# Patient Record
Sex: Female | Born: 1995 | Race: Black or African American | Hispanic: No | Marital: Married | Smoking: Never smoker
Health system: Southern US, Community
[De-identification: ages and names within clinical notes are randomized; demographics above are authoritative.]

## PROBLEM LIST (undated history)

## (undated) ENCOUNTER — Inpatient Hospital Stay (HOSPITAL_COMMUNITY): Payer: Self-pay

## (undated) DIAGNOSIS — D649 Anemia, unspecified: Secondary | ICD-10-CM

## (undated) DIAGNOSIS — O139 Gestational [pregnancy-induced] hypertension without significant proteinuria, unspecified trimester: Secondary | ICD-10-CM

## (undated) DIAGNOSIS — D219 Benign neoplasm of connective and other soft tissue, unspecified: Secondary | ICD-10-CM

## (undated) DIAGNOSIS — O2610 Low weight gain in pregnancy, unspecified trimester: Secondary | ICD-10-CM

## (undated) HISTORY — DX: Gestational (pregnancy-induced) hypertension without significant proteinuria, unspecified trimester: O13.9

## (undated) HISTORY — DX: Low weight gain in pregnancy, unspecified trimester: O26.10

---

## 2015-12-01 ENCOUNTER — Ambulatory Visit (HOSPITAL_COMMUNITY)
Admission: EM | Admit: 2015-12-01 | Discharge: 2015-12-01 | Disposition: A | Discharge status: Home / Self Care | Payer: Self-pay | Attending: Family Medicine | Admitting: Family Medicine

## 2015-12-01 ENCOUNTER — Encounter (HOSPITAL_COMMUNITY): Payer: Self-pay | Admitting: Emergency Medicine

## 2015-12-01 DIAGNOSIS — N739 Female pelvic inflammatory disease, unspecified: Secondary | ICD-10-CM | POA: Insufficient documentation

## 2015-12-01 DIAGNOSIS — N898 Other specified noninflammatory disorders of vagina: Secondary | ICD-10-CM | POA: Insufficient documentation

## 2015-12-01 DIAGNOSIS — N390 Urinary tract infection, site not specified: Secondary | ICD-10-CM | POA: Insufficient documentation

## 2015-12-01 LAB — POCT URINALYSIS DIP (DEVICE)
Bilirubin Urine: NEGATIVE
GLUCOSE, UA: NEGATIVE mg/dL
HGB URINE DIPSTICK: NEGATIVE
Ketones, ur: NEGATIVE mg/dL
NITRITE: NEGATIVE
Protein, ur: NEGATIVE mg/dL
Specific Gravity, Urine: 1.02 (ref 1.005–1.030)
UROBILINOGEN UA: 2 mg/dL — AB (ref 0.0–1.0)
pH: 7.5 (ref 5.0–8.0)

## 2015-12-01 LAB — POCT PREGNANCY, URINE: PREG TEST UR: NEGATIVE

## 2015-12-01 MED ORDER — CIPROFLOXACIN HCL 500 MG PO TABS: 500.0000 mg | ORAL_TABLET | Freq: Two times a day (BID) | ORAL | Status: DC

## 2015-12-01 MED ORDER — AZITHROMYCIN 250 MG PO TABS
1000.0000 mg | ORAL_TABLET | Freq: Once | ORAL | Status: AC
  Administered 2015-12-01: 1000 mg via ORAL

## 2015-12-01 MED ORDER — LIDOCAINE HCL (PF) 1 % IJ SOLN
INTRAMUSCULAR | Status: AC
  Filled 2015-12-01: qty 5

## 2015-12-01 MED ORDER — AZITHROMYCIN 250 MG PO TABS
ORAL_TABLET | ORAL | Status: AC
  Filled 2015-12-01: qty 4

## 2015-12-01 MED ORDER — CEFTRIAXONE SODIUM 250 MG IJ SOLR
INTRAMUSCULAR | Status: AC
  Filled 2015-12-01: qty 250

## 2015-12-01 MED ORDER — CEFTRIAXONE SODIUM 250 MG IJ SOLR
250.0000 mg | Freq: Once | INTRAMUSCULAR | Status: AC
  Administered 2015-12-01: 250 mg via INTRAMUSCULAR

## 2015-12-01 MED ORDER — PHENAZOPYRIDINE HCL 200 MG PO TABS: 200.0000 mg | ORAL_TABLET | Freq: Three times a day (TID) | ORAL | Status: DC

## 2015-12-01 NOTE — ED Provider Notes (Signed)
CSN: [id number]     Arrival date & time 12/01/15  1459 History   None    Chief Complaint  Patient presents with  . Vaginal Discharge   (Consider location/radiation/quality/duration/timing/severity/associated sxs/prior Treatment) HPI Comments: Patient states she has been having pain and tingling in her private area and discharge.  She states she had unprotected sex a week ago.  She states she noticed the discomfort after using bathroom 3 days ago.   Patient is a 20 y.o. female presenting with vaginal discharge. The history is provided by the patient.  Vaginal Discharge Quality:  Bloody and mucopurulent Severity:  Moderate Onset quality:  Sudden Duration:  3 days Timing:  Constant Progression:  Worsening Chronicity:  New Context: after intercourse and during urination   Relieved by:  Nothing Worsened by:  Nothing tried Ineffective treatments:  None tried   History reviewed. No pertinent past medical history. History reviewed. No pertinent past surgical history. No family history on file. Social History  Substance Use Topics  . Smoking status: Never Smoker   . Smokeless tobacco: None  . Alcohol Use: No   OB History    No data available     Review of Systems  Constitutional: Negative.   HENT: Negative.   Eyes: Negative.   Respiratory: Negative.   Cardiovascular: Negative.   Endocrine: Negative.   Genitourinary: Positive for vaginal discharge.  Musculoskeletal: Negative.   Skin: Negative.   Allergic/Immunologic: Negative.   Neurological: Negative.   Hematological: Negative.   Psychiatric/Behavioral: Negative.     Allergies  Review of patient's allergies indicates not on file.  Home Medications   Prior to Admission medications   Not on File   Meds Ordered and Administered this Visit  Medications - No data to display  BP 115/75 mmHg  Pulse 95  Temp(Src) 97.8 F (36.6 C)  Resp 14  SpO2 97%  LMP 11/10/2015 No data found.   Physical Exam   Constitutional: She is oriented to person, place, and time. She appears well-developed and well-nourished.  HENT:  Head: Normocephalic and atraumatic.  Right Ear: External ear normal.  Left Ear: External ear normal.  Mouth/Throat: Oropharynx is clear and moist.  Eyes: Conjunctivae are normal. Pupils are equal, round, and reactive to light.  Neck: Normal range of motion.  Cardiovascular: Normal rate, regular rhythm and normal heart sounds.   Pulmonary/Chest: Effort normal and breath sounds normal.  Abdominal: Soft. Bowel sounds are normal.  Genitourinary: Vaginal discharge found.  BUS normal.  Vaginal vault with brown foul smelling discharge.  Exam was difficult due to patient anxiety  And no chandallier sign but c/o discomfort with bimanual and speculum exam.  Musculoskeletal: Normal range of motion.  Neurological: She is alert and oriented to person, place, and time.    ED Course  Procedures (including critical care time)  Labs Review Labs Reviewed - No data to display  Imaging Review No results found.   Visual Acuity Review  Right Eye Distance:   Left Eye Distance:   Bilateral Distance:    Right Eye Near:   Left Eye Near:    Bilateral Near:         MDM  Vaginal Discharge PID UTI  Rocephin 250mg  IM Zithromax 1000mg  po now.  Tx UTI with cipro 500mg  po bid x 7 days GC/Chlamydia Wet Prep pending UA and UA preg    Lysbeth Penner, FNP 12/01/15 [phone]

## 2015-12-01 NOTE — Discharge Instructions (Signed)
Antibiotic Medicine °Antibiotic medicines are used to treat infections caused by bacteria. They work by hurting or killing the germs that are making you sick. °HOW WILL MY MEDICINE BE PICKED? °There are many kinds of antibiotic medicines. To help your doctor pick one, tell your doctor if: °· You have any allergies. °· You are pregnant or plan to get pregnant. °· You are breastfeeding. °· You are taking any medicines. These include over-the-counter medicines, prescription medicines, and herbal remedies. °· You have a medical condition or problem. °If you have questions about why your medicine was picked, ask. °FOR HOW LONG SHOULD I TAKE MY MEDICINE? °Take your medicine for as long as your doctor tells you to. Do not stop taking it when you feel better. If you stop taking it too soon: °· You may start to feel sick again. °· Your infection may get harder to treat. °· New problems may develop. °WHAT IF I MISS A DOSE? °Try not to miss any doses of antibiotic medicine. If you miss a dose: °· Take the dose as soon as you can. °· If you are taking 2 doses a day, take the next dose in 5 to 6 hours. °· If you are taking 3 or more doses a day, take the next dose in 2 to 4 hours. Then go back to the normal schedule. °If you cannot take a missed dose, take the next dose on time. Then take the missed dose after you have taken all the doses as told by your doctor, as if you had one more dose left. °DOES THIS MEDICINE AFFECT BIRTH CONTROL? °Birth control pills may not work while you are on antibiotic medicines. If you are taking birth control pills, keep taking them as usual. Use a second form of birth control, such as a condom. Keep using the second form of birth control until you are finished with your current 1 month cycle of birth control pills. °GET HELP IF: °· You get worse. °· You do not feel better a few days after starting the medicine. °· You throw up (vomit). °· There are white patches in your mouth. °· You have new  joint pain after starting the medicine. °· You have new muscle aches after starting the medicine. °· You had a fever before starting the medicine, and it comes back. °· You have any symptoms of an allergic reaction, such as an itchy rash. If this happens, stop taking the medicine. °GET HELP RIGHT AWAY IF: °· Your pee (urine) turns dark or becomes blood-colored. °· Your skin turns yellow. °· You bruise or bleed easily. °· You have very bad watery poop (diarrhea) and cramps in your belly (abdomen). °· You have a very bad headache. °· You have signs of a very bad allergic reaction, such as: °¨ Trouble breathing. °¨ Wheezing. °¨ Swelling of the lips, tongue, or face. °¨ Fainting. °¨ Blisters on the skin or in the mouth. °If you have signs of a very bad allergic reaction, stop taking the antibiotic medicine right away. °  °This information is not intended to replace advice given to you by your health care provider. Make sure you discuss any questions you have with your health care provider. °  °Document Released: 04/29/2008 Document Revised: 04/11/2015 Document Reviewed: 12/06/2014 °Elsevier Interactive Patient Education ©2016 Elsevier Inc. ° °

## 2015-12-01 NOTE — ED Notes (Signed)
Waiting 15 min. Due to antibiotic injection

## 2015-12-01 NOTE — ED Notes (Signed)
Pt. Stated, I have vaginal something going on.

## 2015-12-10 LAB — CERVICOVAGINAL ANCILLARY ONLY
Chlamydia: NEGATIVE
Neisseria Gonorrhea: NEGATIVE

## 2015-12-11 LAB — CERVICOVAGINAL ANCILLARY ONLY: Wet Prep (BD Affirm): POSITIVE — AB

## 2015-12-13 ENCOUNTER — Telehealth (HOSPITAL_COMMUNITY): Payer: Self-pay | Admitting: Emergency Medicine

## 2015-12-13 ENCOUNTER — Telehealth: Payer: Self-pay | Admitting: Internal Medicine

## 2015-12-13 NOTE — ED Notes (Signed)
LM on pt's VM [phone] Need to give lab results from recent visit on 4/29 Also let pt know labs can be obtained from [facility]  Per Dr. Valere Dross,  Clinical staff, please let patient know that test for candida (yeast) was positive.  Can send rx for fluconazole 150 mg po qday x 1 day, repeat dose in 3 days, #2 tabs no refill if having symptoms of vaginal irritation/discharge. Recheck for further evaluation if symptoms persist. LM

## 2015-12-13 NOTE — ED Notes (Signed)
Please let patient know that test for candida (yeast) was positive.  Can send rx for fluconazole 150 mg po qday x 1 day, repeat dose in 3 days, #2 tabs no refill if having symptoms of vaginal irritation/discharge. Recheck for further evaluation if symptoms persist.  LM  Sherlene Shams, MD 12/13/15 [phone]

## 2015-12-13 NOTE — ED Notes (Signed)
Pt called back Notified of recent lab results from visit 4/29 Pt ID'd properly... Reports feeling better and sx have subsided and no longer has vag itching  Per Dr. Valere Dross,  Clinical staff, please let patient know that test for candida (yeast) was positive.  Can send rx for fluconazole 150 mg po qday x 1 day, repeat dose in 3 days, #2 tabs no refill if having symptoms of vaginal irritation/discharge. Recheck for further evaluation if symptoms persist. LM  Adv pt if sx are not getting better to return  Pt verb understanding

## 2015-12-14 ENCOUNTER — Telehealth (HOSPITAL_COMMUNITY): Payer: Self-pay | Admitting: Emergency Medicine

## 2015-12-14 MED ORDER — FLUCONAZOLE 150 MG PO TABS: 150.0000 mg | ORAL_TABLET | Freq: Every day | ORAL | Status: DC

## 2015-12-14 NOTE — ED Notes (Signed)
Pt called today stating her sx came back today for vag itching after speaking w/her yest and she reported feeling better than.  Per her request, E-Rx medication to [facility])  Per Dr. Valere Dross,  Clinical staff, please let patient know that test for candida (yeast) was positive.  Can send rx for fluconazole 150 mg po qday x 1 day, repeat dose in 3 days, #2 tabs no refill if having symptoms of vaginal irritation/discharge. Recheck for further evaluation if symptoms persist. LM

## 2016-02-10 ENCOUNTER — Emergency Department (HOSPITAL_COMMUNITY)
Admission: EM | Admit: 2016-02-10 | Discharge: 2016-02-11 | Disposition: A | Discharge status: Home / Self Care | Payer: Self-pay | Attending: Physician Assistant | Admitting: Physician Assistant

## 2016-02-10 ENCOUNTER — Encounter (HOSPITAL_COMMUNITY): Payer: Self-pay | Admitting: Emergency Medicine

## 2016-02-10 ENCOUNTER — Emergency Department (HOSPITAL_COMMUNITY): Payer: Self-pay

## 2016-02-10 DIAGNOSIS — N83519 Torsion of ovary and ovarian pedicle, unspecified side: Secondary | ICD-10-CM

## 2016-02-10 DIAGNOSIS — D259 Leiomyoma of uterus, unspecified: Secondary | ICD-10-CM | POA: Insufficient documentation

## 2016-02-10 HISTORY — DX: Benign neoplasm of connective and other soft tissue, unspecified: D21.9

## 2016-02-10 LAB — URINALYSIS, ROUTINE W REFLEX MICROSCOPIC
BILIRUBIN URINE: NEGATIVE
Glucose, UA: NEGATIVE mg/dL
Hgb urine dipstick: NEGATIVE
KETONES UR: NEGATIVE mg/dL
Leukocytes, UA: NEGATIVE
NITRITE: NEGATIVE
PROTEIN: NEGATIVE mg/dL
Specific Gravity, Urine: 1.013 (ref 1.005–1.030)
pH: 7.5 (ref 5.0–8.0)

## 2016-02-10 LAB — COMPREHENSIVE METABOLIC PANEL
ALBUMIN: 3.8 g/dL (ref 3.5–5.0)
ALK PHOS: 33 U/L — AB (ref 38–126)
ALT: 16 U/L (ref 14–54)
ANION GAP: 7 (ref 5–15)
AST: 24 U/L (ref 15–41)
BUN: 10 mg/dL (ref 6–20)
CALCIUM: 9.5 mg/dL (ref 8.9–10.3)
CO2: 25 mmol/L (ref 22–32)
Chloride: 105 mmol/L (ref 101–111)
Creatinine, Ser: 0.77 mg/dL (ref 0.44–1.00)
GFR calc non Af Amer: >60 mL/min (ref >60)
GLUCOSE: 94 mg/dL (ref 65–99)
POTASSIUM: 3.4 mmol/L — AB (ref 3.5–5.1)
SODIUM: 137 mmol/L (ref 135–145)
TOTAL PROTEIN: 7.3 g/dL (ref 6.5–8.1)
Total Bilirubin: 0.7 mg/dL (ref 0.3–1.2)

## 2016-02-10 LAB — CBC
HCT: 40.7 % (ref 36.0–46.0)
HEMOGLOBIN: 13.6 g/dL (ref 12.0–15.0)
MCH: 29.8 pg (ref 26.0–34.0)
MCHC: 33.4 g/dL (ref 30.0–36.0)
MCV: 89.1 fL (ref 78.0–100.0)
PLATELETS: 247 10*3/uL (ref 150–400)
RBC: 4.57 MIL/uL (ref 3.87–5.11)
RDW: 14.2 % (ref 11.5–15.5)
WBC: 5.6 10*3/uL (ref 4.0–10.5)

## 2016-02-10 LAB — WET PREP, GENITAL
Clue Cells Wet Prep HPF POC: NONE SEEN
Trich, Wet Prep: NONE SEEN
Yeast Wet Prep HPF POC: NONE SEEN

## 2016-02-10 LAB — I-STAT BETA HCG BLOOD, ED (MC, WL, AP ONLY)

## 2016-02-10 LAB — LIPASE, BLOOD: LIPASE: 29 U/L (ref 11–51)

## 2016-02-10 MED ORDER — IOPAMIDOL (ISOVUE-300) INJECTION 61%
INTRAVENOUS | Status: AC
  Administered 2016-02-10: 100 mL
  Filled 2016-02-10: qty 100

## 2016-02-10 MED ORDER — KETOROLAC TROMETHAMINE 30 MG/ML IJ SOLN
30.0000 mg | Freq: Once | INTRAMUSCULAR | Status: AC
  Administered 2016-02-10: 30 mg via INTRAVENOUS
  Filled 2016-02-10: qty 1

## 2016-02-10 NOTE — ED Provider Notes (Signed)
CSN: [id number]     Arrival date & time 02/10/16  1826 History   First MD Initiated Contact with Patient 02/10/16 2030     Chief Complaint  Patient presents with  . Abdominal Pain  . Nausea     (Consider location/radiation/quality/duration/timing/severity/associated sxs/prior Treatment) HPI  Patient has a history of uterine fibroids but otherwise healthy presents to the ER for evaluation of RLQ abdominal pain. She also has not had he menstrual cycle for the past two months and she typically is very regular. She has had normal amount of white vaginal discharge. She denies having back pain, pelvic pain, chest pain, SOB, vomiting or diarrhea. She has been experiencing nausea. Her symptoms all started today. She has never had an [place] to evaluate fibroids, per pt. He has not had fevers, dysuria, sore throat, joint pains. No hx of abdominal surgery.  Past Medical History  Diagnosis Date  . Fibroids    History reviewed. No pertinent past surgical history. No family history on file. Social History  Substance Use Topics  . Smoking status: Never Smoker   . Smokeless tobacco: None  . Alcohol Use: No   OB History    No data available     Review of Systems  Review of Systems All other systems negative except as documented in the HPI. All pertinent positives and negatives as reviewed in the HPI.   Allergies  Review of patient's allergies indicates no known allergies.  Home Medications   Prior to Admission medications   Not on File   BP 110/55 mmHg  Pulse 70  Temp(Src) 98.6 F (37 C)  Resp 18  SpO2 99%  LMP 12/06/2015 Physical Exam  Constitutional: She appears well-developed and well-nourished. No distress.  HENT:  Head: Normocephalic and atraumatic.  Right Ear: Tympanic membrane and ear canal normal.  Left Ear: Tympanic membrane and ear canal normal.  Nose: Nose normal.  Mouth/Throat: Uvula is midline, oropharynx is clear and moist and mucous membranes are normal.  Eyes:  Pupils are equal, round, and reactive to light.  Neck: Normal range of motion. Neck supple.  Cardiovascular: Normal rate and regular rhythm.   Pulmonary/Chest: Effort normal.  Abdominal: Soft. Bowel sounds are normal. She exhibits no distension. There is tenderness in the right lower quadrant. There is no rebound, no guarding, no CVA tenderness and negative Murphy's sign.    No signs of abdominal distention  Genitourinary: Uterus normal. There is no rash or tenderness on the right labia. There is no rash or tenderness on the left labia. Cervix exhibits no motion tenderness. Right adnexum displays no mass and no tenderness. Left adnexum displays no mass and no tenderness. No tenderness or bleeding in the vagina. No foreign body around the vagina. Vaginal discharge (white) found.  Musculoskeletal:  No LE swelling  Neurological: She is alert.  Acting at baseline  Skin: Skin is warm and dry. No rash noted.  Nursing note and vitals reviewed.   ED Course  Procedures (including critical care time) Labs Review Labs Reviewed  WET PREP, GENITAL - Abnormal; Notable for the following:    WBC, Wet Prep HPF POC MANY (*)    All other components within normal limits  COMPREHENSIVE METABOLIC PANEL - Abnormal; Notable for the following:    Potassium 3.4 (*)    Alkaline Phosphatase 33 (*)    All other components within normal limits  LIPASE, BLOOD  CBC  URINALYSIS, ROUTINE W REFLEX MICROSCOPIC (NOT AT [facility])  I-STAT BETA HCG BLOOD, ED (  [facility], [facility], [facility] ONLY)  GC/CHLAMYDIA PROBE AMP ([facility]) NOT AT [facility]    Imaging Review US Transvaginal Non-ob  02/11/2016  CLINICAL DATA:  20 year old female with left lower quadrant abdominal pain EXAM: TRANSABDOMINAL AND TRANSVAGINAL ULTRASOUND OF PELVIS DOPPLER ULTRASOUND OF OVARIES TECHNIQUE: Both transabdominal and transvaginal ultrasound examinations of the pelvis were performed. Transabdominal technique was performed for global imaging of the pelvis including  uterus, ovaries, adnexal regions, and pelvic cul-de-sac. It was necessary to proceed with endovaginal exam following the transabdominal exam to visualize the endometrium and the ovaries. Color and duplex Doppler ultrasound was utilized to evaluate blood flow to the ovaries. COMPARISON:  CT dated 02/10/2016 FINDINGS: Uterus Measurements: 13.8 x 7.3 x 7.4 cm. There are multiple uterine fibroids some of which are partially calcified. The largest fibroid measures 9.6 x 6.9 x 10.0 cm. This fibroid is exophytic arising from the uterine fundus. There is a 2.8 x 2.7 x 2.7 cm rim calcified mural fibroid in the anterior uterine body. A 4.1 x 4.0 x 4.2 cm posterior uterine body fibroid noted. It is difficult to determine whether these fibroids have submucosal components. Endometrium Thickness: 12 mm.  No focal abnormality visualized. Right ovary Measurements: 5.6 x 2.5 x 3.9 cm. Normal appearance/no adnexal mass. Left ovary Measurements: 3.4 x 1.6 x 3.5 cm. Normal appearance/no adnexal mass. Pulsed Doppler evaluation of both ovaries demonstrates normal low-resistance arterial and venous waveforms. Other findings Small free fluid within the pelvis. IMPRESSION: Heterogeneous fibroid uterus. Unremarkable ovaries. Electronically Signed   By: Anner Crete M.D.   On: 02/11/2016 00:32   US Pelvis Complete  02/11/2016  CLINICAL DATA:  19 year old female with left lower quadrant abdominal pain EXAM: TRANSABDOMINAL AND TRANSVAGINAL ULTRASOUND OF PELVIS DOPPLER ULTRASOUND OF OVARIES TECHNIQUE: Both transabdominal and transvaginal ultrasound examinations of the pelvis were performed. Transabdominal technique was performed for global imaging of the pelvis including uterus, ovaries, adnexal regions, and pelvic cul-de-sac. It was necessary to proceed with endovaginal exam following the transabdominal exam to visualize the endometrium and the ovaries. Color and duplex Doppler ultrasound was utilized to evaluate blood flow to the  ovaries. COMPARISON:  CT dated 02/10/2016 FINDINGS: Uterus Measurements: 13.8 x 7.3 x 7.4 cm. There are multiple uterine fibroids some of which are partially calcified. The largest fibroid measures 9.6 x 6.9 x 10.0 cm. This fibroid is exophytic arising from the uterine fundus. There is a 2.8 x 2.7 x 2.7 cm rim calcified mural fibroid in the anterior uterine body. A 4.1 x 4.0 x 4.2 cm posterior uterine body fibroid noted. It is difficult to determine whether these fibroids have submucosal components. Endometrium Thickness: 12 mm.  No focal abnormality visualized. Right ovary Measurements: 5.6 x 2.5 x 3.9 cm. Normal appearance/no adnexal mass. Left ovary Measurements: 3.4 x 1.6 x 3.5 cm. Normal appearance/no adnexal mass. Pulsed Doppler evaluation of both ovaries demonstrates normal low-resistance arterial and venous waveforms. Other findings Small free fluid within the pelvis. IMPRESSION: Heterogeneous fibroid uterus. Unremarkable ovaries. Electronically Signed   By: Anner Crete M.D.   On: 02/11/2016 00:32   Ct Abdomen Pelvis W Contrast  02/10/2016  CLINICAL DATA:  Right lower quadrant and suprapubic abdominal pain. EXAM: CT ABDOMEN AND PELVIS WITH CONTRAST TECHNIQUE: Multidetector CT imaging of the abdomen and pelvis was performed using the standard protocol following bolus administration of intravenous contrast. CONTRAST:  165mL ISOVUE-300 IOPAMIDOL (ISOVUE-300) INJECTION 61% COMPARISON:  None. FINDINGS: The lung bases are clear. The liver, spleen, gallbladder, pancreas, adrenal glands, kidneys, abdominal  aorta, inferior vena cava, and retroperitoneal lymph nodes are unremarkable. Stomach, small bowel, and colon are not abnormally distended. No free air or free fluid in the abdomen. Small umbilical hernia containing fat. Pelvis: The appendix is normal. Small amount of free fluid in the pelvis is likely physiologic. Diffuse uterine enlargement with multiple uterine nodules, many with calcification. Appearance  is consistent with multiple uterine fibroids. Low-attenuation within some of the fibroids may indicate necrosis. Endometrium is somewhat prominent. Consider ultrasound for further evaluation. Bladder is decompressed. No pelvic lymphadenopathy. IMPRESSION: Multiple uterine fibroids, some with calcification. Prominence of the endometrium. Suggest ultrasound of the pelvis for further evaluation. Probable physiologic free fluid in the pelvis. Appendix is normal. Electronically Signed   By: Lucienne Capers M.D.   On: 02/10/2016 22:54   [place] Art/ven Flow Abd Pelv Doppler  02/11/2016  CLINICAL DATA:  20 year old female with left lower quadrant abdominal pain EXAM: TRANSABDOMINAL AND TRANSVAGINAL ULTRASOUND OF PELVIS DOPPLER ULTRASOUND OF OVARIES TECHNIQUE: Both transabdominal and transvaginal ultrasound examinations of the pelvis were performed. Transabdominal technique was performed for global imaging of the pelvis including uterus, ovaries, adnexal regions, and pelvic cul-de-sac. It was necessary to proceed with endovaginal exam following the transabdominal exam to visualize the endometrium and the ovaries. Color and duplex Doppler ultrasound was utilized to evaluate blood flow to the ovaries. COMPARISON:  CT dated 02/10/2016 FINDINGS: Uterus Measurements: 13.8 x 7.3 x 7.4 cm. There are multiple uterine fibroids some of which are partially calcified. The largest fibroid measures 9.6 x 6.9 x 10.0 cm. This fibroid is exophytic arising from the uterine fundus. There is a 2.8 x 2.7 x 2.7 cm rim calcified mural fibroid in the anterior uterine body. A 4.1 x 4.0 x 4.2 cm posterior uterine body fibroid noted. It is difficult to determine whether these fibroids have submucosal components. Endometrium Thickness: 12 mm.  No focal abnormality visualized. Right ovary Measurements: 5.6 x 2.5 x 3.9 cm. Normal appearance/no adnexal mass. Left ovary Measurements: 3.4 x 1.6 x 3.5 cm. Normal appearance/no adnexal mass. Pulsed Doppler  evaluation of both ovaries demonstrates normal low-resistance arterial and venous waveforms. Other findings Small free fluid within the pelvis. IMPRESSION: Heterogeneous fibroid uterus. Unremarkable ovaries. Electronically Signed   By: Anner Crete M.D.   On: 02/11/2016 00:32   I have personally reviewed and evaluated these images and lab results as part of my medical decision-making.   EKG Interpretation None      MDM   Final diagnoses:  Uterine leiomyoma, unspecified location    Patient has uncomplicated uterine fibroids, [facility], this is likely the source of her pain, she does not have torsion, PID, and her CT abd/pelv shows no acute findings.. She has many white blood cells on wet prep, given IM Rocephin and azithromycin to treat in case GC comes back positive.  Otherwise blood work is unremarkable, neg urine preg. Normal UA.  Patients pain controlled with IV Toradol.   Medications  cefTRIAXone (ROCEPHIN) injection 250 mg (not administered)  azithromycin (ZITHROMAX) tablet 1,000 mg (not administered)  ondansetron (ZOFRAN-ODT) disintegrating tablet 4 mg (not administered)  iopamidol (ISOVUE-300) 61 % injection (100 mLs  Contrast Given 02/10/16 2223)  ketorolac (TORADOL) 30 MG/ML injection 30 mg (30 mg Intravenous Given 02/10/16 2304)    I discussed results, diagnoses and plan with Ornela Mousseau. They voice there understanding and questions were answered. We discussed follow-up recommendations and return precautions.     Delos Haring, PA-C 02/11/16 [phone]  Courteney Julio Alm, MD 02/11/16 2310

## 2016-02-10 NOTE — ED Notes (Signed)
Pt c/o abdominal pain and nausea onset today. Pt reports that she has not had her period in two months.

## 2016-02-11 LAB — GC/CHLAMYDIA PROBE AMP (~~LOC~~) NOT AT ARMC
Chlamydia: NEGATIVE
NEISSERIA GONORRHEA: NEGATIVE

## 2016-02-11 MED ORDER — ONDANSETRON 4 MG PO TBDP
4.0000 mg | ORAL_TABLET | Freq: Once | ORAL | Status: AC
  Administered 2016-02-11: 4 mg via ORAL
  Filled 2016-02-11: qty 1

## 2016-02-11 MED ORDER — CEFTRIAXONE SODIUM 250 MG IJ SOLR
250.0000 mg | Freq: Once | INTRAMUSCULAR | Status: AC
  Administered 2016-02-11: 250 mg via INTRAMUSCULAR
  Filled 2016-02-11: qty 250

## 2016-02-11 MED ORDER — AZITHROMYCIN 250 MG PO TABS
1000.0000 mg | ORAL_TABLET | Freq: Once | ORAL | Status: AC
  Administered 2016-02-11: 1000 mg via ORAL
  Filled 2016-02-11: qty 4

## 2016-02-11 NOTE — Discharge Instructions (Signed)
Uterine Fibroids Uterine fibroids are tissue masses (tumors) that can develop in the womb (uterus). They are also called leiomyomas. This type of tumor is not cancerous (benign) and does not spread to other parts of the body outside of the pelvic area, which is between the hip bones. Occasionally, fibroids may develop in the fallopian tubes, in the cervix, or on the support structures (ligaments) that surround the uterus. You can have one or many fibroids. Fibroids can vary in size, weight, and where they grow in the uterus. Some can become quite large. Most fibroids do not require medical treatment. CAUSES A fibroid can develop when a single uterine cell keeps growing (replicating). Most cells in the human body have a control mechanism that keeps them from replicating without control. SIGNS AND SYMPTOMS Symptoms may include:   Heavy bleeding during your period.  Bleeding or spotting between periods.  Pelvic pain and pressure.  Bladder problems, such as needing to urinate more often (urinary frequency) or urgently.  Inability to reproduce offspring (infertility).  Miscarriages. DIAGNOSIS Uterine fibroids are diagnosed through a physical exam. Your health care provider may feel the lumpy tumors during a pelvic exam. Ultrasonography and an MRI may be done to determine the size, location, and number of fibroids. TREATMENT Treatment may include:  Watchful waiting. This involves getting the fibroid checked by your health care provider to see if it grows or shrinks. Follow your health care provider's recommendations for how often to have this checked.  Hormone medicines. These can be taken by mouth or given through an intrauterine device (IUD).  Surgery.  Removing the fibroids (myomectomy) or the uterus (hysterectomy).  Removing blood supply to the fibroids (uterine artery embolization). If fibroids interfere with your fertility and you want to become pregnant, your health care provider  may recommend having the fibroids removed.  HOME CARE INSTRUCTIONS  Keep all follow-up visits as directed by your health care provider. This is important.  Take medicines only as directed by your health care provider.  If you were prescribed a hormone treatment, take the hormone medicines exactly as directed.  Do not take aspirin, because it can cause bleeding.  Ask your health care provider about taking iron pills and increasing the amount of dark green, leafy vegetables in your diet. These actions can help to boost your blood iron levels, which may be affected by heavy menstrual bleeding.  Pay close attention to your period and tell your health care provider about any changes, such as:  Increased blood flow that requires you to use more pads or tampons than usual per month.  A change in the number of days that your period lasts per month.  A change in symptoms that are associated with your period, such as abdominal cramping or back pain. SEEK MEDICAL CARE IF:  You have pelvic pain, back pain, or abdominal cramps that cannot be controlled with medicines.  You have an increase in bleeding between and during periods.  You soak tampons or pads in a half hour or less.  You feel lightheaded, extra tired, or weak. SEEK IMMEDIATE MEDICAL CARE IF:  You faint.  You have a sudden increase in pelvic pain.   This information is not intended to replace advice given to you by your health care provider. Make sure you discuss any questions you have with your health care provider.   Document Released: 07/18/2000 Document Revised: 08/11/2014 Document Reviewed: 01/17/2014 Elsevier Interactive Patient Education 2016 [employer] Inc.  

## 2016-02-11 NOTE — ED Notes (Signed)
Pt stable, ambulatory, states understanding of discharge instructions 

## 2016-02-11 NOTE — ED Notes (Signed)
Pt provided with a meal, okay'd by PA

## 2016-05-19 ENCOUNTER — Encounter (HOSPITAL_COMMUNITY): Payer: Self-pay | Admitting: Emergency Medicine

## 2016-05-19 ENCOUNTER — Ambulatory Visit (HOSPITAL_COMMUNITY)
Admission: EM | Admit: 2016-05-19 | Discharge: 2016-05-19 | Disposition: A | Discharge status: Home / Self Care | Payer: Self-pay | Attending: Family Medicine | Admitting: Family Medicine

## 2016-05-19 DIAGNOSIS — K5900 Constipation, unspecified: Secondary | ICD-10-CM

## 2016-05-19 DIAGNOSIS — R110 Nausea: Secondary | ICD-10-CM

## 2016-05-19 DIAGNOSIS — Z3490 Encounter for supervision of normal pregnancy, unspecified, unspecified trimester: Secondary | ICD-10-CM

## 2016-05-19 DIAGNOSIS — Z3201 Encounter for pregnancy test, result positive: Secondary | ICD-10-CM

## 2016-05-19 LAB — POCT PREGNANCY, URINE: PREG TEST UR: POSITIVE — AB

## 2016-05-19 MED ORDER — PRENATAL VITAMIN 27-0.8 MG PO TABS: 1.0000 | ORAL_TABLET | Freq: Every day | ORAL | 12 refills | Status: DC

## 2016-05-19 NOTE — ED Triage Notes (Signed)
Patient presents for Abdominal pain for the last five days. She states no vomiting or diarrhrea. She states she has some constipation.

## 2016-05-19 NOTE — ED Provider Notes (Signed)
[place]    CSN: [id number] Arrival date & time: 05/19/16  [id number]     History   Chief Complaint Chief Complaint  Patient presents with  . Abdominal Pain    HPI Lisa Liu is a 20 y.o. female.   This is a 20 year old woman who comes from the [place]. She presents with abdominal fullness 5 days, missing period For over a month, and breast tenderness.  She has a boyfriend.      Past Medical History:  Diagnosis Date  . Fibroids     There are no active problems to display for this patient.   History reviewed. No pertinent surgical history.  OB History    No data available       Home Medications    Prior to Admission medications   Medication Sig Start Date End Date Taking? Authorizing Provider  Prenatal Vit-Fe Fumarate-FA (PRENATAL VITAMIN) 27-0.8 MG TABS Take 1 tablet by mouth daily. 05/19/16   Robyn Haber, MD    Family History History reviewed. No pertinent family history.  Social History Social History  Substance Use Topics  . Smoking status: Never Smoker  . Smokeless tobacco: Current User  . Alcohol use No     Allergies   Review of patient's allergies indicates no known allergies.   Review of Systems Review of Systems  Constitutional: Negative.   HENT: Negative.   Eyes: Negative.   Respiratory: Negative.   Cardiovascular: Negative.   Gastrointestinal: Positive for abdominal distention, constipation and nausea. Negative for vomiting.  Genitourinary: Negative.   Musculoskeletal: Negative.      Physical Exam Triage Vital Signs ED Triage Vitals  Enc Vitals Group     BP 05/19/16 2046 111/63     Pulse Rate 05/19/16 2046 100     Resp 05/19/16 2046 16     Temp 05/19/16 2046 99 F (37.2 C)     Temp Source 05/19/16 2046 Oral     SpO2 05/19/16 2046 100 %     Weight --      Height --      Head Circumference --      Peak Flow --      Pain Score 05/19/16 2107 7     Pain Loc --      Pain Edu? --      Excl. in  [place]? --    No data found.   Updated Vital Signs BP 111/63 (BP Location: Left Arm)   Pulse 100   Temp 99 F (37.2 C) (Oral)   Resp 16   SpO2 100%   Visual Acuity Right Eye Distance:   Left Eye Distance:   Bilateral Distance:    Right Eye Near:   Left Eye Near:    Bilateral Near:     Physical Exam  Constitutional: She is oriented to person, place, and time. She appears well-developed and well-nourished.  HENT:  Head: Normocephalic and atraumatic.  Right Ear: External ear normal.  Left Ear: External ear normal.  Mouth/Throat: Oropharynx is clear and moist.  Eyes: Conjunctivae are normal. Pupils are equal, round, and reactive to light.  Neck: Normal range of motion. Neck supple.  Cardiovascular: Normal rate, regular rhythm, normal heart sounds and intact distal pulses.   Pulmonary/Chest: Effort normal and breath sounds normal.  Abdominal: Soft. Bowel sounds are normal. There is tenderness.  Patient's fundus measures 18 weeks.  Neurological: She is alert and oriented to person, place, and time.  Skin: Skin is warm and dry.  Nursing note and vitals reviewed.    UC Treatments / Results  Labs (all labs ordered are listed, but only abnormal results are displayed) Labs Reviewed  POCT PREGNANCY, URINE - Abnormal; Notable for the following:       Result Value   Preg Test, Ur POSITIVE (*)    All other components within normal limits    EKG  EKG Interpretation None       Radiology No results found.  Procedures Procedures (including critical care time)  Medications Ordered in UC Medications - No data to display   Initial Impression / Assessment and Plan / UC Course  I have reviewed the triage vital signs and the nursing notes.  Pertinent labs & imaging results that were available during my care of the patient were reviewed by me and considered in my medical decision making (see chart for details).  Clinical Course      Final Clinical Impressions(s) / UC  Diagnoses   Final diagnoses:  Pregnancy, unspecified gestational age    [place] Prescriptions New Prescriptions   PRENATAL VIT-FE FUMARATE-FA (PRENATAL VITAMIN) 27-0.8 MG TABS    Take 1 tablet by mouth daily.     Robyn Haber, MD 05/19/16 2144

## 2016-05-24 ENCOUNTER — Encounter (HOSPITAL_COMMUNITY): Payer: Self-pay | Admitting: Emergency Medicine

## 2016-05-24 ENCOUNTER — Emergency Department (HOSPITAL_COMMUNITY): Payer: Self-pay

## 2016-05-24 ENCOUNTER — Emergency Department (HOSPITAL_COMMUNITY)
Admission: EM | Admit: 2016-05-24 | Discharge: 2016-05-25 | Disposition: A | Discharge status: Home / Self Care | Payer: Self-pay | Attending: Emergency Medicine | Admitting: Emergency Medicine

## 2016-05-24 DIAGNOSIS — R1032 Left lower quadrant pain: Secondary | ICD-10-CM | POA: Insufficient documentation

## 2016-05-24 DIAGNOSIS — O3680X0 Pregnancy with inconclusive fetal viability, not applicable or unspecified: Secondary | ICD-10-CM

## 2016-05-24 DIAGNOSIS — O26891 Other specified pregnancy related conditions, first trimester: Secondary | ICD-10-CM | POA: Insufficient documentation

## 2016-05-24 DIAGNOSIS — Z3A01 Less than 8 weeks gestation of pregnancy: Secondary | ICD-10-CM | POA: Insufficient documentation

## 2016-05-24 DIAGNOSIS — O26899 Other specified pregnancy related conditions, unspecified trimester: Secondary | ICD-10-CM

## 2016-05-24 DIAGNOSIS — R1031 Right lower quadrant pain: Secondary | ICD-10-CM | POA: Insufficient documentation

## 2016-05-24 DIAGNOSIS — N858 Other specified noninflammatory disorders of uterus: Secondary | ICD-10-CM | POA: Insufficient documentation

## 2016-05-24 DIAGNOSIS — O0281 Inappropriate change in quantitative human chorionic gonadotropin (hCG) in early pregnancy: Secondary | ICD-10-CM | POA: Insufficient documentation

## 2016-05-24 DIAGNOSIS — R109 Unspecified abdominal pain: Secondary | ICD-10-CM

## 2016-05-24 LAB — URINALYSIS, ROUTINE W REFLEX MICROSCOPIC
Bilirubin Urine: NEGATIVE
GLUCOSE, UA: NEGATIVE mg/dL
Hgb urine dipstick: NEGATIVE
KETONES UR: NEGATIVE mg/dL
LEUKOCYTES UA: NEGATIVE
Nitrite: NEGATIVE
PROTEIN: NEGATIVE mg/dL
Specific Gravity, Urine: 1.017 (ref 1.005–1.030)
pH: 6.5 (ref 5.0–8.0)

## 2016-05-24 LAB — CBC
HCT: 38.1 % (ref 36.0–46.0)
Hemoglobin: 13.3 g/dL (ref 12.0–15.0)
MCH: 30 pg (ref 26.0–34.0)
MCHC: 34.9 g/dL (ref 30.0–36.0)
MCV: 85.8 fL (ref 78.0–100.0)
PLATELETS: 298 10*3/uL (ref 150–400)
RBC: 4.44 MIL/uL (ref 3.87–5.11)
RDW: 14 % (ref 11.5–15.5)
WBC: 5.3 10*3/uL (ref 4.0–10.5)

## 2016-05-24 LAB — COMPREHENSIVE METABOLIC PANEL
ALK PHOS: 36 U/L — AB (ref 38–126)
ALT: 14 U/L (ref 14–54)
AST: 19 U/L (ref 15–41)
Albumin: 3.8 g/dL (ref 3.5–5.0)
Anion gap: 7 (ref 5–15)
BILIRUBIN TOTAL: 0.6 mg/dL (ref 0.3–1.2)
BUN: 7 mg/dL (ref 6–20)
CALCIUM: 9.6 mg/dL (ref 8.9–10.3)
CO2: 24 mmol/L (ref 22–32)
CREATININE: 0.72 mg/dL (ref 0.44–1.00)
Chloride: 105 mmol/L (ref 101–111)
Glucose, Bld: 96 mg/dL (ref 65–99)
Potassium: 3.6 mmol/L (ref 3.5–5.1)
Sodium: 136 mmol/L (ref 135–145)
TOTAL PROTEIN: 7.4 g/dL (ref 6.5–8.1)

## 2016-05-24 LAB — I-STAT BETA HCG BLOOD, ED (MC, WL, AP ONLY): I-stat hCG, quantitative: >2000 m[IU]/mL — ABNORMAL HIGH (ref <5)

## 2016-05-24 LAB — POC URINE PREG, ED: PREG TEST UR: POSITIVE — AB

## 2016-05-24 LAB — LIPASE, BLOOD: LIPASE: 31 U/L (ref 11–51)

## 2016-05-24 LAB — HCG, QUANTITATIVE, PREGNANCY: HCG, BETA CHAIN, QUANT, S: 7887 m[IU]/mL — AB (ref <5)

## 2016-05-24 NOTE — ED Triage Notes (Signed)
Pt complains of lower abd pain bilaterally for 5 days. Pain is the same that she was seen for at Urgent care but has not gotten any better.

## 2016-05-24 NOTE — ED Notes (Signed)
Unable to doppler HR

## 2016-05-24 NOTE — ED Provider Notes (Signed)
[place] DEPT Provider Note   CSN: [id number] Arrival date & time: 05/24/16  1810     History   Chief Complaint Chief Complaint  Patient presents with  . Abdominal Pain    HPI Lisa Liu is a 20 y.o. female.  HPI Reports 5 days of intermittent cramping abdominal pain. Cramping is mild to moderate in nature. No vaginal bleeding or vaginal discharge. No urinary symptoms, fevers. Does have some nausea and occasional vomiting. Appetite is normal. Went to urgent care 5 days ago and was diagnosed with pregnancy. Patient did not know she was pregnant prior to this. Reports her last menstrual period was September 10.  Past Medical History:  Diagnosis Date  . Fibroids     There are no active problems to display for this patient.   History reviewed. No pertinent surgical history.  OB History    No data available       Home Medications    Prior to Admission medications   Medication Sig Start Date End Date Taking? Authorizing Provider  Prenatal Vit-Fe Fumarate-FA (PRENATAL VITAMIN) 27-0.8 MG TABS Take 1 tablet by mouth daily. 05/19/16   Robyn Haber, MD    Family History No family history on file.  Social History Social History  Substance Use Topics  . Smoking status: Never Smoker  . Smokeless tobacco: Current User  . Alcohol use No     Allergies   Review of patient's allergies indicates no known allergies.   Review of Systems Review of Systems  Constitutional: Negative for fever.  HENT: Negative for sore throat.   Eyes: Negative for visual disturbance.  Respiratory: Negative for cough and shortness of breath.   Cardiovascular: Negative for chest pain.  Gastrointestinal: Positive for abdominal pain, constipation, nausea and vomiting. Negative for diarrhea.  Genitourinary: Negative for difficulty urinating, dysuria, vaginal bleeding and vaginal discharge.  Musculoskeletal: Negative for back pain and neck pain.  Skin: Negative for rash.    Neurological: Negative for syncope and headaches.     Physical Exam Updated Vital Signs BP 112/71   Pulse 88   Temp 98.5 F (36.9 C) (Oral)   Resp 18   Ht 5\' 5"  (1.651 m)   Wt 160 lb (72.6 kg)   LMP 04/13/2016 (Exact Date)   SpO2 100%   BMI 26.63 kg/m   Physical Exam  Constitutional: She is oriented to person, place, and time. She appears well-developed and well-nourished. No distress.  HENT:  Head: Normocephalic and atraumatic.  Eyes: Conjunctivae and EOM are normal.  Neck: Normal range of motion.  Cardiovascular: Normal rate, regular rhythm, normal heart sounds and intact distal pulses.  Exam reveals no gallop and no friction rub.   No murmur heard. Pulmonary/Chest: Effort normal and breath sounds normal. No respiratory distress. She has no wheezes. She has no rales.  Abdominal: Soft. She exhibits mass (suprapubic/uterine). She exhibits no distension. There is tenderness (mild bilateral lower). There is no CVA tenderness, no tenderness at McBurney's point and negative Murphy's sign.  Musculoskeletal: She exhibits no edema or tenderness.  Neurological: She is alert and oriented to person, place, and time.  Skin: Skin is warm and dry. No rash noted. She is not diaphoretic. No erythema.  Nursing note and vitals reviewed.    ED Treatments / Results  Labs (all labs ordered are listed, but only abnormal results are displayed) Labs Reviewed  COMPREHENSIVE METABOLIC PANEL - Abnormal; Notable for the following:       Result Value   Alkaline Phosphatase  36 (*)    All other components within normal limits  URINALYSIS, ROUTINE W REFLEX MICROSCOPIC (NOT AT [facility]) - Abnormal; Notable for the following:    APPearance CLOUDY (*)    All other components within normal limits  HCG, QUANTITATIVE, PREGNANCY - Abnormal; Notable for the following:    hCG, Beta Chain, Quant, S 7,887 (*)    All other components within normal limits  I-STAT BETA HCG BLOOD, ED ([facility], [facility], [facility] ONLY) - Abnormal;  Notable for the following:    I-stat hCG, quantitative >2,000.0 (*)    All other components within normal limits  POC URINE PREG, ED - Abnormal; Notable for the following:    Preg Test, Ur POSITIVE (*)    All other components within normal limits  LIPASE, BLOOD  CBC    EKG  EKG Interpretation None       Radiology US Ob Comp Less 14 Wks  Result Date: 05/24/2016 CLINICAL DATA:  Lower abdominal pain for week. Gestational age by last menstrual period 5 weeks and 6 days. Beta HCG 7,887. History of fibroids. EXAM: OBSTETRIC <14 WK [place] AND TRANSVAGINAL OB US TECHNIQUE: Both transabdominal and transvaginal ultrasound examinations were performed for complete evaluation of the gestation as well as the maternal uterus, adnexal regions, and pelvic cul-de-sac. Transvaginal technique was performed to assess early pregnancy. COMPARISON:  Pelvic ultrasound February 10, 2016 FINDINGS: Intrauterine gestational sac: Present. Yolk sac:  Not present Embryo:  Not present Cardiac Activity: Not present MSD: 8  mm   5 w   4 Subchorionic hemorrhage:  None visualized. Maternal uterus/adnexae: Multiple hypoechoic shadowing uterine masses measure 4.5 cm and were better characterized on prior pelvic ultrasound. 2 x 1.9 x 1.8 cm LEFT corpus luteal cyst. IMPRESSION: Probable early intrauterine gestational sac, but no yolk sac, fetal pole, or cardiac activity yet visualized. Recommend follow-up quantitative B-HCG levels and follow-up US in 14 days to confirm and assess viability. This recommendation follows SRU consensus guidelines: Diagnostic Criteria for Nonviable Pregnancy Early in the First Trimester. [employer] Med [id number]. Leiomyomatosis uterus. Electronically Signed   By: Elon Alas M.D.   On: 05/24/2016 22:45   US Ob Transvaginal  Result Date: 05/24/2016 CLINICAL DATA:  Lower abdominal pain for week. Gestational age by last menstrual period 5 weeks and 6 days. Beta HCG 7,887. History of fibroids. EXAM:  OBSTETRIC <14 WK [place] AND TRANSVAGINAL OB US TECHNIQUE: Both transabdominal and transvaginal ultrasound examinations were performed for complete evaluation of the gestation as well as the maternal uterus, adnexal regions, and pelvic cul-de-sac. Transvaginal technique was performed to assess early pregnancy. COMPARISON:  Pelvic ultrasound February 10, 2016 FINDINGS: Intrauterine gestational sac: Present. Yolk sac:  Not present Embryo:  Not present Cardiac Activity: Not present MSD: 8  mm   5 w   4 Subchorionic hemorrhage:  None visualized. Maternal uterus/adnexae: Multiple hypoechoic shadowing uterine masses measure 4.5 cm and were better characterized on prior pelvic ultrasound. 2 x 1.9 x 1.8 cm LEFT corpus luteal cyst. IMPRESSION: Probable early intrauterine gestational sac, but no yolk sac, fetal pole, or cardiac activity yet visualized. Recommend follow-up quantitative B-HCG levels and follow-up US in 14 days to confirm and assess viability. This recommendation follows SRU consensus guidelines: Diagnostic Criteria for Nonviable Pregnancy Early in the First Trimester. [employer] Med [id number]. Leiomyomatosis uterus. Electronically Signed   By: Elon Alas M.D.   On: 05/24/2016 22:45    Procedures Procedures (including critical care time)  Medications Ordered  in ED Medications - No data to display   Initial Impression / Assessment and Plan / ED Course  I have reviewed the triage vital signs and the nursing notes.  Pertinent labs & imaging results that were available during my care of the patient were reviewed by me and considered in my medical decision making (see chart for details).  Clinical Course   20 year old female presents with 5 days of cramping abdominal pain. Pregnancy test positive. Transvaginal ultrasound shows likely gestational sac, however no yolk sac or fetal pole. She has fibroids present and palpable on abdominal exam, as well as seen on ultrasound. Denies any vaginal  bleeding, vaginal discharge. We'll defer full pelvic exam in this setting to [facility] provider. Urinalysis shows no sign of urinary tract infection. History and exam is not consistent with cholecystitis, appendicitis, diverticulitis. Doubt ovarian torsion. Overall, patient's abdominal exam is benign, low suspicion for ruptured ectopic pregnancy at this time. Discussed with patient that this remains a pregnancy of unknown location, given absence of yolk sac or fetal pole, however it may be early intrauterine pregnancy. Recommended 48-hour follow-up hCG, 2 week [place].  Patient discharged in stable condition with understanding of reasons to return.   Final Clinical Impressions(s) / ED Diagnoses   Final diagnoses:  Pregnancy of unknown anatomic location, intrauterine gestation sac, suspect intrauterine pregnancy    New Prescriptions Discharge Medication List as of 05/24/2016 11:58 PM       Gareth Morgan, MD 05/25/16 [id number]

## 2016-05-24 NOTE — ED Notes (Signed)
Pt stable, ambulatory, states understanding of discharge instructions 

## 2016-05-24 NOTE — ED Notes (Signed)
Pt ambulated to bathroom without difficulty.

## 2016-06-30 ENCOUNTER — Other Ambulatory Visit (INDEPENDENT_AMBULATORY_CARE_PROVIDER_SITE_OTHER): Payer: Self-pay

## 2016-06-30 DIAGNOSIS — Z3481 Encounter for supervision of other normal pregnancy, first trimester: Secondary | ICD-10-CM

## 2016-06-30 LAB — POCT URINALYSIS DIPSTICK
Bilirubin, UA: NEGATIVE
Blood, UA: NEGATIVE
Glucose, UA: NEGATIVE
LEUKOCYTES UA: NEGATIVE
NITRITE UA: NEGATIVE
PH UA: 6
Spec Grav, UA: 1.025
Urobilinogen, UA: 0.2

## 2016-07-01 LAB — OBSTETRIC PANEL
ANTIBODY SCREEN: NEGATIVE
BASOS ABS: 0 {cells}/uL (ref 0–200)
Basophils Relative: 0 %
EOS PCT: 1 %
Eosinophils Absolute: 37 cells/uL (ref 15–500)
HEMATOCRIT: 41.6 % (ref 35.0–45.0)
HEMOGLOBIN: 13.9 g/dL (ref 11.7–15.5)
Hepatitis B Surface Ag: NEGATIVE
Lymphocytes Relative: 41 %
Lymphs Abs: 1517 cells/uL (ref 850–3900)
MCH: 29.7 pg (ref 27.0–33.0)
MCHC: 33.4 g/dL (ref 32.0–36.0)
MCV: 88.9 fL (ref 80.0–100.0)
MONOS PCT: 5 %
MPV: 9.1 fL (ref 7.5–12.5)
Monocytes Absolute: 185 cells/uL — ABNORMAL LOW (ref 200–950)
NEUTROS PCT: 53 %
Neutro Abs: 1961 cells/uL (ref 1500–7800)
Platelets: 251 10*3/uL (ref 140–400)
RBC: 4.68 MIL/uL (ref 3.80–5.10)
RDW: 14.7 % (ref 11.0–15.0)
RH TYPE: POSITIVE
Rubella: 31.9 Index — ABNORMAL HIGH (ref <0.90)
WBC: 3.7 10*3/uL — ABNORMAL LOW (ref 3.8–10.8)

## 2016-07-01 LAB — SICKLE CELL SCREEN: SICKLE CELL SCREEN: NEGATIVE

## 2016-07-01 LAB — HIV ANTIBODY (ROUTINE TESTING W REFLEX): HIV 1&2 Ab, 4th Generation: NONREACTIVE

## 2016-07-02 LAB — CULTURE, OB URINE

## 2016-07-07 ENCOUNTER — Ambulatory Visit (INDEPENDENT_AMBULATORY_CARE_PROVIDER_SITE_OTHER): Payer: Self-pay | Admitting: Internal Medicine

## 2016-07-07 ENCOUNTER — Encounter: Payer: Self-pay | Admitting: Internal Medicine

## 2016-07-07 DIAGNOSIS — Z3400 Encounter for supervision of normal first pregnancy, unspecified trimester: Secondary | ICD-10-CM | POA: Insufficient documentation

## 2016-07-07 DIAGNOSIS — Z3401 Encounter for supervision of normal first pregnancy, first trimester: Secondary | ICD-10-CM

## 2016-07-07 NOTE — Patient Instructions (Addendum)
It was so nice to meet you!  [facility], you should also get your flu shot there.  We will see you back in 4 weeks.  -Dr. Brett Albino

## 2016-07-07 NOTE — Progress Notes (Signed)
Lisa Liu is a 20 y.o. yo G1P0 at [redacted]w[redacted]d who presents for her initial prenatal visit. Pregnancy is not planned, but she is happy about it. She reports feeling well overall. She  is taking PNV. See flow sheet for details.  PMH, POBH, FH, meds, allergies and Social Hx reviewed.  Prenatal Exam: Gen: Well nourished, well developed.  No distress.  Vitals noted. HEENT: Normocephalic, atraumatic.  Neck supple without cervical lymphadenopathy, thyromegaly or thyroid nodules.  Fair dentition. CV: RRR no murmur, gallops or rubs Lungs: CTAB.  Normal respiratory effort without wheezes or rales. Abd: soft, NTND. +BS.  Firm mass palpated in the upper part of the uterus, uterus measuring 18cm because of the mass. GU: Normal external female genitalia without lesions.  Normal vaginal, well rugated without lesions. No vaginal discharge.  Bimanual exam: No adnexal mass or TTP. No CMT.  Uterus size 18cm, but Pt has multiple fibroids on [place]. Ext: No clubbing, cyanosis or edema. Psych: Normal grooming and dress.  Not depressed or anxious appearing.  Normal thought content and process without flight of ideas or looseness of associations.  Assessment & Plan: 1) 20 y.o. yo G1P0 at [redacted]w[redacted]d via LMP doing well.  -Current pregnancy issues include large fibroids present. Discussed with OB fellow and OB attending, who do not recommend any additional measures be taken. Pt is at slightly increased risk of miscarriage. She should get an MFM Anatomy US at 18 weeks instead of the [place] [facility] in case they need to do dopplers to make sure the placenta is not competing with the fibroid for blood flow. Baby is at increased risk of IUGR. -[place] from 10/21 showed probable early intrauterine gestational sac, but no yolk sac, fetal pole, or cardiac activity. Pt also had an US performed at the [facility], but Pt did not have that [place] report. She did have a paper stating that she should receive  routine prenatal care, which would imply that they were able to identify an IUP. I called over to their office but was unable to obtain records. No FHTs were able to be heard today, which may just be secondary to her fibroids. However, will send her for repeat US per the radiologist's recommendations and because we were unable to hear FHTs. Repeat US scheduled for 12/7. -Dating is reliable. -Prenatal labs reviewed and are unremarkable. -Genetic screening offered: declined. -Early glucola is not indicated.  -PHQ-9 and Pregnancy Medical Home forms completed and reviewed.  Bleeding and pain precautions reviewed. Importance of prenatal vitamins reviewed. Follow up in 4 weeks.

## 2016-07-08 ENCOUNTER — Other Ambulatory Visit (HOSPITAL_COMMUNITY): Admission: RE | Admit: 2016-07-08 | Payer: Self-pay | Source: Ambulatory Visit | Admitting: Family Medicine

## 2016-07-08 NOTE — Addendum Note (Signed)
Addended by: Dorna Bloom on: 07/08/2016 12:17 PM   Modules accepted: Orders

## 2016-07-09 ENCOUNTER — Ambulatory Visit: Payer: Self-pay

## 2016-07-09 LAB — CERVICOVAGINAL ANCILLARY ONLY
CHLAMYDIA, DNA PROBE: NEGATIVE
NEISSERIA GONORRHEA: NEGATIVE

## 2016-07-10 ENCOUNTER — Encounter (HOSPITAL_COMMUNITY): Payer: Self-pay | Admitting: Emergency Medicine

## 2016-07-10 ENCOUNTER — Ambulatory Visit (INDEPENDENT_AMBULATORY_CARE_PROVIDER_SITE_OTHER): Payer: Self-pay | Admitting: Student

## 2016-07-10 ENCOUNTER — Encounter: Payer: Self-pay | Admitting: Student

## 2016-07-10 ENCOUNTER — Emergency Department (HOSPITAL_COMMUNITY)
Admission: EM | Admit: 2016-07-10 | Discharge: 2016-07-10 | Disposition: A | Discharge status: Home / Self Care | Payer: Self-pay | Attending: Emergency Medicine | Admitting: Emergency Medicine

## 2016-07-10 ENCOUNTER — Other Ambulatory Visit: Payer: Self-pay | Admitting: Student

## 2016-07-10 VITALS — BP 102/66 | HR 103 | Temp 98.1°F | Wt 168.0 lb

## 2016-07-10 DIAGNOSIS — O021 Missed abortion: Secondary | ICD-10-CM

## 2016-07-10 DIAGNOSIS — R1030 Lower abdominal pain, unspecified: Secondary | ICD-10-CM | POA: Insufficient documentation

## 2016-07-10 DIAGNOSIS — Z3A12 12 weeks gestation of pregnancy: Secondary | ICD-10-CM | POA: Insufficient documentation

## 2016-07-10 DIAGNOSIS — R519 Headache, unspecified: Secondary | ICD-10-CM

## 2016-07-10 DIAGNOSIS — O26891 Other specified pregnancy related conditions, first trimester: Secondary | ICD-10-CM | POA: Insufficient documentation

## 2016-07-10 DIAGNOSIS — R51 Headache: Secondary | ICD-10-CM | POA: Insufficient documentation

## 2016-07-10 LAB — URINALYSIS, ROUTINE W REFLEX MICROSCOPIC
BILIRUBIN URINE: NEGATIVE
Glucose, UA: NEGATIVE mg/dL
Hgb urine dipstick: NEGATIVE
Ketones, ur: NEGATIVE mg/dL
Leukocytes, UA: NEGATIVE
NITRITE: NEGATIVE
Protein, ur: NEGATIVE mg/dL
SPECIFIC GRAVITY, URINE: 1.009 (ref 1.005–1.030)
pH: 6 (ref 5.0–8.0)

## 2016-07-10 LAB — COMPREHENSIVE METABOLIC PANEL
ALK PHOS: 40 U/L (ref 38–126)
ALT: 28 U/L (ref 14–54)
ANION GAP: 9 (ref 5–15)
AST: 25 U/L (ref 15–41)
Albumin: 3.8 g/dL (ref 3.5–5.0)
BILIRUBIN TOTAL: 0.3 mg/dL (ref 0.3–1.2)
BUN: 5 mg/dL — ABNORMAL LOW (ref 6–20)
CALCIUM: 9.8 mg/dL (ref 8.9–10.3)
CO2: 22 mmol/L (ref 22–32)
Chloride: 101 mmol/L (ref 101–111)
Creatinine, Ser: 0.7 mg/dL (ref 0.44–1.00)
Glucose, Bld: 94 mg/dL (ref 65–99)
Potassium: 3.4 mmol/L — ABNORMAL LOW (ref 3.5–5.1)
Sodium: 132 mmol/L — ABNORMAL LOW (ref 135–145)
TOTAL PROTEIN: 7.4 g/dL (ref 6.5–8.1)

## 2016-07-10 LAB — CBC
HCT: 40.7 % (ref 36.0–46.0)
HEMOGLOBIN: 14.2 g/dL (ref 12.0–15.0)
MCH: 30 pg (ref 26.0–34.0)
MCHC: 34.9 g/dL (ref 30.0–36.0)
MCV: 86 fL (ref 78.0–100.0)
Platelets: 278 10*3/uL (ref 150–400)
RBC: 4.73 MIL/uL (ref 3.87–5.11)
RDW: 14.3 % (ref 11.5–15.5)
WBC: 5.5 10*3/uL (ref 4.0–10.5)

## 2016-07-10 LAB — LIPASE, BLOOD: Lipase: 16 U/L (ref 11–51)

## 2016-07-10 LAB — I-STAT BETA HCG BLOOD, ED (MC, WL, AP ONLY)

## 2016-07-10 MED ORDER — SODIUM CHLORIDE 0.9 % IV BOLUS (SEPSIS)
1000.0000 mL | Freq: Once | INTRAVENOUS | Status: AC
  Administered 2016-07-10: 1000 mL via INTRAVENOUS

## 2016-07-10 MED ORDER — ACETAMINOPHEN 325 MG PO TABS
650.0000 mg | ORAL_TABLET | Freq: Once | ORAL | Status: AC
  Administered 2016-07-10: 650 mg via ORAL
  Filled 2016-07-10: qty 2

## 2016-07-10 NOTE — ED Triage Notes (Signed)
Pt states she is [redacted] week pregnant and has had a headache for a week. Pt having abd pain as well. Denies vaginal bleeding. No vomiting.

## 2016-07-10 NOTE — ED Provider Notes (Signed)
[place] DEPT Provider Note   CSN: [id number] Arrival date & time: 07/10/16  1619     History   Chief Complaint Chief Complaint  Patient presents with  . Abdominal Pain  . Headache    HPI Lisa Liu is a 20 y.o. female.  HPI Patient is [id number] [redacted]w[redacted]d female who presents with 1 day of mild abdominal cramping.  No vaginal bleeding or vaginal discharge.  Secondarily, she complains of frontal and bilateral mild headache x 1 week.  Gradual onset.  Similar to previous headaches.  No visual changes, slurred speech, numbness, weakness, gait abnormalities, neck stiffness, CP, or SOB.  She has not taken any medications PTA.  She states she [facility], but is unsure of the results.  Past Medical History:  Diagnosis Date  . Fibroids     Patient Active Problem List   Diagnosis Date Noted  . Supervision of normal first pregnancy 07/07/2016    History reviewed. No pertinent surgical history.  OB History    Gravida Para Term Preterm AB Living   1             SAB TAB Ectopic Multiple Live Births                   Home Medications    Prior to Admission medications   Medication Sig Start Date End Date Taking? Authorizing Provider  Prenatal Vit-Fe Fumarate-FA (PRENATAL VITAMIN) 27-0.8 MG TABS Take 1 tablet by mouth daily. 05/19/16  Yes Robyn Haber, MD    Family History History reviewed. No pertinent family history.  Social History Social History  Substance Use Topics  . Smoking status: Never Smoker  . Smokeless tobacco: Current User  . Alcohol use No     Allergies   Patient has no known allergies.   Review of Systems Review of Systems All other systems negative unless otherwise stated in HPI   Physical Exam Updated Vital Signs BP 124/92   Pulse 104   Temp 98 F (36.7 C) (Oral)   Resp 16   Wt 76.2 kg   LMP 04/13/2016 (Exact Date)   SpO2 100%   BMI 27.96 kg/m   Physical Exam  Constitutional:  She is oriented to person, place, and time. She appears well-developed and well-nourished.  Non-toxic appearance. She does not have a sickly appearance. She does not appear ill.  HENT:  Head: Normocephalic and atraumatic.  Mouth/Throat: Oropharynx is clear and moist.  Eyes: Conjunctivae are normal. Pupils are equal, round, and reactive to light.  Neck: Normal range of motion. Neck supple.  Cardiovascular: Normal rate and regular rhythm.   Pulmonary/Chest: Effort normal and breath sounds normal. No accessory muscle usage or stridor. No respiratory distress. She has no wheezes. She has no rhonchi. She has no rales.  Abdominal: Soft. Bowel sounds are normal. She exhibits no distension. There is no tenderness.  Musculoskeletal: Normal range of motion.  Lymphadenopathy:    She has no cervical adenopathy.  Neurological: She is alert and oriented to person, place, and time.  Cranial nerves grossly intact.  Normal finger to nose.  No pronator drift.  Equal strength and sensation throughout upper and lower extremities. Normal gait.   Skin: Skin is warm and dry.  Psychiatric: She has a normal mood and affect. Her behavior is normal.     ED Treatments / Results  Labs (all labs ordered are listed, but only abnormal results are displayed) Labs Reviewed  COMPREHENSIVE METABOLIC  PANEL - Abnormal; Notable for the following:       Result Value   Sodium 132 (*)    Potassium 3.4 (*)    BUN 5 (*)    All other components within normal limits  URINALYSIS, ROUTINE W REFLEX MICROSCOPIC - Abnormal; Notable for the following:    APPearance HAZY (*)    All other components within normal limits  I-STAT BETA HCG BLOOD, ED ([facility], [facility], [facility] ONLY) - Abnormal; Notable for the following:    I-stat hCG, quantitative >2,000.0 (*)    All other components within normal limits  LIPASE, BLOOD  CBC    EKG  EKG Interpretation None       Radiology No results found.  Procedures Procedures (including critical care  time)  Medications Ordered in ED Medications  sodium chloride 0.9 % bolus 1,000 mL (1,000 mLs Intravenous New Bag/Given 07/10/16 1751)  acetaminophen (TYLENOL) tablet 650 mg (650 mg Oral Given 07/10/16 1748)     Initial Impression / Assessment and Plan / ED Course  I have reviewed the triage vital signs and the nursing notes.  Pertinent labs & imaging results that were available during my care of the patient were reviewed by me and considered in my medical decision making (see chart for details).  Clinical Course    Patient seen by family medicine 12/4 for prenatal care.  [facility] showed likely missed abortion and she is scheduled for D&C.  Spoke with Family Medicine MD, Dr. Cyndia Skeeters, who had at length discussion with her regarding these results and will call her tomorrow for scheduling D&C.  In ED, abdomen is soft and benign.  No vaginal bleeding.  BP normotensive, no focal neurological deficits, low suspicion for eeclampsia, SAH, NPH, or other acute intracranial etiology.  Patient given Tylenol and IV fluids in ED. Evaluation does not show pathology requiring ongoing emergent intervention or admission. Pt is hemodynamically stable and mentating appropriately. Discussed findings/results and plan with patient/guardian, who agrees with plan. All questions answered. Return precautions discussed and outpatient follow up given.   Case has been discussed with Dr. Oleta Mouse who agrees with the above plan for discharge.   Final Clinical Impressions(s) / ED Diagnoses   Final diagnoses:  Lower abdominal pain  Nonintractable headache, unspecified chronicity pattern, unspecified headache type    New Prescriptions New Prescriptions   No medications on file     Gloriann Loan, PA-C 07/10/16 2001    Forde Dandy, MD 07/10/16 [phone]

## 2016-07-10 NOTE — Progress Notes (Signed)
Lisa Liu is a 20 y.o. G1P0 at [redacted]w[redacted]d by 5w [place] who presented to clinic as the same day appointment to discuss about her pregnancy. Patient was accompanied by her aunt.  Patient was seen in clinic for her initial OB visit three days ago. At that time, the FHT was not heard and a repeat ultrasound was ordered per radiologist's recommendation.  Patient had repeat ultrasound at [place] this morning. Per patient, she was directly sent to our clinic but was not told why. She says she was not told about the finding of the ultrasound. She denies vaginal bleeding, abdominal pain or abnormal vaginal discharge.  I called [facility] health Department to follow up on the ultrasound. I talked to Ms. Brookes (RN), who stated that the preliminary on the ultrasound shows pregnancy at [redacted]w[redacted]d without fetal heart activity. Ms. Lanice Schwab said she will have the ultrasound read stat by radiologist so that the results would be faxed to [place] as soon as possible.   I called OB attending at [facility], Dr. Ilda Basset and discussed about this finding. Dr. Ilda Basset suggested scheduling patient for [facility] next week. Per Dr. Ilda Basset, there is no indication for urgent intervention.   After discussing the above with Dr. Andria Frames, who was the preceptor for the day, we have decided to break the inevitable bad news to the patient. With the news, patient "fell" of her chair forward and started crying. There was no head injury or loss of consciousness. She was initially hard to console. Dr. Andria Frames also came in and talked to the patient. Asked if she likes to talk to someone. Patient declined stayed on the floor crying.   Finally, patient got up and sat on the floor with eyes red from crying. With patient's permission, I discussed the plan going forward: -We will schedule an appointment at [facility] hospital for next week for D&C. Once the schedule is made, I will personally call you to let you know about the  time.  -If you have severe abdominal pain like cramping, vaginal bleeding, fever or other symptoms concerning to you, please go to [facility] immediately.  Patient voiced understanding.   Patient's aunt asked if she can be referred tomorrow. I told her, we will try to make it fast but I can't promise. Unfortunately, our referral coordinator is not in the office to help [place] today but we will be able to know about the date and time by tomorrow.   I placed urgent referral to OB/GYN. Patient blood type A positive.   Later in the evening, It came to my attention that patient is in ED. I called ED and talked to [facility], PA who is taking care of the patient and gave the above information over the phone.

## 2016-07-10 NOTE — Progress Notes (Signed)
Progress Notes Date of Service: 07/10/2016 5:36 PM Mercy Riding, MD  Family Medicine    [] Hide copied text [] Hover for attribution information Lisa Liu is a 20 y.o. G1P0 at [redacted]w[redacted]d by 5w [place] who presented to clinic as the same day appointment to discuss about her pregnancy. Patient was accompanied by her aunt.  Patient was seen in clinic for her initial OB visit three days ago. At that time, the FHT was not heard and a repeat ultrasound was ordered per radiologist's recommendation.  Patient had repeat ultrasound at [place] this morning. Per patient, she was directly sent to our clinic but was not told why. She says she was not told about the finding of the ultrasound. She denies vaginal bleeding, abdominal pain or abnormal vaginal discharge.  I called [facility] health Department to follow up on the ultrasound. I talked to Ms. Brookes (RN), who stated that the preliminary on the ultrasound shows pregnancy at [redacted]w[redacted]d without fetal heart activity. Ms. Lanice Schwab said she will have the ultrasound read stat by radiologist so that the results would be faxed to [place] as soon as possible.   I called OB attending at [facility], Dr. Ilda Basset and discussed about this finding. Dr. Ilda Basset suggested scheduling patient for [facility] next week. Per Dr. Ilda Basset, there is no indication for urgent intervention.   After discussing the above with Dr. Andria Frames, who was the preceptor for the day, we have decided to break the inevitable bad news to the patient. With the news, patient "fell" of her chair forward and started crying. There was no head injury or loss of consciousness. She was initially hard to console. Dr. Andria Frames also came in and talked to the patient. Asked if she likes to talk to someone. Patient declined stayed on the floor crying.   Finally, patient got up and sat on the floor with eyes red from crying. With patient's permission, I discussed the plan going  forward: -We will schedule an appointment at [facility] hospital for next week for D&C. Once the schedule is made, I will personally call you to let you know about the time.  -If you have severe abdominal pain like cramping, vaginal bleeding, fever or other symptoms concerning to you, please go to [facility] immediately.  Patient voiced understanding.   Patient's aunt asked if she can be referred tomorrow. I told her, we will try to make it fast but I can't promise. Unfortunately, our referral coordinator is not in the office to help [place] today but we will be able to know about the date and time by tomorrow.   I placed urgent referral to OB/GYN. Patient blood type A positive.   Later in the evening, It came to my attention that patient is in ED. I called ED and talked to [facility], PA who is taking care of the patient and gave the above information over the phone.

## 2016-07-10 NOTE — Patient Instructions (Signed)
I am really sorry about the bad news!  We will call and schedule the appointment at [facility] to have this taken care. I will call you when the schedule is made. However, if you have vaginal bleeding, abdominal pain, fever or other symptoms concerning to you, please go to [facility] hospital immediately.     If we did any lab work today, and the results require attention, either me or my nurse will get in touch with you. If everything is normal, you will get a letter in mail. If you don't hear from [place] in two weeks, please give [place] a call. Otherwise, we look forward to seeing you again at your next visit. If you have any questions or concerns before then, please call the clinic at [phone].   Please bring all your medications to every doctors visit   Sign up for My Chart to have easy access to your labs results, and communication with your Primary care physician.     Please check-out at the front desk before leaving the clinic.

## 2016-07-10 NOTE — Discharge Instructions (Addendum)
Please follow up with your primary care physician.  They will call you tomorrow regarding your D&C.   If you develop severe abdominal pain, fever, or vaginal bleeding please go to the [facility].

## 2016-07-11 ENCOUNTER — Telehealth: Payer: Self-pay | Admitting: Student

## 2016-07-11 NOTE — Telephone Encounter (Signed)
Called to follow up on patient. Patient states "I feel better". She denies new symptoms such as vaginal bleeding, abdominal pain, fever or abnormal looking vaginal discharge. I told patient that the referral to OB/Gyn for D&C has already been ordered. I advised patient that someone will call to notify her about the date and time as soon as we here back from [facility] hospital which may be this afternoon or next week. Meanwhile, I advised patient to call to MAU if she has abdominal pain like cramping, vaginal bleeding, abnormal discharge, fever or other symptoms concerning to her. She voiced understanding and agreed to do as advised. She is appreciative about the call.

## 2016-07-14 ENCOUNTER — Telehealth: Payer: Self-pay

## 2016-07-14 ENCOUNTER — Inpatient Hospital Stay (HOSPITAL_COMMUNITY)
Admission: AD | Admit: 2016-07-14 | Discharge: 2016-07-15 | Disposition: A | Discharge status: Home / Self Care | Payer: Self-pay | Source: Ambulatory Visit | Attending: Family Medicine | Admitting: Family Medicine

## 2016-07-14 DIAGNOSIS — O034 Incomplete spontaneous abortion without complication: Secondary | ICD-10-CM

## 2016-07-14 DIAGNOSIS — O039 Complete or unspecified spontaneous abortion without complication: Secondary | ICD-10-CM | POA: Insufficient documentation

## 2016-07-14 MED ORDER — MISOPROSTOL 200 MCG PO TABS
800.0000 ug | ORAL_TABLET | Freq: Once | ORAL | Status: AC
  Administered 2016-07-14: 800 ug via BUCCAL
  Filled 2016-07-14: qty 4

## 2016-07-14 MED ORDER — TRAMADOL HCL 50 MG PO TABS: 50.0000 mg | ORAL_TABLET | Freq: Four times a day (QID) | ORAL | 0 refills | Status: DC | PRN

## 2016-07-14 MED ORDER — PROMETHAZINE HCL 25 MG PO TABS: 25.0000 mg | ORAL_TABLET | Freq: Four times a day (QID) | ORAL | 1 refills | Status: DC | PRN

## 2016-07-14 MED ORDER — TRAMADOL HCL 50 MG PO TABS
100.0000 mg | ORAL_TABLET | Freq: Once | ORAL | Status: AC
  Administered 2016-07-14: 100 mg via ORAL
  Filled 2016-07-14: qty 2

## 2016-07-14 NOTE — MAU Provider Note (Signed)
History    First Provider Initiated Contact with Patient 07/14/16 2233      Chief Complaint:  Miscarriage   Lisa Liu is  20 y.o. G1P0 Patient's last menstrual period was 04/13/2016 (exact date).. Patient is here for follow up of Diagnosis of missed AB diagnosed 07/10/2016 at [facility] and verified at [facility] 07/13/2016.  She measured [redacted] weeks gestation on those ultrasounds. Plan was made to schedule D&C at [facility] hospital, but was never scheduled. Patient went to [facility] 07/13/2016 for bleeding. Ultrasound confirmed fetal demise patient was told to follow-up at [facility] hospital. Patient return to [place] later during that same night (early on 07/14/2016) after passing tissue. Ultrasound was repeated showing no IUP, 3.1 cm endometrium with no definite products of conception. Patient states bleeding has decreased since then, is now moderate, but she is still having significant cramping. [place] for [facility] this afternoon to discuss scheduling D&C. Was referred to maternity admissions.  ROS Abdomin Pain: Moderate cramping, 8/10 on pain scale Vaginal bleeding: lighter than period.   Passage of clots or tissue: Yes Dizziness: None   Physical Exam   BP 124/68   Pulse 88   Temp 98.5 F (36.9 C) (Oral)   Resp 18   Ht 5' 4.5" (1.638 m)   Wt 166 lb 9.6 oz (75.6 kg)   LMP 04/13/2016 (Exact Date)   BMI 28.16 kg/m  Constitutional: Well-nourished female in no apparent distress. No pallor Neuro: Alert and oriented 4 Cardiovascular: Normal rate Respiratory: Normal effort and rate Abdomen: Soft, nontender Gynecological Exam: exam declined by the patient  Labs: A/POS/-- (11/27 0951) CBC this morning:  WBC 7.1  RBC 4.41  HGB 13.4  HCT 38.6  MCV 88  MCH 30.4  MCHC 34.7  Plt Ct 240  RDW SD 46.3  MPV 9.7     Ultrasound Studies:   No results found.  MAU course/MDM: Ultram  given.  Pain decreased to 5/10 on pain scale. No heavy bleeding in MAU.  - SAB with possible small amount of clots or tissue, but no indication for emergent D&C. Discussed history, exam, labs, ultrasound with Dr. Nehemiah Settle. Patient is a candidate for Cytotec. Discussed management options. Patient strongly prefers Cytotec--Given buccally in MAU. Patient is very concerned about completion of SAB and whether she is at risk for infection. Explained that she has no evidence of infection now, has likely passed most if not all of the tissue and Cytotec is very good at completing the process. Will schedule follow-up appointment 07/18/2016  Early Intrauterine Pregnancy Failure  _x_  Documented intrauterine pregnancy failure less than or equal to [redacted] weeks gestation  _x_  No serious current illness  _x_  Baseline Hgb greater than or equal to 10g/dl  _x_  Patient has easily accessible transportation to the hospital  _x_  Clear preference  _x_  Practitioner/physician deems patient reliable  _x_  Counseling by practitioner or physician  _x_  Patient education by RN  _x_  Consent form signed/Verbal consent given  _NA_  Rho-Gam given by RN if indicated  _x_ Medication dispensed   _x_   Cytotec 800 mcg     __   Intravaginally/Rectally/Bucally by patient at home     _x_  Bucally by RN in MAU  ___  Ibuprofen 600 mg 1 tablet by mouth every 6 hours as needed #30  ___  Oxycodone/acetaminophen 5/325 mg by mouth every 4 to 6 hours as needed  _x__  Phenergan 25 mg by mouth every 4 hours as needed for nausea  ___  Zofran 4 mg PO Q4 PRN  _x__ Ultram  Assessment: SAB, possibly incomplete, hemodynamically stable.  Plan: Discharge home in stable condition. Bleeding and infection precautions Support given.  Follow-up [place] for [place] Follow up on 07/18/2016.   Specialty:  Obstetrics and Gynecology Why:  at 8:40 am for follow-up appointment Contact  information: [place] Follow up.   Why:  as needed in pregnancy emergencies Contact information: [address] [id number] [place] [phone]           Medication List    TAKE these medications   Prenatal Vitamin 27-0.8 MG Tabs Take 1 tablet by mouth daily.   promethazine 25 MG tablet Commonly known as:  PHENERGAN Take 1 tablet (25 mg total) by mouth every 6 (six) hours as needed for nausea or vomiting.   traMADol 50 MG tablet Commonly known as:  ULTRAM Take 1-2 tablets (50-100 mg total) by mouth every 6 (six) hours as needed for severe pain.       Manya Silvas, [place] 07/15/2016, 3:33 AM  2/3

## 2016-07-14 NOTE — Discharge Instructions (Signed)

## 2016-07-14 NOTE — MAU Note (Signed)
Having some pain and some bleeding.  Was at [facility] yesterday, was given medicine for the pain. They confirmed it was miscarriage. Bleeding and pain has increased.

## 2016-07-14 NOTE — Telephone Encounter (Signed)
Received note from the front office in regards to pt calling and stated that Dr. Ilda Basset was suppose to schedule her a D&C and have not heard anything back.  Contacted pt and pt informed me that she went to [facility] ER because she started bleeding.  Pt stated that they did an [place] and gave her pain medication.  I asked pt if she was having any sx today.  Pt stated that she is still having some bleeding and she is having pain but not as bad as before.  Looking in pt's chart pt is not our patient but a patient of Family Medicine.  Pt stated that she told by the provider at [facility] that she would receive an appt with [facility].  Consulted with [employer], and advisement is to have pt go to MAU for evaluation and if a D&C is needed they would be able to provide.  I informed pt of advisement and to be NPO from the start of this conversation.  Pt stated understanding with no further questions.

## 2016-07-18 ENCOUNTER — Ambulatory Visit: Payer: Self-pay | Admitting: Clinical

## 2016-07-18 ENCOUNTER — Ambulatory Visit (INDEPENDENT_AMBULATORY_CARE_PROVIDER_SITE_OTHER): Payer: Self-pay | Admitting: Family Medicine

## 2016-07-18 ENCOUNTER — Encounter: Payer: Self-pay | Admitting: Family Medicine

## 2016-07-18 VITALS — BP 112/82 | HR 91 | Ht 63.0 in | Wt 165.5 lb

## 2016-07-18 DIAGNOSIS — O039 Complete or unspecified spontaneous abortion without complication: Secondary | ICD-10-CM

## 2016-07-18 DIAGNOSIS — D252 Subserosal leiomyoma of uterus: Secondary | ICD-10-CM

## 2016-07-18 DIAGNOSIS — D251 Intramural leiomyoma of uterus: Secondary | ICD-10-CM

## 2016-07-18 DIAGNOSIS — F419 Anxiety disorder, unspecified: Secondary | ICD-10-CM

## 2016-07-18 DIAGNOSIS — D259 Leiomyoma of uterus, unspecified: Secondary | ICD-10-CM | POA: Insufficient documentation

## 2016-07-18 NOTE — Progress Notes (Signed)
   Subjective:    Patient ID: Lisa Liu is a 20 y.o. female presenting with Miscarriage  on 07/18/2016  HPI: Patient is here for follow up of Diagnosis of missed AB diagnosed at [redacted] weeks gestation on ultrasound followed by spontaneous loss. Ultrasound was repeated showing no IUP, 3.1 cm endometrium with no definite products of conception. Given Cytotec in MAU. Still with minor cramping and bleeding.  Also, concerned about fibroids, as reason for miscarriage. Previously with fibroid as big as 10 cm, but looked smaller on more recent u/s. Reports heavy cycles.  Review of Systems  Constitutional: Negative for chills and fever.  Respiratory: Negative for shortness of breath.   Cardiovascular: Negative for chest pain.  Gastrointestinal: Negative for abdominal pain, nausea and vomiting.  Genitourinary: Positive for pelvic pain (cramping) and vaginal bleeding. Negative for dysuria.  Skin: Negative for rash.      Objective:    BP 112/82   Pulse 91   Ht 5\' 3"  (1.6 m)   Wt 165 lb 8 oz (75.1 kg)   LMP 04/13/2016 (Exact Date)   Breastfeeding? Unknown   BMI 29.32 kg/m  Physical Exam  Constitutional: She is oriented to person, place, and time. She appears well-developed and well-nourished. No distress.  HENT:  Head: Normocephalic and atraumatic.  Eyes: No scleral icterus.  Neck: Neck supple.  Cardiovascular: Normal rate.   Pulmonary/Chest: Effort normal.  Abdominal: Soft. She exhibits mass.  Genitourinary:  Genitourinary Comments: Large fibroids, 16 wk size and bulky  Neurological: She is alert and oriented to person, place, and time.  Skin: Skin is warm and dry.  Psychiatric: She has a normal mood and affect.        Assessment & Plan:   Problem List Items Addressed This Visit      Unprioritized   Fibroid uterus    Has heavy cycles--trying to get insurance--consideration of treatment given size and symptoms       Other Visit Diagnoses    Miscarriage    -   Primary   Likely complete-may attempt pregnancy as soon as desires.      Total face-to-face time with patient: 15 minutes. Over 50% of encounter was spent on counseling and coordination of care. Return in about 4 weeks (around 08/15/2016), or if symptoms worsen or fail to improve.  Donnamae Jude 07/18/2016 8:55 AM

## 2016-07-18 NOTE — Patient Instructions (Addendum)
Miscarriage A miscarriage is the loss of an unborn baby (fetus) before the 20th week of pregnancy. The cause is often unknown. Follow these instructions at home:  You may need to stay in bed (bed rest), or you may be able to do light activity. Go about activity as told by your doctor.  Have help at home.  Write down how many pads you use each day. Write down how soaked they are.  Do not use tampons. Do not wash out your vagina (douche) or have sex (intercourse) until your doctor approves.  Only take medicine as told by your doctor.  Do not take aspirin.  Keep all doctor visits as told.  If you or your partner have problems with grieving, talk to your doctor. You can also try counseling. Give yourself time to grieve before trying to get pregnant again. Get help right away if:  You have bad cramps or pain in your back or belly (abdomen).  You have a fever.  You pass large clumps of blood (clots) from your vagina that are walnut-sized or larger. Save the clumps for your doctor to see.  You pass large amounts of tissue from your vagina. Save the tissue for your doctor to see.  You have more bleeding.  You have thick, bad-smelling fluid (discharge) coming from the vagina.  You get lightheaded, weak, or you pass out (faint).  You have chills. This information is not intended to replace advice given to you by your health care provider. Make sure you discuss any questions you have with your health care provider. Document Released: 10/13/2011 Document Revised: 12/27/2015 Document Reviewed: 08/21/2011 Elsevier Interactive Patient Education  2017 Elsevier Inc.  Uterine Fibroids Uterine fibroids are tissue masses (tumors). They are also called leiomyomas. They can develop inside of a woman's womb (uterus). They can grow very large. Fibroids are not cancerous (benign). Most fibroids do not require medical treatment. Follow these instructions at home:  Keep all follow-up visits as told  by your doctor. This is important.  Take medicines only as told by your doctor.  If you were prescribed a hormone treatment, take the hormone medicines exactly as told.  Do not take aspirin. It can cause bleeding.  Ask your doctor about taking iron pills and increasing the amount of dark green, leafy vegetables in your diet. These actions can help to boost your blood iron levels.  Pay close attention to your period. Tell your doctor about any changes, such as:  Increased blood flow. This may require you to use more pads or tampons than usual per month.  A change in the number of days that your period lasts per month.  A change in symptoms that come with your period, such as back pain or cramping in your belly area (abdomen). Contact a doctor if:  You have pain in your back or the area between your hip bones (pelvic area) that is not controlled by medicines.  You have pain in your abdomen that is not controlled with medicines.  You have an increase in bleeding between and during periods.  You soak tampons or pads in a half hour or less.  You feel lightheaded.  You feel extra tired.  You feel weak. Get help right away if:  You pass out (faint).  You have a sudden increase in pelvic pain. This information is not intended to replace advice given to you by your health care provider. Make sure you discuss any questions you have with your health care provider.  Document Released: 08/23/2010 Document Revised: 03/21/2016 Document Reviewed: 01/17/2014 Elsevier Interactive Patient Education  2017 [employer].

## 2016-07-18 NOTE — BH Specialist Note (Signed)
Session Start time: 9:23   End Time: 9:33 Total Time:  10 minutes Type of Service: [place] Interpreter: No.   Interpreter Name & Language: n/a # [facility] Visits July 2017-June 2018: 1st  SUBJECTIVE: Lisa Liu is a 20 y.o. female  Pt. was referred by Dr. Kennon Rounds for:  anxiety. Pt. reports the following symptoms/concerns: Pt states her symptoms of anxiety in past week are attributed to recent miscarriage, and that symptoms have resolved; she copes with life stressors by talking to aunts.  Duration of problem:  2 days, resolved Severity: n/a Previous treatment: none  OBJECTIVE: Mood: Appropriate & Affect: Appropriate Risk of harm to self or others: No known risk of harm to self or others Assessments administered: PHQ9: 6/ GAD7: 10  LIFE CONTEXT:  Life changes: miscarriage 4 days prior  GOALS ADDRESSED:  -Maintain reduction of symptoms of anxiety  INTERVENTIONS: Strength-based and Supportive   ASSESSMENT:  Pt currently experiencing Anxious mood.  Pt may benefit from psychoeducation and brief therapeutic intervention regarding coping with symptoms of anxiety  PLAN: 1. F/U with behavioral health clinician: As needed 2. Behavioral Health meds: none 3. Behavioral recommendation: -Continue to call aunts if symptoms of anxiety increase -Consider reading educational material regarding coping with symptoms of anxiety 4. Referral: Brief Counseling/Psychotherapy and Psychoeducation  [place] Clinician  Geraldine Contras:   Warm Hand Off Completed.        Depression screen [facility] 2/9 07/18/2016 07/10/2016  Decreased Interest - 0  Down, Depressed, Hopeless 1 0  PHQ - 2 Score 1 0  Altered sleeping 1 -  Tired, decreased energy 2 -  Change in appetite 2 -  Feeling bad or failure about yourself  0 -  Trouble concentrating 0 -  Moving slowly or fidgety/restless 0 -  Suicidal thoughts 0 -  PHQ-9 Score 6 -   GAD 7 :  Generalized Anxiety Score 07/18/2016  Nervous, Anxious, on Edge 1  Control/stop worrying 2  Worry too much - different things 2  Trouble relaxing 1  Restless 1  Easily annoyed or irritable 1  Afraid - awful might happen 2  Total GAD 7 Score 10

## 2016-07-18 NOTE — Assessment & Plan Note (Addendum)
Has heavy cycles--trying to get insurance--consideration of treatment given size and symptoms

## 2016-07-24 ENCOUNTER — Encounter: Payer: Self-pay | Admitting: Obstetrics and Gynecology

## 2016-08-04 NOTE — L&D Delivery Note (Signed)
Delivery Note At 1:03 PM a viable female was delivered via Vaginal, Spontaneous Delivery (Presentation: ROA).  Over a 2nd degree perineal laceration.  APGAR: 9, 9; Delivered with no complications. Placenta status: intact, 3VC, delayed cord clamping performed.  Anesthesia:  Epidural, local Episiotomy: None Lacerations:  2nd degree perineal Suture Repair: 3.0 monocryl Est. Blood Loss (mL):  250  Fundus firm.  Mom to postpartum.  Baby to Couplet care / Skin to Skin.  Lisa Liu, SNM 05/12/2017, 1:35 PM  Patient is a G1 at [redacted]w[redacted]d who was admitted in latent labor/for IOL due to gHTN noted on admission, significant hx of vomiting consistently during preg with elevated LFTs one month ago.  She progressed with augmentation via Pitocin.  I was gloved and present for delivery in its entirety.  Second stage of labor progressed, baby delivered after 2 contractions.  No decels during second stage noted.  Complications: none  Lacerations: 2nd degree perineal  EBL: 250cc  Lisa Liu, CNM 1:59 PM 05/12/2017

## 2016-08-18 ENCOUNTER — Encounter: Payer: Self-pay | Admitting: Family Medicine

## 2016-08-18 ENCOUNTER — Ambulatory Visit (INDEPENDENT_AMBULATORY_CARE_PROVIDER_SITE_OTHER): Payer: Self-pay | Admitting: Family Medicine

## 2016-08-18 VITALS — BP 115/80 | HR 85 | Ht 65.0 in | Wt 164.0 lb

## 2016-08-18 DIAGNOSIS — D251 Intramural leiomyoma of uterus: Secondary | ICD-10-CM

## 2016-08-18 DIAGNOSIS — Z3169 Encounter for other general counseling and advice on procreation: Secondary | ICD-10-CM

## 2016-08-18 DIAGNOSIS — D252 Subserosal leiomyoma of uterus: Secondary | ICD-10-CM

## 2016-08-18 MED ORDER — PRENATAL VITAMIN 27-0.8 MG PO TABS: 1.0000 | ORAL_TABLET | Freq: Every day | ORAL | 12 refills | Status: DC

## 2016-08-18 NOTE — Addendum Note (Signed)
Addended by: Shelly Coss on: 08/18/2016 03:20 PM   Modules accepted: Orders

## 2016-08-18 NOTE — Assessment & Plan Note (Signed)
Re-eval in 4-5 months by u/s if not pregnant by then.

## 2016-08-18 NOTE — Progress Notes (Addendum)
   Subjective:    Patient ID: Lisa Liu is a 21 y.o. female presenting with Follow-up  on 08/18/2016  HPI: Here for f/u of SAB and fibroid.  Thinks she is starting her period. Denies heavy cycles. Pain is resolved. Has some dymenorrhea which is relieved with tylenol.  Review of Systems  Constitutional: Negative for chills and fever.  Respiratory: Negative for shortness of breath.   Cardiovascular: Negative for chest pain.  Gastrointestinal: Negative for abdominal pain, nausea and vomiting.  Genitourinary: Negative for dysuria.  Skin: Negative for rash.      Objective:    BP 115/80   Pulse 85   Ht 5\' 5"  (1.651 m)   Wt 164 lb (74.4 kg)   LMP 08/17/2016   BMI 27.29 kg/m  Physical Exam  Constitutional: She is oriented to person, place, and time. She appears well-developed and well-nourished. No distress.  HENT:  Head: Normocephalic and atraumatic.  Eyes: No scleral icterus.  Neck: Neck supple.  Cardiovascular: Normal rate.   Pulmonary/Chest: Effort normal.  Abdominal: Soft.  Neurological: She is alert and oriented to person, place, and time.  Skin: Skin is warm and dry.  Psychiatric: She has a normal mood and affect.        Assessment & Plan:   Problem List Items Addressed This Visit      Unprioritized   Fibroid uterus    Re-eval in 4-5 months by u/s if not pregnant by then.       Other Visit Diagnoses    Encounter for preconception consultation    -  Primary   advised to stay on PNV's, no tob, Etoh or drug while trying to conceive   Relevant Medications   Prenatal Vit-Fe Fumarate-FA (PRENATAL VITAMIN) 27-0.8 MG TABS      Total face-to-face time with patient: 10 minutes. Over 50% of encounter was spent on counseling and coordination of care. Return in about 3 months (around 11/16/2016).  Donnamae Jude 08/18/2016 1:42 PM

## 2016-08-18 NOTE — Patient Instructions (Addendum)
Uterine Fibroids Uterine fibroids are tissue masses (tumors). They are also called leiomyomas. They can develop inside of a woman's womb (uterus). They can grow very large. Fibroids are not cancerous (benign). Most fibroids do not require medical treatment. Follow these instructions at home:  Keep all follow-up visits as told by your doctor. This is important.  Take medicines only as told by your doctor.  If you were prescribed a hormone treatment, take the hormone medicines exactly as told.  Do not take aspirin. It can cause bleeding.  Ask your doctor about taking iron pills and increasing the amount of dark green, leafy vegetables in your diet. These actions can help to boost your blood iron levels.  Pay close attention to your period. Tell your doctor about any changes, such as:  Increased blood flow. This may require you to use more pads or tampons than usual per month.  A change in the number of days that your period lasts per month.  A change in symptoms that come with your period, such as back pain or cramping in your belly area (abdomen). Contact a doctor if:  You have pain in your back or the area between your hip bones (pelvic area) that is not controlled by medicines.  You have pain in your abdomen that is not controlled with medicines.  You have an increase in bleeding between and during periods.  You soak tampons or pads in a half hour or less.  You feel lightheaded.  You feel extra tired.  You feel weak. Get help right away if:  You pass out (faint).  You have a sudden increase in pelvic pain. This information is not intended to replace advice given to you by your health care provider. Make sure you discuss any questions you have with your health care provider. Document Released: 08/23/2010 Document Revised: 03/21/2016 Document Reviewed: 01/17/2014 Elsevier Interactive Patient Education  2017 [employer].  Preparing for Pregnancy If you are considering  becoming pregnant, make an appointment to see your regular health care provider to learn how to prepare for a safe and healthy pregnancy (preconception care). During a preconception care visit, your health care provider will:  Do a complete physical exam, including a Pap test.  Take a complete medical history.  Give you information, answer your questions, and help you resolve problems. Preconception checklist Medical history  Tell your health care provider about any current or past medical conditions. Your pregnancy or your ability to become pregnant may be affected by chronic conditions, such as diabetes, chronic hypertension, and thyroid problems.  Include your family's medical history as well as your partner's medical history.  Tell your health care provider about any history of STIs (sexually transmitted infections).These can affect your pregnancy. In some cases, they can be passed to your baby. Discuss any concerns that you have about STIs.  If indicated, discuss the benefits of genetic testing. This testing will show whether there are any genetic conditions that may be passed from you or your partner to your baby.  Tell your health care provider about:  Any problems you have had with conception or pregnancy.  Any medicines you take. These include vitamins, herbal supplements, and over-the-counter medicines.  Your history of immunizations. Discuss any vaccinations that you may need. Diet  Ask your health care provider what to include in a healthy diet that has a balance of nutrients. This is especially important when you are pregnant or preparing to become pregnant.  Ask your health care provider  to help you reach a healthy weight before pregnancy.  If you are overweight, you may be at higher risk for certain complications, such as high blood pressure, diabetes, and preterm birth.  If you are underweight, you are more likely to have a baby who has a low birth  weight. Lifestyle, work, and home  Let your health care provider know:  About any lifestyle habits that you have, such as alcohol use, drug use, or smoking.  About recreational activities that may put you at risk during pregnancy, such as downhill skiing and certain exercise programs.  Tell your health care provider about any international travel, especially any travel to places with an active [place] virus outbreak.  About harmful substances that you may be exposed to at work or at home. These include chemicals, pesticides, radiation, or even litter boxes.  If you do not feel safe at home. Mental health  Tell your health care provider about:  Any history of mental health conditions, including feelings of depression, sadness, or anxiety.  Any medicines that you take for a mental health condition. These include herbs and supplements. Home instructions to prepare for pregnancy  Lifestyle  Eat a balanced diet. This includes fresh fruits and vegetables, whole grains, lean meats, low-fat dairy products, healthy fats, and foods that are high in fiber. Ask to meet with a nutritionist or registered dietitian for assistance with meal planning and goals.  Get regular exercise. Try to be active for at least 30 minutes a day on most days of the week. Ask your health care provider which activities are safe during pregnancy.  Do not use any products that contain nicotine or tobacco, such as cigarettes and e-cigarettes. If you need help quitting, ask your health care provider.  Do not drink alcohol.  Do not take illegal drugs.  Maintain a healthy weight. Ask your health care provider what weight range is right for you. General instructions  Keep an accurate record of your menstrual periods. This makes it easier for your health care provider to determine your baby's due date.  Begin taking prenatal vitamins and folic acid supplements daily as directed by your health care provider.  Manage any  chronic conditions, such as high blood pressure and diabetes, as told by your health care provider. This is important. How do I know that I am pregnant? You may be pregnant if you have been sexually active and you miss your period. Symptoms of early pregnancy include:  Mild cramping.  Very light vaginal bleeding (spotting).  Feeling unusually tired.  Nausea and vomiting (morning sickness). If you have any of these symptoms and you suspect that you might be pregnant, you can take a home pregnancy test. These tests check for a hormone in your urine (human chorionic gonadotropin, or hCG). A woman's body begins to make this hormone during early pregnancy. These tests are very accurate. Wait until at least the first day after you miss your period to take one. If the test shows that you are pregnant (you get a positive result), call your health care provider to make an appointment for prenatal care. What should I do if I become pregnant?  Make an appointment with your health care provider as soon as you suspect you are pregnant.  Do not use any products that contain nicotine, such as cigarettes, chewing tobacco, and e-cigarettes. If you need help quitting, ask your health care provider.  Do not drink alcoholic beverages. Alcohol is related to a number of birth defects.  Avoid toxic odors and chemicals.  You may continue to have sexual intercourse if it does not cause pain or other problems, such as vaginal bleeding. This information is not intended to replace advice given to you by your health care provider. Make sure you discuss any questions you have with your health care provider. Document Released: 07/03/2008 Document Revised: 03/18/2016 Document Reviewed: 02/10/2016 Elsevier Interactive Patient Education  2017 [employer].

## 2016-09-09 ENCOUNTER — Ambulatory Visit (HOSPITAL_COMMUNITY)
Admission: EM | Admit: 2016-09-09 | Discharge: 2016-09-09 | Disposition: A | Discharge status: Home / Self Care | Payer: Self-pay | Attending: Family Medicine | Admitting: Family Medicine

## 2016-09-09 DIAGNOSIS — R112 Nausea with vomiting, unspecified: Secondary | ICD-10-CM

## 2016-09-09 LAB — POCT URINALYSIS DIP (DEVICE)
Bilirubin Urine: NEGATIVE
GLUCOSE, UA: NEGATIVE mg/dL
Hgb urine dipstick: NEGATIVE
Ketones, ur: NEGATIVE mg/dL
LEUKOCYTES UA: NEGATIVE
Nitrite: NEGATIVE
PROTEIN: NEGATIVE mg/dL
Specific Gravity, Urine: 1.02 (ref 1.005–1.030)
UROBILINOGEN UA: 0.2 mg/dL (ref 0.0–1.0)
pH: 7 (ref 5.0–8.0)

## 2016-09-09 LAB — POCT PREGNANCY, URINE: PREG TEST UR: NEGATIVE

## 2016-09-09 MED ORDER — ONDANSETRON 4 MG PO TBDP: 4.0000 mg | ORAL_TABLET | Freq: Three times a day (TID) | ORAL | 0 refills | Status: DC | PRN

## 2016-09-09 NOTE — Discharge Instructions (Signed)
Your pregnancy test was negative and your urine test does not show any signs or symptoms of infection. I have prescribed a medicine for nausea and vomiting called Zofran, take one tablet under the tongue every 8 hours as needed. Should your symptoms fail to resolve follow up with your primary care provider or return to clinic as needed.

## 2016-09-09 NOTE — ED Provider Notes (Signed)
CSN: [id number]     Arrival date & time 09/09/16  1546 History   None    Chief Complaint  Patient presents with  . Constipation   (Consider location/radiation/quality/duration/timing/severity/associated sxs/prior Treatment) 21 year old female presents to clinic with chief complaint of nausea and vomiting for last 4 days. She reports vomiting 4-5 times a day. She has had fluids but not eaten much over previous several days. Also states she has had some diarrhea and constipation however she had a normal bowel movement yesterday. She denies any abdominal pain, flank pain, or fever. She reports having had a miscarrage in December, she is under the care of an OB/GYN and has had a visit since her miscarriage. LMP 08/20/16. She reports some spotting today and requests pregnancy test.    The history is provided by the patient.  Constipation    Past Medical History:  Diagnosis Date  . Fibroids    No past surgical history on file. No family history on file. Social History  Substance Use Topics  . Smoking status: Never Smoker  . Smokeless tobacco: Current User  . Alcohol use No   OB History    Gravida Para Term Preterm AB Living   1             SAB TAB Ectopic Multiple Live Births                 Review of Systems  Reason unable to perform ROS: as covered in HPI.  Gastrointestinal: Positive for constipation.  All other systems reviewed and are negative.   Allergies  Patient has no known allergies.  Home Medications   Prior to Admission medications   Medication Sig Start Date End Date Taking? Authorizing Provider  ondansetron (ZOFRAN ODT) 4 MG disintegrating tablet Take 1 tablet (4 mg total) by mouth every 8 (eight) hours as needed for nausea or vomiting. 09/09/16   Barnet Glasgow, NP  Prenatal Vit-Fe Fumarate-FA (PRENATAL VITAMIN) 27-0.8 MG TABS Take 1 tablet by mouth daily. 08/18/16   Donnamae Jude, MD   Meds Ordered and Administered this Visit  Medications - No data to  display  BP 118/70 (BP Location: Right Arm)   Pulse 90   Temp 98.4 F (36.9 C) (Oral)   Resp 16   LMP 08/17/2016   SpO2 100%  No data found.   Physical Exam  Constitutional: She is oriented to person, place, and time. She appears well-developed and well-nourished. No distress.  HENT:  Head: Normocephalic.  Right Ear: External ear normal.  Left Ear: External ear normal.  Cardiovascular: Normal rate and regular rhythm.   Pulmonary/Chest: Effort normal and breath sounds normal.  Abdominal: Soft. Bowel sounds are normal. There is no hepatosplenomegaly. There is no tenderness. There is no rigidity, no rebound, no guarding, no CVA tenderness, no tenderness at McBurney's point and negative Murphy's sign.  Neurological: She is alert and oriented to person, place, and time.  Skin: Skin is warm and dry. Capillary refill takes less than 2 seconds. She is not diaphoretic.  Psychiatric: She has a normal mood and affect.  Nursing note and vitals reviewed.   Urgent Care Course     Procedures (including critical care time)  Labs Review Labs Reviewed  POCT URINALYSIS DIP (DEVICE)  POCT PREGNANCY, URINE    Imaging Review No results found.   Visual Acuity Review  Right Eye Distance:   Left Eye Distance:   Bilateral Distance:    Right Eye Near:  Left Eye Near:    Bilateral Near:         MDM   1. Non-intractable vomiting with nausea, unspecified vomiting type   Your pregnancy test was negative and your urine test does not show any signs or symptoms of infection. I have prescribed a medicine for nausea and vomiting called Zofran, take one tablet under the tongue every 8 hours as needed. Should your symptoms fail to resolve follow up with your primary care provider or return to clinic as needed.    Barnet Glasgow, NP 09/09/16 [phone]

## 2016-09-09 NOTE — ED Triage Notes (Signed)
C/o vomiting and constipation for four days No meds taking as tx

## 2016-09-23 ENCOUNTER — Ambulatory Visit (HOSPITAL_COMMUNITY)
Admission: EM | Admit: 2016-09-23 | Discharge: 2016-09-23 | Disposition: A | Discharge status: Home / Self Care | Payer: Self-pay | Attending: Family Medicine | Admitting: Family Medicine

## 2016-09-23 ENCOUNTER — Encounter (HOSPITAL_COMMUNITY): Payer: Self-pay | Admitting: Family Medicine

## 2016-09-23 DIAGNOSIS — R531 Weakness: Secondary | ICD-10-CM

## 2016-09-23 DIAGNOSIS — Z3201 Encounter for pregnancy test, result positive: Secondary | ICD-10-CM

## 2016-09-23 DIAGNOSIS — R112 Nausea with vomiting, unspecified: Secondary | ICD-10-CM

## 2016-09-23 DIAGNOSIS — O26811 Pregnancy related exhaustion and fatigue, first trimester: Secondary | ICD-10-CM

## 2016-09-23 LAB — POCT PREGNANCY, URINE: Preg Test, Ur: POSITIVE — AB

## 2016-09-23 MED ORDER — DOXYLAMINE-PYRIDOXINE 10-10 MG PO TBEC: DELAYED_RELEASE_TABLET | ORAL | 0 refills | Status: DC

## 2016-09-23 NOTE — Discharge Instructions (Signed)
Your pregnancy test is positive. His likely that this is causing most of your symptoms. Be sure to drink clear fluids and stay well-hydrated. There is a prescription written for you to help with nausea and vomiting. Call your OB/GYN doctor for an appointment. For any problems or worsening such is vaginal bleeding or pain see her doctor sooner. You have a palpable mass in your lower abdomen. I do not know if this is your fibroid or something else. You need to let your doctor know about this if he does not already.

## 2016-09-23 NOTE — ED Provider Notes (Signed)
CSN: [id number]     Arrival date & time 09/23/16  1454 History   None    Chief Complaint  Patient presents with  . Nausea  . Fatigue   (Consider location/radiation/quality/duration/timing/severity/associated sxs/prior Treatment) 21 year old female presents to the urgent care with complaints including feeling tired, vomiting intermittently for 2 weeks on a near daily basis. She has it with eating. At times when she is not vomiting she has some level of nausea. She also states her LMP was 08/18/2016 and states she is about 5 days late.      Past Medical History:  Diagnosis Date  . Fibroids    History reviewed. No pertinent surgical history. History reviewed. No pertinent family history. Social History  Substance Use Topics  . Smoking status: Never Smoker  . Smokeless tobacco: Current User  . Alcohol use No   OB History    Gravida Para Term Preterm AB Living   1             SAB TAB Ectopic Multiple Live Births                 Review of Systems  Constitutional: Positive for activity change and fatigue. Negative for fever.  HENT: Negative.   Respiratory: Negative.   Cardiovascular: Negative.   Gastrointestinal: Positive for nausea and vomiting.  Genitourinary: Negative.   Neurological: Negative.     Allergies  Patient has no known allergies.  Home Medications   Prior to Admission medications   Medication Sig Start Date End Date Taking? Authorizing Provider  Doxylamine-Pyridoxine 10-10 MG TBEC Take 2 first dose then 1 bid prn N or V. 09/23/16   Janne Napoleon, NP  Prenatal Vit-Fe Fumarate-FA (PRENATAL VITAMIN) 27-0.8 MG TABS Take 1 tablet by mouth daily. 08/18/16   Donnamae Jude, MD   Meds Ordered and Administered this Visit  Medications - No data to display  BP 107/72   Pulse 91   Temp 98.6 F (37 C)   Resp 18   LMP 08/18/2016   SpO2 100%  No data found.   Physical Exam  Constitutional: She is oriented to person, place, and time. She appears well-developed and  well-nourished. No distress.  HENT:  Head: Normocephalic and atraumatic.  Eyes: EOM are normal. No scleral icterus.  Neck: Normal range of motion.  Cardiovascular: Normal rate, regular rhythm, normal heart sounds and intact distal pulses.   Pulmonary/Chest: Effort normal and breath sounds normal. No respiratory distress. She has no wheezes. She has no rales.  Abdominal: Soft. Bowel sounds are normal. She exhibits mass. She exhibits no distension. There is no tenderness. There is no rebound and no guarding.  There is a 6 cm by approximately 7-8 cm firm mass in the abdomen just inferior and to the right of the umbilicus. It is slightly mobile. Nontender.  Musculoskeletal: She exhibits no edema.  Neurological: She is alert and oriented to person, place, and time. No cranial nerve deficit.  Skin: Skin is warm and dry.  Psychiatric: She has a normal mood and affect.  Nursing note and vitals reviewed.   Urgent Care Course     Procedures (including critical care time)  Labs Review Labs Reviewed  POCT PREGNANCY, URINE - Abnormal; Notable for the following:       Result Value   Preg Test, Ur POSITIVE (*)    All other components within normal limits    Imaging Review No results found.   Visual Acuity Review  Right Eye Distance:  Left Eye Distance:   Bilateral Distance:    Right Eye Near:   Left Eye Near:    Bilateral Near:         MDM   1. Weakness   2. Pregnancy related fatigue in first trimester   3. Positive urine pregnancy test   4. Non-intractable vomiting with nausea, unspecified vomiting type    Your pregnancy test is positive. His likely that this is causing most of your symptoms. Be sure to drink clear fluids and stay well-hydrated. There is a prescription written for you to help with nausea and vomiting. Call your OB/GYN doctor for an appointment. For any problems or worsening such is vaginal bleeding or pain see her doctor sooner. You have a palpable mass in  your lower abdomen. I do not know if this is your fibroid or something else. You need to let your doctor know about this if he does not already. Meds ordered this encounter  Medications  . Doxylamine-Pyridoxine 10-10 MG TBEC    Sig: Take 2 first dose then 1 bid prn N or V.    Dispense:  60 tablet    Refill:  0    Order Specific Question:   Supervising Provider    Answer:   Billy Fischer [[id number]]      Janne Napoleon, NP 09/23/16 1725

## 2016-09-23 NOTE — ED Triage Notes (Signed)
Pt here for Nausea,fatigue and headaches x 2 weeks.

## 2016-10-15 ENCOUNTER — Inpatient Hospital Stay (HOSPITAL_COMMUNITY)
Admission: AD | Admit: 2016-10-15 | Discharge: 2016-10-15 | Disposition: A | Discharge status: Home / Self Care | Payer: Self-pay | Source: Ambulatory Visit | Attending: Obstetrics & Gynecology | Admitting: Obstetrics & Gynecology

## 2016-10-15 ENCOUNTER — Inpatient Hospital Stay (HOSPITAL_COMMUNITY): Payer: Self-pay

## 2016-10-15 ENCOUNTER — Encounter (HOSPITAL_COMMUNITY): Payer: Self-pay | Admitting: *Deleted

## 2016-10-15 DIAGNOSIS — Z3169 Encounter for other general counseling and advice on procreation: Secondary | ICD-10-CM

## 2016-10-15 DIAGNOSIS — D259 Leiomyoma of uterus, unspecified: Secondary | ICD-10-CM | POA: Insufficient documentation

## 2016-10-15 DIAGNOSIS — O219 Vomiting of pregnancy, unspecified: Secondary | ICD-10-CM | POA: Diagnosis present

## 2016-10-15 DIAGNOSIS — Z3A01 Less than 8 weeks gestation of pregnancy: Secondary | ICD-10-CM | POA: Insufficient documentation

## 2016-10-15 DIAGNOSIS — O3411 Maternal care for benign tumor of corpus uteri, first trimester: Secondary | ICD-10-CM | POA: Insufficient documentation

## 2016-10-15 DIAGNOSIS — O3410 Maternal care for benign tumor of corpus uteri, unspecified trimester: Secondary | ICD-10-CM

## 2016-10-15 DIAGNOSIS — O210 Mild hyperemesis gravidarum: Secondary | ICD-10-CM | POA: Insufficient documentation

## 2016-10-15 LAB — URINALYSIS, ROUTINE W REFLEX MICROSCOPIC
Bilirubin Urine: NEGATIVE
GLUCOSE, UA: NEGATIVE mg/dL
Hgb urine dipstick: NEGATIVE
Ketones, ur: NEGATIVE mg/dL
LEUKOCYTES UA: NEGATIVE
Nitrite: NEGATIVE
PH: 6 (ref 5.0–8.0)
PROTEIN: NEGATIVE mg/dL
Specific Gravity, Urine: <1.005 — ABNORMAL LOW (ref 1.005–1.030)

## 2016-10-15 MED ORDER — PROMETHAZINE HCL 25 MG PO TABS: 25.0000 mg | ORAL_TABLET | Freq: Four times a day (QID) | ORAL | 2 refills | Status: DC | PRN

## 2016-10-15 MED ORDER — PRENATAL VITAMIN 27-0.8 MG PO TABS: 1.0000 | ORAL_TABLET | Freq: Every day | ORAL | 12 refills | Status: DC

## 2016-10-15 NOTE — Discharge Instructions (Signed)
Morning Sickness Morning sickness is when you feel sick to your stomach (nauseous) during pregnancy. This nauseous feeling may or may not come with vomiting. It often occurs in the morning but can be a problem any time of day. Morning sickness is most common during the first trimester, but it may continue throughout pregnancy. While morning sickness is unpleasant, it is usually harmless unless you develop severe and continual vomiting (hyperemesis gravidarum). This condition requires more intense treatment. What are the causes? The cause of morning sickness is not completely known but seems to be related to normal hormonal changes that occur in pregnancy. What increases the risk? You are at greater risk if you:  Experienced nausea or vomiting before your pregnancy.  Had morning sickness during a previous pregnancy.  Are pregnant with more than one baby, such as twins. How is this treated? Do not use any medicines (prescription, over-the-counter, or herbal) for morning sickness without first talking to your health care provider. Your health care provider may prescribe or recommend:  Vitamin B6 supplements.  Anti-nausea medicines.  The herbal medicine ginger. Follow these instructions at home:  Only take over-the-counter or prescription medicines as directed by your health care provider.  Taking multivitamins before getting pregnant can prevent or decrease the severity of morning sickness in most women.  Eat a piece of dry toast or unsalted crackers before getting out of bed in the morning.  Eat five or six small meals a day.  Eat dry and bland foods (rice, baked potato). Foods high in carbohydrates are often helpful.  Do not drink liquids with your meals. Drink liquids between meals.  Avoid greasy, fatty, and spicy foods.  Get someone to cook for you if the smell of any food causes nausea and vomiting.  If you feel nauseous after taking prenatal vitamins, take the vitamins at  night or with a snack.  Snack on protein foods (nuts, yogurt, cheese) between meals if you are hungry.  Eat unsweetened gelatins for desserts.  Wearing an acupressure wristband (worn for sea sickness) may be helpful.  Acupuncture may be helpful.  Do not smoke.  Get a humidifier to keep the air in your house free of odors.  Get plenty of fresh air. Contact a health care provider if:  Your home remedies are not working, and you need medicine.  You feel dizzy or lightheaded.  You are losing weight. Get help right away if:  You have persistent and uncontrolled nausea and vomiting.  You pass out (faint). This information is not intended to replace advice given to you by your health care provider. Make sure you discuss any questions you have with your health care provider. Document Released: 09/11/2006 Document Revised: 12/27/2015 Document Reviewed: 01/05/2013 Elsevier Interactive Patient Education  2017 Elsevier Inc.    Uterine Fibroids Uterine fibroids are tissue masses (tumors) that can develop in the womb (uterus). They are also called leiomyomas. This type of tumor is not cancerous (benign) and does not spread to other parts of the body outside of the pelvic area, which is between the hip bones. Occasionally, fibroids may develop in the fallopian tubes, in the cervix, or on the support structures (ligaments) that surround the uterus. You can have one or many fibroids. Fibroids can vary in size, weight, and where they grow in the uterus. Some can become quite large. Most fibroids do not require medical treatment. What are the causes? A fibroid can develop when a single uterine cell keeps growing (replicating). Most cells in  the human body have a control mechanism that keeps them from replicating without control. What are the signs or symptoms? Symptoms may include:  Heavy bleeding during your period.  Bleeding or spotting between periods.  Pelvic pain and  pressure.  Bladder problems, such as needing to urinate more often (urinary frequency) or urgently.  Inability to reproduce offspring (infertility).  Miscarriages. How is this diagnosed? Uterine fibroids are diagnosed through a physical exam. Your health care provider may feel the lumpy tumors during a pelvic exam. Ultrasonography and an MRI may be done to determine the size, location, and number of fibroids. How is this treated? Treatment may include:  Watchful waiting. This involves getting the fibroid checked by your health care provider to see if it grows or shrinks. Follow your health care provider's recommendations for how often to have this checked.  Hormone medicines. These can be taken by mouth or given through an intrauterine device (IUD).  Surgery.  Removing the fibroids (myomectomy) or the uterus (hysterectomy).  Removing blood supply to the fibroids (uterine artery embolization). If fibroids interfere with your fertility and you want to become pregnant, your health care provider may recommend having the fibroids removed. Follow these instructions at home:  Keep all follow-up visits as directed by your health care provider. This is important.  Take over-the-counter and prescription medicines only as told by your health care provider.  If you were prescribed a hormone treatment, take the hormone medicines exactly as directed.  Ask your health care provider about taking iron pills and increasing the amount of dark green, leafy vegetables in your diet. These actions can help to boost your blood iron levels, which may be affected by heavy menstrual bleeding.  Pay close attention to your period and tell your health care provider about any changes, such as:  Increased blood flow that requires you to use more pads or tampons than usual per month.  A change in the number of days that your period lasts per month.  A change in symptoms that are associated with your period,  such as abdominal cramping or back pain. Contact a health care provider if:  You have pelvic pain, back pain, or abdominal cramps that cannot be controlled with medicines.  You have an increase in bleeding between and during periods.  You soak tampons or pads in a half hour or less.  You feel lightheaded, extra tired, or weak. Get help right away if:  You faint.  You have a sudden increase in pelvic pain. This information is not intended to replace advice given to you by your health care provider. Make sure you discuss any questions you have with your health care provider. Document Released: 07/18/2000 Document Revised: 03/20/2016 Document Reviewed: 01/17/2014 Elsevier Interactive Patient Education  2017 [employer].

## 2016-10-15 NOTE — MAU Note (Addendum)
Pt C/O lower abd pain since yesterday, also pain with urination.  Denies bleeding.  Has had ongoing vomiting.  Hx of fibroids, also miscarriage in December.

## 2016-10-15 NOTE — MAU Provider Note (Signed)
[facility] Attending Note  History     CSN: [id number]  Arrival date & time 10/15/16  1644   First Provider Initiated Contact with Patient 10/15/16 1946      Chief Complaint  Patient presents with  . Abdominal Pain  . Dysuria    Lisa Liu is a 21 y.o. G2P0 at [redacted]w[redacted]d who presents to MAU today for evaluation of lower abdominal pain and pelvic pain and pressure with urination for the past 3-4 days.  Associated with vomiting. Was seen in ED on 09/23/16, prescribed Diclegis which she did not get due to cost. Was also noted to have fibroids during examination.  Of note, has SAB in 07/2016, she is nervous about this.  Denies any abnormal vaginal discharge, fevers, chills, sweats, other GI or GU symptoms or other general symptoms.   Obstetric History   G2   P0   T0   P0   A0   L0    SAB0   TAB0   Ectopic0   Multiple0   Live Births0     # Outcome Date GA Lbr Len/2nd Weight Sex Delivery Anes PTL Lv  2 Current           1 Gravida               Past Medical History:  Diagnosis Date  . Fibroids     No past surgical history on file.  No family history on file.  Social History  Substance Use Topics  . Smoking status: Never Smoker  . Smokeless tobacco: Current User  . Alcohol use No    No Known Allergies  Prescriptions Prior to Admission  Medication Sig Dispense Refill Last Dose  . [DISCONTINUED] Prenatal Vit-Fe Fumarate-FA (PRENATAL VITAMIN) 27-0.8 MG TABS Take 1 tablet by mouth daily. 30 tablet 12 10/14/2016 at Unknown time  . Doxylamine-Pyridoxine 10-10 MG TBEC Take 2 first dose then 1 bid prn N or V. (Patient not taking: Reported on 10/15/2016) 60 tablet 0 Not Taking at Unknown time     Physical Exam  BP 122/73 (BP Location: Right Arm)   Pulse 94   Temp 97.5 F (36.4 C) (Oral)   Resp 18   Ht 5\' 5"  (1.651 m)   Wt 172 lb (78 kg)   LMP 08/18/2016   BMI 28.62 kg/m  GENERAL: Well-developed, well-nourished female in no acute distress  SKIN:  Warm, dry and without erythema PSYCH: Normal mood and affect HEENT: Normocephalic, atraumatic.   LUNGS: Normal respiratory effort, normal breath sounds HEART: Regular rate noted ABDOMEN: Soft, nondistended, nontender,gravid fibroid uterus palpated. PELVIC: Deferred EXTREMITIES: No edema, no cyanosis, normal range of movement  MAU Course/MDM  UA done and ultrasound ordered  Labs and Imaging   Results for orders placed or performed during the hospital encounter of 10/15/16 (from the past 24 hour(s))  Urinalysis, Routine w reflex microscopic     Status: Abnormal   Collection Time: 10/15/16  5:30 PM  Result Value Ref Range   Color, Urine YELLOW YELLOW   APPearance CLEAR CLEAR   Specific Gravity, Urine <1.005 (L) 1.005 - 1.030   pH 6.0 5.0 - 8.0   Glucose, UA NEGATIVE NEGATIVE mg/dL   Hgb urine dipstick NEGATIVE NEGATIVE   Bilirubin Urine NEGATIVE NEGATIVE   Ketones, ur NEGATIVE NEGATIVE mg/dL   Protein, ur NEGATIVE NEGATIVE mg/dL   Nitrite NEGATIVE NEGATIVE   Leukocytes, UA NEGATIVE NEGATIVE   US Ob Comp Less 14 Wks  Result Date: 10/15/2016  CLINICAL DATA:  Lower abdominal pain for 1 day. EXAM: OBSTETRIC <14 WK ULTRASOUND TECHNIQUE: Transabdominal ultrasound was performed for evaluation of the gestation as well as the maternal uterus and adnexal regions. COMPARISON:  None. FINDINGS: Intrauterine gestational sac: Single Yolk sac:  Visualized Embryo:  Visualized Cardiac Activity: Visualized Heart Rate: 165 bpm MSD:   mm    w     d CRL:   15.5  mm   7 w 6 d                  [place] EDC: 05/28/2017 Subchorionic hemorrhage:  None visualized. Maternal uterus/adnexae: No adnexal mass or free fluid. Numerous fibroids throughout the uterus, the largest arising from the fundus measuring 10 cm. IMPRESSION: Seven week 6 day intrauterine pregnancy. Fetal heart rate 165 beats per minute. Numerous uterine fibroids, the largest in the fundus measuring 10 cm. Electronically Signed   By: Rolm Baptise M.D.   On:  10/15/2016 18:57    Assessment and Plan   1. Nausea and vomiting of pregnancy, antepartum   2. Uterine fibroids affecting pregnancy, antepartum     IUP at [redacted]w[redacted]d Phenergan prescribed for nausea; Tylenol recommended for pain. Pelvic pain likely 2/2 fibroids pressing on bladder, no evidence of UTI.  Patient told that fibroids can cause pain during pregnancy, may need further intervention if pain worsens.  Was told to return to MAU for any pain, bleeding or other concerns, or if her condition were to change or worsen. Discharged to home in stable condition, new OB visit scheduled at [facility] on 11/10/2016 at 0920.    Allergies as of 10/15/2016   No Known Allergies     Medication List    STOP taking these medications   Doxylamine-Pyridoxine 10-10 MG Tbec     TAKE these medications   Prenatal Vitamin 27-0.8 MG Tabs Take 1 tablet by mouth daily.   promethazine 25 MG tablet Commonly known as:  PHENERGAN Take 1 tablet (25 mg total) by mouth every 6 (six) hours as needed for nausea or vomiting.        Verita Schneiders, MD, [place] Attending [place], [facility] for [employer], [place]

## 2016-10-17 ENCOUNTER — Inpatient Hospital Stay (HOSPITAL_COMMUNITY)
Admission: AD | Admit: 2016-10-17 | Discharge: 2016-10-17 | Disposition: A | Discharge status: Home / Self Care | Payer: Self-pay | Source: Ambulatory Visit | Attending: Family Medicine | Admitting: Family Medicine

## 2016-10-17 DIAGNOSIS — O209 Hemorrhage in early pregnancy, unspecified: Secondary | ICD-10-CM | POA: Insufficient documentation

## 2016-10-17 DIAGNOSIS — O99331 Smoking (tobacco) complicating pregnancy, first trimester: Secondary | ICD-10-CM | POA: Insufficient documentation

## 2016-10-17 DIAGNOSIS — R1030 Lower abdominal pain, unspecified: Secondary | ICD-10-CM | POA: Insufficient documentation

## 2016-10-17 DIAGNOSIS — F17290 Nicotine dependence, other tobacco product, uncomplicated: Secondary | ICD-10-CM | POA: Insufficient documentation

## 2016-10-17 DIAGNOSIS — O3411 Maternal care for benign tumor of corpus uteri, first trimester: Secondary | ICD-10-CM | POA: Insufficient documentation

## 2016-10-17 DIAGNOSIS — O26891 Other specified pregnancy related conditions, first trimester: Secondary | ICD-10-CM | POA: Insufficient documentation

## 2016-10-17 DIAGNOSIS — Z3A08 8 weeks gestation of pregnancy: Secondary | ICD-10-CM | POA: Insufficient documentation

## 2016-10-17 LAB — CBC
HCT: 41.4 % (ref 36.0–46.0)
HEMOGLOBIN: 14.3 g/dL (ref 12.0–15.0)
MCH: 31 pg (ref 26.0–34.0)
MCHC: 34.5 g/dL (ref 30.0–36.0)
MCV: 89.6 fL (ref 78.0–100.0)
Platelets: 264 10*3/uL (ref 150–400)
RBC: 4.62 MIL/uL (ref 3.87–5.11)
RDW: 14.5 % (ref 11.5–15.5)
WBC: 5.3 10*3/uL (ref 4.0–10.5)

## 2016-10-17 LAB — HCG, QUANTITATIVE, PREGNANCY: hCG, Beta Chain, Quant, S: 153744 m[IU]/mL — ABNORMAL HIGH (ref <5)

## 2016-10-17 NOTE — MAU Note (Signed)
Kerry Hough PA in Triage to see pt. Bedside u/s done by PA and FHR noted and pt reassured. Pt then d/c home from Triage.

## 2016-10-17 NOTE — MAU Provider Note (Signed)
  History     CSN: [id number]  Arrival date and time: 10/17/16 9937   First Provider Initiated Contact with Patient 10/17/16 2023      Chief Complaint  Patient presents with  . Vaginal Bleeding   HPI [name] Coil is a 21 y.o. G2P0 at [redacted]w[redacted]d who presents to MAU today with complaint of vaginal bleeding. The patient was seen in MAU on 10/15/16 and had IUP on [place]. She states that tonight around 1730 she noted light brown spotting once with wiping. She denies bright red bleeding or clots. She has mild lower abdominal pain unchanged from her last visit. She was told she could take Tylenol, but has not felt she needed it.   OB History    Gravida Para Term Preterm AB Living   2             SAB TAB Ectopic Multiple Live Births                  Past Medical History:  Diagnosis Date  . Fibroids     No past surgical history on file.  No family history on file.  Social History  Substance Use Topics  . Smoking status: Never Smoker  . Smokeless tobacco: Current User  . Alcohol use No    Allergies: No Known Allergies  Prescriptions Prior to Admission  Medication Sig Dispense Refill Last Dose  . Prenatal Vit-Fe Fumarate-FA (PRENATAL VITAMIN) 27-0.8 MG TABS Take 1 tablet by mouth daily. 30 tablet 12   . promethazine (PHENERGAN) 25 MG tablet Take 1 tablet (25 mg total) by mouth every 6 (six) hours as needed for nausea or vomiting. 30 tablet 2     Review of Systems  Gastrointestinal: Positive for abdominal pain.  Genitourinary: Positive for vaginal bleeding. Negative for vaginal discharge.   Physical Exam   Blood pressure 127/73, pulse 91, temperature 98.3 F (36.8 C), resp. rate 18, height 5\' 5"  (1.651 m), weight 172 lb (78 kg), last menstrual period 08/18/2016, unknown if currently breastfeeding.  Physical Exam  Nursing note and vitals reviewed. Constitutional: She is oriented to person, place, and time. She appears well-developed and well-nourished. No distress.   HENT:  Head: Normocephalic and atraumatic.  Cardiovascular: Normal rate.   Respiratory: Effort normal.  GI: Soft. She exhibits no distension and no mass. There is no tenderness. There is no rebound and no guarding.  Neurological: She is alert and oriented to person, place, and time.  Skin: Skin is warm and dry. No erythema.  Psychiatric: She has a normal mood and affect.    MAU Course  Procedures  MDM [place] reviewed from last visit in MAU. SIUP with normal FHR noted on [place].  Bedside US performed today. Limitation of the exam explained to the patient. SIUP noted with normal appearing FHR.  Patient reassured Assessment and Plan  A: SIUP at [redacted]w[redacted]d Spotting in pregnancy, first trimester   P:  Discharge home Tylenol PRN for pain First trimester precautions discussed Patient advised to follow-up with [facility] as planned to start prenatal care Patient may return to MAU as needed or if her condition were to change or worsen  Luvenia Redden, PA-C  10/17/2016, 8:23 PM

## 2016-10-17 NOTE — MAU Note (Addendum)
Went to [place] about 1730 and saw brown on tissue. Mild amt cramping in lower abd but states she has had this off and on and is nothing new and no worse.

## 2016-10-17 NOTE — Discharge Instructions (Signed)
Pelvic Rest Pelvic rest may be recommended if:  Your placenta is partially or completely covering the opening of your cervix (placenta previa).  There is bleeding between the wall of the uterus and the amniotic sac in the first trimester of pregnancy (subchorionic hemorrhage).  You went into labor too early (preterm labor). Based on your overall health and the health of your baby, your health care provider will decide if pelvic rest is right for you. How do I rest my pelvis? For as long as told by your health care provider:  Do not have sex, sexual stimulation, or an orgasm.  Do not use tampons. Do not douche. Do not put anything in your vagina.  Do not lift anything that is heavier than 10 lb (4.5 kg).  Avoid activities that take a lot of effort (are strenuous).  Avoid any activity in which your pelvic muscles could become strained. When should I seek medical care? Seek medical care if you have:  Cramping pain in your lower abdomen.  Vaginal discharge.  A low, dull backache.  Regular contractions.  Uterine tightening. When should I seek immediate medical care? Seek immediate medical care if:  You have vaginal bleeding and you are pregnant. This information is not intended to replace advice given to you by your health care provider. Make sure you discuss any questions you have with your health care provider. Document Released: 11/15/2010 Document Revised: 12/27/2015 Document Reviewed: 01/22/2015 Elsevier Interactive Patient Education  2017 Elsevier Inc.   Vaginal Bleeding During Pregnancy, First Trimester A small amount of bleeding (spotting) from the vagina is common in early pregnancy. Sometimes the bleeding is normal and is not a problem, and sometimes it is a sign of something serious. Be sure to tell your doctor about any bleeding from your vagina right away. Follow these instructions at home:  Watch your condition for any changes.  Follow your doctor's  instructions about how active you can be.  If you are on bed rest:  You may need to stay in bed and only get up to use the bathroom.  You may be allowed to do some activities.  If you need help, make plans for someone to help you.  Write down:  The number of pads you use each day.  How often you change pads.  How soaked (saturated) your pads are.  Do not use tampons.  Do not douche.  Do not have sex or orgasms until your doctor says it is okay.  If you pass any tissue from your vagina, save the tissue so you can show it to your doctor.  Only take medicines as told by your doctor.  Do not take aspirin because it can make you bleed.  Keep all follow-up visits as told by your doctor. Contact a doctor if:  You bleed from your vagina.  You have cramps.  You have labor pains.  You have a fever that does not go away after you take medicine. Get help right away if:  You have very bad cramps in your back or belly (abdomen).  You pass large clots or tissue from your vagina.  You bleed more.  You feel light-headed or weak.  You pass out (faint).  You have chills.  You are leaking fluid or have a gush of fluid from your vagina.  You pass out while pooping (having a bowel movement). This information is not intended to replace advice given to you by your health care provider. Make sure you discuss any  questions you have with your health care provider. Document Released: 12/05/2013 Document Revised: 12/27/2015 Document Reviewed: 03/28/2013 Elsevier Interactive Patient Education  2017 [employer].

## 2016-11-07 ENCOUNTER — Ambulatory Visit (INDEPENDENT_AMBULATORY_CARE_PROVIDER_SITE_OTHER): Payer: Self-pay | Admitting: Obstetrics and Gynecology

## 2016-11-07 ENCOUNTER — Encounter: Payer: Self-pay | Admitting: Obstetrics and Gynecology

## 2016-11-07 VITALS — BP 121/75 | HR 96 | Wt 170.0 lb

## 2016-11-07 DIAGNOSIS — Z3493 Encounter for supervision of normal pregnancy, unspecified, third trimester: Secondary | ICD-10-CM | POA: Insufficient documentation

## 2016-11-07 DIAGNOSIS — Z3491 Encounter for supervision of normal pregnancy, unspecified, first trimester: Secondary | ICD-10-CM

## 2016-11-07 DIAGNOSIS — D259 Leiomyoma of uterus, unspecified: Secondary | ICD-10-CM

## 2016-11-07 DIAGNOSIS — Z113 Encounter for screening for infections with a predominantly sexual mode of transmission: Secondary | ICD-10-CM

## 2016-11-07 DIAGNOSIS — O219 Vomiting of pregnancy, unspecified: Secondary | ICD-10-CM

## 2016-11-07 DIAGNOSIS — Z124 Encounter for screening for malignant neoplasm of cervix: Secondary | ICD-10-CM

## 2016-11-07 DIAGNOSIS — O3410 Maternal care for benign tumor of corpus uteri, unspecified trimester: Secondary | ICD-10-CM

## 2016-11-07 NOTE — Progress Notes (Signed)
New OB Note  11/07/2016   Clinic: [facility]  Chief Complaint: NOB  Transfer of Care Patient: no  History of Present Illness: [name] is a 21 y.o. G2P0010 @ 11/4 weeks ([place] 10/22, based on Patient's last menstrual period was 08/18/2016.=7wk u/s.  Preg complicated by has Fibroid uterus; Uterine fibroids affecting pregnancy, antepartum; and Nausea and vomiting of pregnancy, antepartum on her problem list.   Any events prior to today's visit: went to MAU x 2, once for pain and once for VB. Pt is Rh pos and negative evaluation.  Her periods were: regular, monthly after last SAB She was using no method when she conceived.  She has Positive signs or symptoms of nausea/vomiting of pregnancy. She has Negative signs or symptoms of miscarriage or preterm labor On any different medications around the time she conceived/early pregnancy: No   ROS: A 12-point review of systems was performed and negative, except as stated in the above HPI.  OBGYN History: As per HPI. OB History  Gravida Para Term Preterm AB Living  2       1    SAB TAB Ectopic Multiple Live Births  1            # Outcome Date GA Lbr Len/2nd Weight Sex Delivery Anes PTL Lv  2 Current           1 SAB 07/2016 [redacted]w[redacted]d             Any issues with any prior pregnancies: not applicable Prior children are healthy, doing well, and without any problems or issues: not applicable History of pap smears: No. History of STIs: No   Past Medical History: Past Medical History:  Diagnosis Date  . Fibroids     Past Surgical History: No past surgical history on file.  Family History:  She denies any female cancers, bleeding or blood clotting disorders.  She denies any history of mental retardation, birth defects or genetic disorders in her or the FOB's history  Social History:  Social History   Social History  . Marital status: Single    Spouse name: N/A  . Number of children: N/A  . Years of education:  N/A   Occupational History  . Not on file.   Social History Main Topics  . Smoking status: Never Smoker  . Smokeless tobacco: Current User  . Alcohol use No  . Drug use: No  . Sexual activity: Yes    Birth control/ protection: None   Other Topics Concern  . Not on file   Social History Narrative  . No narrative on file     Allergy: No Known Allergies  Health Maintenance:  Mammogram Up to Date: not applicable  Current Outpatient Medications: PNV, phenergan PRN (pt not taking b/c it makes her feel dizzy)  Physical Exam:   BP 121/75   Pulse 96   Wt 170 lb (77.1 kg)   LMP 08/18/2016   BMI 28.29 kg/m  Body mass index is 28.29 kg/m. FHTs: normal by bedside u/s  General appearance: Well nourished, well developed female in no acute distress.  Neck:  Supple, normal appearance, and no thyromegaly  Cardiovascular: S1, S2 normal, no murmur, rub or gallop, regular rate and rhythm Respiratory:  Clear to auscultation bilateral. Normal respiratory effort Abdomen: positive bowel sounds and no masses, hernias; diffusely non tender to palpation, non distended Breasts: breasts appear normal, no suspicious masses, no skin or nipple changes or axillary nodes, and normal palpation.  Neuro/Psych:  Normal mood and affect.  Skin:  Warm and dry.  Lymphatic:  No inguinal lymphadenopathy.   Pelvic exam: is not limited by body habitus EGBUS: within normal limits, Vagina: within normal limits and with no blood in the vault, Cervix: normal appearing cervix without discharge or lesions, closed/long/high, Uterus:  enlarged, c/w 10-12 week size, and Adnexa:  normal adnexa and no mass, fullness, tenderness  Laboratory: No new labs. Prior [place] screen negative  Imaging:  As above  Assessment: pt doing well  Plan: 1. Encounter for supervision of normal pregnancy in first trimester, unspecified gravidity Routine care. Pt would like genetic screening. Will do quad screen nv. Set up anatomy u/s  then too. Pt w/o insurance. D/w her to try 12.5mg  of phenergan instead of 25 and see if less SE with that and also can do benadryl and vitamin B6 OTC - Cytology - PAP - SMN1 Copy Number Analysis - Cystic Fibrosis Mutation 97 - Obstetric Panel, Including HIV - Urine Culture  2. Fibroids Follow up at anatomy u/s and depending on size and/or location, consider serial growth u/s during pregnancy  Problem list reviewed and updated.  Follow up in 4 weeks.   >50% of 25 min visit spent on counseling and coordination of care.     Durene Romans MD Attending Center for [facility])

## 2016-11-08 LAB — URINE CULTURE

## 2016-11-10 ENCOUNTER — Encounter: Payer: Self-pay | Admitting: Family Medicine

## 2016-11-11 LAB — CYTOLOGY - PAP
Chlamydia: NEGATIVE
DIAGNOSIS: NEGATIVE
NEISSERIA GONORRHEA: NEGATIVE
TRICH (WINDOWPATH): NEGATIVE

## 2016-11-14 LAB — OBSTETRIC PANEL, INCLUDING HIV
Antibody Screen: NEGATIVE
BASOS ABS: 0 10*3/uL (ref 0.0–0.2)
Basos: 0 %
EOS (ABSOLUTE): 0 10*3/uL (ref 0.0–0.4)
EOS: 1 %
HEMOGLOBIN: 13.5 g/dL (ref 11.1–15.9)
HEP B S AG: NEGATIVE
HIV Screen 4th Generation wRfx: NONREACTIVE
Hematocrit: 40.7 % (ref 34.0–46.6)
IMMATURE GRANS (ABS): 0 10*3/uL (ref 0.0–0.1)
IMMATURE GRANULOCYTES: 0 %
LYMPHS: 25 %
Lymphocytes Absolute: 1.2 10*3/uL (ref 0.7–3.1)
MCH: 29.5 pg (ref 26.6–33.0)
MCHC: 33.2 g/dL (ref 31.5–35.7)
MCV: 89 fL (ref 79–97)
MONOCYTES: 9 %
Monocytes Absolute: 0.4 10*3/uL (ref 0.1–0.9)
NEUTROS PCT: 65 %
Neutrophils Absolute: 3 10*3/uL (ref 1.4–7.0)
PLATELETS: 237 10*3/uL (ref 150–379)
RBC: 4.57 x10E6/uL (ref 3.77–5.28)
RDW: 14.3 % (ref 12.3–15.4)
RPR: NONREACTIVE
Rh Factor: POSITIVE
WBC: 4.7 10*3/uL (ref 3.4–10.8)

## 2016-11-14 LAB — SMN1 COPY NUMBER ANALYSIS (SMA CARRIER SCREENING)

## 2016-11-14 LAB — CYSTIC FIBROSIS MUTATION 97: Interpretation: NOT DETECTED

## 2016-11-17 ENCOUNTER — Inpatient Hospital Stay (HOSPITAL_COMMUNITY)
Admission: AD | Admit: 2016-11-17 | Discharge: 2016-11-18 | Disposition: A | Discharge status: Home / Self Care | Payer: Self-pay | Source: Ambulatory Visit | Attending: Obstetrics and Gynecology | Admitting: Obstetrics and Gynecology

## 2016-11-17 ENCOUNTER — Encounter (HOSPITAL_COMMUNITY): Payer: Self-pay

## 2016-11-17 DIAGNOSIS — B9789 Other viral agents as the cause of diseases classified elsewhere: Secondary | ICD-10-CM

## 2016-11-17 DIAGNOSIS — Z3A13 13 weeks gestation of pregnancy: Secondary | ICD-10-CM | POA: Insufficient documentation

## 2016-11-17 DIAGNOSIS — O99511 Diseases of the respiratory system complicating pregnancy, first trimester: Secondary | ICD-10-CM | POA: Insufficient documentation

## 2016-11-17 DIAGNOSIS — J069 Acute upper respiratory infection, unspecified: Secondary | ICD-10-CM | POA: Insufficient documentation

## 2016-11-17 DIAGNOSIS — R05 Cough: Secondary | ICD-10-CM | POA: Insufficient documentation

## 2016-11-17 LAB — URINALYSIS, ROUTINE W REFLEX MICROSCOPIC
Bilirubin Urine: NEGATIVE
Glucose, UA: 50 mg/dL — AB
Hgb urine dipstick: NEGATIVE
KETONES UR: NEGATIVE mg/dL
LEUKOCYTES UA: NEGATIVE
NITRITE: NEGATIVE
PH: 8 (ref 5.0–8.0)
Protein, ur: NEGATIVE mg/dL
SPECIFIC GRAVITY, URINE: 1.004 — AB (ref 1.005–1.030)

## 2016-11-17 MED ORDER — DM-GUAIFENESIN ER 30-600 MG PO TB12
1.0000 | ORAL_TABLET | Freq: Two times a day (BID) | ORAL | Status: DC
  Administered 2016-11-17: 1 via ORAL
  Filled 2016-11-17 (×2): qty 1

## 2016-11-17 NOTE — Discharge Instructions (Signed)

## 2016-11-17 NOTE — MAU Provider Note (Signed)
  History     CSN: [id number]  Arrival date and time: 11/17/16 2225   First Provider Initiated Contact with Patient 11/17/16 2305      Chief Complaint  Patient presents with  . Cough   Lisa Liu is a 21 y.o. G2P0010 at [redacted]w[redacted]d who presents today with cough and sore throat x 2 days. She reports that she was in contact recently with "someone who had a cough". She denies any fever, abdominal pain, or vaginal bleeding.    Cough  This is a new problem. The current episode started yesterday. The problem has been unchanged. The problem occurs hourly. The cough is non-productive. Associated symptoms include a sore throat. Pertinent negatives include no chills or fever. Associated symptoms comments: Hoarseness . Nothing aggravates the symptoms. She has tried nothing for the symptoms.     Past Medical History:  Diagnosis Date  . Fibroids     History reviewed. No pertinent surgical history.  No family history on file.  Social History  Substance Use Topics  . Smoking status: Never Smoker  . Smokeless tobacco: Current User  . Alcohol use No    Allergies: No Known Allergies  Prescriptions Prior to Admission  Medication Sig Dispense Refill Last Dose  . Prenatal Vit-Fe Fumarate-FA (PRENATAL VITAMIN) 27-0.8 MG TABS Take 1 tablet by mouth daily. 30 tablet 12 Taking  . promethazine (PHENERGAN) 25 MG tablet Take 1 tablet (25 mg total) by mouth every 6 (six) hours as needed for nausea or vomiting. 30 tablet 2 Taking    Review of Systems  Constitutional: Negative for chills and fever.  HENT: Positive for sore throat and voice change.   Respiratory: Positive for cough.   Genitourinary: Negative for pelvic pain, vaginal bleeding and vaginal discharge.   Physical Exam   Blood pressure 130/72, pulse (!) 111, temperature 98.1 F (36.7 C), resp. rate 18, height 5\' 5"  (1.651 m), weight 172 lb (78 kg), last menstrual period 08/18/2016, SpO2 100 %, unknown if currently  breastfeeding.  Physical Exam  Nursing note and vitals reviewed. Constitutional: She is oriented to person, place, and time. She appears well-developed and well-nourished. No distress.  HENT:  Head: Normocephalic.  Mouth/Throat: No oropharyngeal exudate, posterior oropharyngeal edema, posterior oropharyngeal erythema or tonsillar abscesses.  Cardiovascular: Normal rate.   Respiratory: Effort normal and breath sounds normal. No respiratory distress. She has no wheezes.  GI: Soft. There is no tenderness. There is no rebound.  Neurological: She is alert and oriented to person, place, and time.  Skin: Skin is warm and dry.  Psychiatric: She has a normal mood and affect.    FHT: 165 with doppler  MAU Course  Procedures  MDM Patient has had mucinex while here. Will DC home with list of safe medications to take.   Assessment and Plan   1. Viral URI with cough    DC home Comfort measures reviewed  2nd Trimester precautions  RX: none, list of safe OTC medications given  Return to MAU as needed FU with OB as planned  [place] [facility] [place] [facility] Follow up.   Specialty:  Obstetrics and Gynecology Contact information: [address] [place] [phone]           Mathis Bud 11/17/2016, 11:07 PM

## 2016-11-17 NOTE — MAU Note (Signed)
Pt c/o sore throat that started yesterday. States she developed a cough today and having some discomfort in chest when she coughs. Denies being around anyone sick. Denies fever Took Tylenol around 3pm-did not help.

## 2016-11-21 ENCOUNTER — Encounter (HOSPITAL_COMMUNITY): Payer: Self-pay

## 2016-11-21 ENCOUNTER — Inpatient Hospital Stay (HOSPITAL_COMMUNITY)
Admission: AD | Admit: 2016-11-21 | Discharge: 2016-11-21 | Disposition: A | Discharge status: Home / Self Care | Payer: Self-pay | Source: Ambulatory Visit | Attending: Family Medicine | Admitting: Family Medicine

## 2016-11-21 DIAGNOSIS — Z3A13 13 weeks gestation of pregnancy: Secondary | ICD-10-CM | POA: Insufficient documentation

## 2016-11-21 DIAGNOSIS — O3411 Maternal care for benign tumor of corpus uteri, first trimester: Secondary | ICD-10-CM | POA: Insufficient documentation

## 2016-11-21 DIAGNOSIS — O209 Hemorrhage in early pregnancy, unspecified: Secondary | ICD-10-CM

## 2016-11-21 DIAGNOSIS — D259 Leiomyoma of uterus, unspecified: Secondary | ICD-10-CM

## 2016-11-21 DIAGNOSIS — Z3491 Encounter for supervision of normal pregnancy, unspecified, first trimester: Secondary | ICD-10-CM

## 2016-11-21 LAB — URINALYSIS, ROUTINE W REFLEX MICROSCOPIC
Bilirubin Urine: NEGATIVE
GLUCOSE, UA: 50 mg/dL — AB
KETONES UR: NEGATIVE mg/dL
Leukocytes, UA: NEGATIVE
NITRITE: NEGATIVE
PROTEIN: NEGATIVE mg/dL
Specific Gravity, Urine: 1.027 (ref 1.005–1.030)
pH: 7 (ref 5.0–8.0)

## 2016-11-21 NOTE — MAU Note (Signed)
Patient presents to triage with c/o vaginal bleeding that just started back in the last hour. Noted by patient to be bright red when she wiped; endorses being seen previous in pregnancy for brown vaginal bleeding and was told to come back if anything changed. REporting lower abdominal cramping that comes and goes.

## 2016-11-21 NOTE — Progress Notes (Addendum)
G2P0 @ 13.4 wksgqa. Presents to triage for scant bright red bleeding on pad and toilet paper. States blood on toilet paper only when wiping.   Hx: fibroids top and right area of abdomen.   1841: provider at bs.

## 2016-11-21 NOTE — MAU Provider Note (Signed)
History     CSN: [id number]  Arrival date and time: 11/21/16 1746  First Provider Initiated Contact with Patient 11/21/16 [place]      Chief Complaint  Patient presents with  . Vaginal Bleeding   HPI Lisa Liu is a 21 y.o. G2P0010 at [redacted]w[redacted]d who presents with vaginal bleeding. Symptoms began this afternoon. Noted bright red spotting on toilet paper x 2 episodes today. Denies abdominal pain. Current pregnancy complicated by fibroid uterus. No recent intercourse.   OB History    Gravida Para Term Preterm AB Living   2       1     SAB TAB Ectopic Multiple Live Births   1              Past Medical History:  Diagnosis Date  . Fibroids     History reviewed. No pertinent surgical history.  Family History  Problem Relation Age of Onset  . Cancer Father   . Varicose Veins Father   . Diabetes Paternal Grandmother   . Hyperlipidemia Paternal Grandmother     Social History  Substance Use Topics  . Smoking status: Never Smoker  . Smokeless tobacco: Never Used  . Alcohol use No    Allergies: No Known Allergies  Prescriptions Prior to Admission  Medication Sig Dispense Refill Last Dose  . Prenatal Vit-Fe Fumarate-FA (PRENATAL VITAMIN) 27-0.8 MG TABS Take 1 tablet by mouth daily. 30 tablet 12 Taking  . promethazine (PHENERGAN) 25 MG tablet Take 1 tablet (25 mg total) by mouth every 6 (six) hours as needed for nausea or vomiting. 30 tablet 2 Taking    Review of Systems  Constitutional: Negative.   Gastrointestinal: Negative.   Genitourinary: Positive for vaginal bleeding. Negative for dysuria and vaginal discharge.   Physical Exam   Blood pressure 123/67, pulse 85, temperature 98.1 F (36.7 C), temperature source Oral, resp. rate 18, height 5\' 5"  (1.651 m), weight 168 lb 1.3 oz (76.2 kg), last menstrual period 08/18/2016, SpO2 100 %, unknown if currently breastfeeding.  Physical Exam  Nursing note and vitals reviewed. Constitutional: She is oriented to person,  place, and time. She appears well-developed and well-nourished. No distress.  HENT:  Head: Normocephalic and atraumatic.  Eyes: Conjunctivae are normal. Right eye exhibits no discharge. Left eye exhibits no discharge. No scleral icterus.  Neck: Normal range of motion.  Cardiovascular: Normal rate, regular rhythm and normal heart sounds.   No murmur heard. Respiratory: Effort normal and breath sounds normal. No respiratory distress. She has no wheezes.  GI: Soft. There is no tenderness.  Genitourinary: Uterus is enlarged. Cervix exhibits no friability. There is bleeding (small amount of brown blood; no active bleeding) in the vagina.  Genitourinary Comments: Cervix closed/thick  Neurological: She is alert and oriented to person, place, and time.  Skin: Skin is warm and dry. She is not diaphoretic.  Psychiatric: She has a normal mood and affect. Her behavior is normal. Judgment and thought content normal.    MAU Course  Procedures Results for orders placed or performed during the hospital encounter of 11/21/16 (from the past 24 hour(s))  Urinalysis, Routine w reflex microscopic     Status: Abnormal   Collection Time: 11/21/16  6:00 PM  Result Value Ref Range   Color, Urine YELLOW YELLOW   APPearance HAZY (A) CLEAR   Specific Gravity, Urine 1.027 1.005 - 1.030   pH 7.0 5.0 - 8.0   Glucose, UA 50 (A) NEGATIVE mg/dL   Hgb urine dipstick  MODERATE (A) NEGATIVE   Bilirubin Urine NEGATIVE NEGATIVE   Ketones, ur NEGATIVE NEGATIVE mg/dL   Protein, ur NEGATIVE NEGATIVE mg/dL   Nitrite NEGATIVE NEGATIVE   Leukocytes, UA NEGATIVE NEGATIVE   RBC / HPF 0-5 0 - 5 RBC/hpf   WBC, UA 0-5 0 - 5 WBC/hpf   Bacteria, UA RARE (A) NONE SEEN   Squamous Epithelial / LPF 0-5 (A) NONE SEEN   Mucous PRESENT     MDM A positive FHT 160 Cervix closed Assessment and Plan  A; 1. Vaginal bleeding in pregnancy, first trimester   2. Fetal heart tones present, first trimester   3. 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

## 2016-11-21 NOTE — Discharge Instructions (Signed)
Vaginal Bleeding During Pregnancy, First Trimester A small amount of bleeding (spotting) from the vagina is relatively common in early pregnancy. It usually stops on its own. Various things may cause bleeding or spotting in early pregnancy. Some bleeding may be related to the pregnancy, and some may not. In most cases, the bleeding is normal and is not a problem. However, bleeding can also be a sign of something serious. Be sure to tell your health care provider about any vaginal bleeding right away. Some possible causes of vaginal bleeding during the first trimester include:  Infection or inflammation of the cervix.  Growths (polyps) on the cervix.  Miscarriage or threatened miscarriage.  Pregnancy tissue has developed outside of the uterus and in a fallopian tube (tubal pregnancy).  Tiny cysts have developed in the uterus instead of pregnancy tissue (molar pregnancy). Follow these instructions at home: Watch your condition for any changes. The following actions may help to lessen any discomfort you are feeling:  Follow your health care provider's instructions for limiting your activity. If your health care provider orders bed rest, you may need to stay in bed and only get up to use the bathroom. However, your health care provider may allow you to continue light activity.  If needed, make plans for someone to help with your regular activities and responsibilities while you are on bed rest.  Keep track of the number of pads you use each day, how often you change pads, and how soaked (saturated) they are. Write this down.  Do not use tampons. Do not douche.  Do not have sexual intercourse or orgasms until approved by your health care provider.  If you pass any tissue from your vagina, save the tissue so you can show it to your health care provider.  Only take over-the-counter or prescription medicines as directed by your health care provider.  Do not take aspirin because it can make you  bleed.  Keep all follow-up appointments as directed by your health care provider. Contact a health care provider if:  You have any vaginal bleeding during any part of your pregnancy.  You have cramps or labor pains.  You have a fever, not controlled by medicine. Get help right away if:  You have severe cramps in your back or belly (abdomen).  You pass large clots or tissue from your vagina.  Your bleeding increases.  You feel light-headed or weak, or you have fainting episodes.  You have chills.  You are leaking fluid or have a gush of fluid from your vagina.  You pass out while having a bowel movement. This information is not intended to replace advice given to you by your health care provider. Make sure you discuss any questions you have with your health care provider. Document Released: 04/30/2005 Document Revised: 12/27/2015 Document Reviewed: 03/28/2013 Elsevier Interactive Patient Education  2017 [employer].

## 2016-12-05 ENCOUNTER — Ambulatory Visit (INDEPENDENT_AMBULATORY_CARE_PROVIDER_SITE_OTHER): Payer: Self-pay | Admitting: Family Medicine

## 2016-12-05 VITALS — BP 124/74 | HR 106 | Wt 167.0 lb

## 2016-12-05 DIAGNOSIS — O219 Vomiting of pregnancy, unspecified: Secondary | ICD-10-CM

## 2016-12-05 DIAGNOSIS — Z3402 Encounter for supervision of normal first pregnancy, second trimester: Secondary | ICD-10-CM

## 2016-12-05 MED ORDER — METOCLOPRAMIDE HCL 10 MG PO TABS: 10.0000 mg | ORAL_TABLET | Freq: Three times a day (TID) | ORAL | 3 refills | Status: DC

## 2016-12-05 NOTE — Patient Instructions (Addendum)
Breastfeeding Deciding to breastfeed is one of the best choices you can make for you and your baby. A change in hormones during pregnancy causes your breast tissue to grow and increases the number and size of your milk ducts. These hormones also allow proteins, sugars, and fats from your blood supply to make breast milk in your milk-producing glands. Hormones prevent breast milk from being released before your baby is born as well as prompt milk flow after birth. Once breastfeeding has begun, thoughts of your baby, as well as his or her sucking or crying, can stimulate the release of milk from your milk-producing glands. Benefits of breastfeeding For Your Baby  Your first milk (colostrum) helps your baby's digestive system function better.  There are antibodies in your milk that help your baby fight off infections.  Your baby has a lower incidence of asthma, allergies, and sudden infant death syndrome.  The nutrients in breast milk are better for your baby than infant formulas and are designed uniquely for your baby's needs.  Breast milk improves your baby's brain development.  Your baby is less likely to develop other conditions, such as childhood obesity, asthma, or type 2 diabetes mellitus.  For You  Breastfeeding helps to create a very special bond between you and your baby.  Breastfeeding is convenient. Breast milk is always available at the correct temperature and costs nothing.  Breastfeeding helps to burn calories and helps you lose the weight gained during pregnancy.  Breastfeeding makes your uterus contract to its prepregnancy size faster and slows bleeding (lochia) after you give birth.  Breastfeeding helps to lower your risk of developing type 2 diabetes mellitus, osteoporosis, and breast or ovarian cancer later in life.  Signs that your baby is hungry Early Signs of Hunger  Increased alertness or activity.  Stretching.  Movement of the head from side to  side.  Movement of the head and opening of the mouth when the corner of the mouth or cheek is stroked (rooting).  Increased sucking sounds, smacking lips, cooing, sighing, or squeaking.  Hand-to-mouth movements.  Increased sucking of fingers or hands.  Late Signs of Hunger  Fussing.  Intermittent crying.  Extreme Signs of Hunger Signs of extreme hunger will require calming and consoling before your baby will be able to breastfeed successfully. Do not wait for the following signs of extreme hunger to occur before you initiate breastfeeding:  Restlessness.  A loud, strong cry.  Screaming.  Breastfeeding basics Breastfeeding Initiation  Find a comfortable place to sit or lie down, with your neck and back well supported.  Place a pillow or rolled up blanket under your baby to bring him or her to the level of your breast (if you are seated). Nursing pillows are specially designed to help support your arms and your baby while you breastfeed.  Make sure that your baby's abdomen is facing your abdomen.  Gently massage your breast. With your fingertips, massage from your chest wall toward your nipple in a circular motion. This encourages milk flow. You may need to continue this action during the feeding if your milk flows slowly.  Support your breast with 4 fingers underneath and your thumb above your nipple. Make sure your fingers are well away from your nipple and your baby's mouth.  Stroke your baby's lips gently with your finger or nipple.  When your baby's mouth is open wide enough, quickly bring your baby to your breast, placing your entire nipple and as much of the colored area   around your nipple (areola) as possible into your baby's mouth. ? More areola should be visible above your baby's upper lip than below the lower lip. ? Your baby's tongue should be between his or her lower gum and your breast.  Ensure that your baby's mouth is correctly positioned around your nipple  (latched). Your baby's lips should create a seal on your breast and be turned out (everted).  It is common for your baby to suck about 2-3 minutes in order to start the flow of breast milk.  Latching Teaching your baby how to latch on to your breast properly is very important. An improper latch can cause nipple pain and decreased milk supply for you and poor weight gain in your baby. Also, if your baby is not latched onto your nipple properly, he or she may swallow some air during feeding. This can make your baby fussy. Burping your baby when you switch breasts during the feeding can help to get rid of the air. However, teaching your baby to latch on properly is still the best way to prevent fussiness from swallowing air while breastfeeding. Signs that your baby has successfully latched on to your nipple:  Silent tugging or silent sucking, without causing you pain.  Swallowing heard between every 3-4 sucks.  Muscle movement above and in front of his or her ears while sucking.  Signs that your baby has not successfully latched on to nipple:  Sucking sounds or smacking sounds from your baby while breastfeeding.  Nipple pain.  If you think your baby has not latched on correctly, slip your finger into the corner of your baby's mouth to break the suction and place it between your baby's gums. Attempt breastfeeding initiation again. Signs of Successful Breastfeeding Signs from your baby:  A gradual decrease in the number of sucks or complete cessation of sucking.  Falling asleep.  Relaxation of his or her body.  Retention of a small amount of milk in his or her mouth.  Letting go of your breast by himself or herself.  Signs from you:  Breasts that have increased in firmness, weight, and size 1-3 hours after feeding.  Breasts that are softer immediately after breastfeeding.  Increased milk volume, as well as a change in milk consistency and color by the fifth day of  breastfeeding.  Nipples that are not sore, cracked, or bleeding.  Signs That Your Baby is Getting Enough Milk  Wetting at least 1-2 diapers during the first 24 hours after birth.  Wetting at least 5-6 diapers every 24 hours for the first week after birth. The urine should be clear or pale yellow by 5 days after birth.  Wetting 6-8 diapers every 24 hours as your baby continues to grow and develop.  At least 3 stools in a 24-hour period by age 5 days. The stool should be soft and yellow.  At least 3 stools in a 24-hour period by age 7 days. The stool should be seedy and yellow.  No loss of weight greater than 10% of birth weight during the first 3 days of age.  Average weight gain of 4-7 ounces (113-198 g) per week after age 4 days.  Consistent daily weight gain by age 5 days, without weight loss after the age of 2 weeks.  After a feeding, your baby may spit up a small amount. This is common. Breastfeeding frequency and duration Frequent feeding will help you make more milk and can prevent sore nipples and breast engorgement. Breastfeed when   you feel the need to reduce the fullness of your breasts or when your baby shows signs of hunger. This is called "breastfeeding on demand." Avoid introducing a pacifier to your baby while you are working to establish breastfeeding (the first 4-6 weeks after your baby is born). After this time you may choose to use a pacifier. Research has shown that pacifier use during the first year of a baby's life decreases the risk of sudden infant death syndrome (SIDS). Allow your baby to feed on each breast as long as he or she wants. Breastfeed until your baby is finished feeding. When your baby unlatches or falls asleep while feeding from the first breast, offer the second breast. Because newborns are often sleepy in the first few weeks of life, you may need to awaken your baby to get him or her to feed. Breastfeeding times will vary from baby to baby. However,  the following rules can serve as a guide to help you ensure that your baby is properly fed:  Newborns (babies 4 weeks of age or younger) may breastfeed every 1-3 hours.  Newborns should not go longer than 3 hours during the day or 5 hours during the night without breastfeeding.  You should breastfeed your baby a minimum of 8 times in a 24-hour period until you begin to introduce solid foods to your baby at around 6 months of age.  Breast milk pumping Pumping and storing breast milk allows you to ensure that your baby is exclusively fed your breast milk, even at times when you are unable to breastfeed. This is especially important if you are going back to work while you are still breastfeeding or when you are not able to be present during feedings. Your lactation consultant can give you guidelines on how long it is safe to store breast milk. A breast pump is a machine that allows you to pump milk from your breast into a sterile bottle. The pumped breast milk can then be stored in a refrigerator or freezer. Some breast pumps are operated by hand, while others use electricity. Ask your lactation consultant which type will work best for you. Breast pumps can be purchased, but some hospitals and breastfeeding support groups lease breast pumps on a monthly basis. A lactation consultant can teach you how to hand express breast milk, if you prefer not to use a pump. Caring for your breasts while you breastfeed Nipples can become dry, cracked, and sore while breastfeeding. The following recommendations can help keep your breasts moisturized and healthy:  Avoid using soap on your nipples.  Wear a supportive bra. Although not required, special nursing bras and tank tops are designed to allow access to your breasts for breastfeeding without taking off your entire bra or top. Avoid wearing underwire-style bras or extremely tight bras.  Air dry your nipples for 3-4minutes after each feeding.  Use only cotton  bra pads to absorb leaked breast milk. Leaking of breast milk between feedings is normal.  Use lanolin on your nipples after breastfeeding. Lanolin helps to maintain your skin's normal moisture barrier. If you use pure lanolin, you do not need to wash it off before feeding your baby again. Pure lanolin is not toxic to your baby. You may also hand express a few drops of breast milk and gently massage that milk into your nipples and allow the milk to air dry.  In the first few weeks after giving birth, some women experience extremely full breasts (engorgement). Engorgement can make your   Engorgement peaks within 3-5 days after you give birth. The following recommendations can help ease engorgement:  Completely empty your breasts while breastfeeding or pumping. You may want to start by applying warm, moist heat (in the shower or with warm water-soaked hand towels) just before feeding or pumping. This increases circulation and helps the milk flow. If your baby does not completely empty your breasts while breastfeeding, pump any extra milk after he or she is finished.  Wear a snug bra (nursing or regular) or tank top for 1-2 days to signal your body to slightly decrease milk production.  Apply ice packs to your breasts, unless this is too uncomfortable for you.  Make sure that your baby is latched on and positioned properly while breastfeeding. If engorgement persists after 48 hours of following these recommendations, contact your health care provider or a Science writer. Overall health care recommendations while breastfeeding  Eat healthy foods. Alternate between meals and snacks, eating 3 of each per day. Because what you eat affects your breast milk, some of the foods may make your baby more irritable than usual. Avoid eating these foods if you are sure that they are negatively affecting your baby.  Drink milk, fruit juice, and water to satisfy your thirst (about 10 glasses a day).  Rest often,  relax, and continue to take your prenatal vitamins to prevent fatigue, stress, and anemia.  Continue breast self-awareness checks.  Avoid chewing and smoking tobacco. Chemicals from cigarettes that pass into breast milk and exposure to secondhand smoke may harm your baby.  Avoid alcohol and drug use, including marijuana. Some medicines that may be harmful to your baby can pass through breast milk. It is important to ask your health care provider before taking any medicine, including all over-the-counter and prescription medicine as well as vitamin and herbal supplements. It is possible to become pregnant while breastfeeding. If birth control is desired, ask your health care provider about options that will be safe for your baby. Contact a health care provider if:  You feel like you want to stop breastfeeding or have become frustrated with breastfeeding.  You have painful breasts or nipples.  Your nipples are cracked or bleeding.  Your breasts are red, tender, or warm.  You have a swollen area on either breast.  You have a fever or chills.  You have nausea or vomiting.  You have drainage other than breast milk from your nipples.  Your breasts do not become full before feedings by the fifth day after you give birth.  You feel sad and depressed.  Your baby is too sleepy to eat well.  Your baby is having trouble sleeping.  Your baby is wetting less than 3 diapers in a 24-hour period.  Your baby has less than 3 stools in a 24-hour period.  Your baby's skin or the white part of his or her eyes becomes yellow.  Your baby is not gaining weight by 46 days of age. Get help right away if:  Your baby is overly tired (lethargic) and does not want to wake up and feed.  Your baby develops an unexplained fever. This information is not intended to replace advice given to you by your health care provider. Make sure you discuss any questions you have with your health care  provider. Document Released: 07/21/2005 Document Revised: 01/02/2016 Document Reviewed: 01/12/2013 Elsevier Interactive Patient Education  2017 [place] of Pregnancy The second trimester is from week 13 through week 28, month 4 through  6. This is often the time in pregnancy that you feel your best. Often times, morning sickness has lessened or quit. You may have more energy, and you may get hungry more often. Your unborn baby (fetus) is growing rapidly. At the end of the sixth month, he or she is about 9 inches long and weighs about 1 pounds. You will likely feel the baby move (quickening) between 18 and 20 weeks of pregnancy. Follow these instructions at home:  Avoid all smoking, herbs, and alcohol. Avoid drugs not approved by your doctor.  Do not use any tobacco products, including cigarettes, chewing tobacco, and electronic cigarettes. If you need help quitting, ask your doctor. You may get counseling or other support to help you quit.  Only take medicine as told by your doctor. Some medicines are safe and some are not during pregnancy.  Exercise only as told by your doctor. Stop exercising if you start having cramps.  Eat regular, healthy meals.  Wear a good support bra if your breasts are tender.  Do not use hot tubs, steam rooms, or saunas.  Wear your seat belt when driving.  Avoid raw meat, uncooked cheese, and liter boxes and soil used by cats.  Take your prenatal vitamins.  Take 1500-2000 milligrams of calcium daily starting at the 20th week of pregnancy until you deliver your baby.  Try taking medicine that helps you poop (stool softener) as needed, and if your doctor approves. Eat more fiber by eating fresh fruit, vegetables, and whole grains. Drink enough fluids to keep your pee (urine) clear or pale yellow.  Take warm water baths (sitz baths) to soothe pain or discomfort caused by hemorrhoids. Use hemorrhoid cream if your doctor approves.  If you  have puffy, bulging veins (varicose veins), wear support hose. Raise (elevate) your feet for 15 minutes, 3-4 times a day. Limit salt in your diet.  Avoid heavy lifting, wear low heals, and sit up straight.  Rest with your legs raised if you have leg cramps or low back pain.  Visit your dentist if you have not gone during your pregnancy. Use a soft toothbrush to brush your teeth. Be gentle when you floss.  You can have sex (intercourse) unless your doctor tells you not to.  Go to your doctor visits. Get help if:  You feel dizzy.  You have mild cramps or pressure in your lower belly (abdomen).  You have a nagging pain in your belly area.  You continue to feel sick to your stomach (nauseous), throw up (vomit), or have watery poop (diarrhea).  You have bad smelling fluid coming from your vagina.  You have pain with peeing (urination). Get help right away if:  You have a fever.  You are leaking fluid from your vagina.  You have spotting or bleeding from your vagina.  You have severe belly cramping or pain.  You lose or gain weight rapidly.  You have trouble catching your breath and have chest pain.  You notice sudden or extreme puffiness (swelling) of your face, hands, ankles, feet, or legs.  You have not felt the baby move in over an hour.  You have severe headaches that do not go away with medicine.  You have vision changes. This information is not intended to replace advice given to you by your health care provider. Make sure you discuss any questions you have with your health care provider. Document Released: 10/15/2009 Document Revised: 12/27/2015 Document Reviewed: 09/21/2012 Elsevier Interactive Patient Education  2017 [employer]  

## 2016-12-05 NOTE — Progress Notes (Signed)
   PRENATAL VISIT NOTE  Subjective:  Lisa Liu is a 21 y.o. G2P0010 at [redacted]w[redacted]d being seen today for ongoing prenatal care.  She is currently monitored for the following issues for this low-risk pregnancy and has Fibroid uterus; Uterine fibroids affecting pregnancy, antepartum; Nausea and vomiting of pregnancy, antepartum; and Supervision of normal pregnancy on her problem list.  Patient reports vomiting.  Contractions: Not present. Vag. Bleeding: None.  Movement: Present. Denies leaking of fluid.   The following portions of the patient's history were reviewed and updated as appropriate: allergies, current medications, past family history, past medical history, past social history, past surgical history and problem list. Problem list updated.  Objective:   Vitals:   12/05/16 1006  BP: 124/74  Pulse: (!) 106  Weight: 167 lb (75.8 kg)    Fetal Status: Fetal Heart Rate (bpm): 154   Movement: Present     General:  Alert, oriented and cooperative. Patient is in no acute distress.  Skin: Skin is warm and dry. No rash noted.   Cardiovascular: Normal heart rate noted  Respiratory: Normal respiratory effort, no problems with respiration noted  Abdomen: Soft, gravid, appropriate for gestational age. Pain/Pressure: Absent     Pelvic:  Cervical exam deferred        Extremities: Normal range of motion.  Edema: None  Mental Status: Normal mood and affect. Normal behavior. Normal judgment and thought content.   Assessment and Plan:  Pregnancy: G2P0010 at [redacted]w[redacted]d  1. Encounter for supervision of normal first pregnancy in second trimester Continue routine prenatal care.  - AFP, Quad Screen - [place] [facility] OB COMP + 14 WK; Future  2. Nausea and vomiting of pregnancy, antepartum Phenergan makes her dizzy, wants to try something else - metoCLOPramide (REGLAN) 10 MG tablet; Take 1 tablet (10 mg total) by mouth 4 (four) times daily -  before meals and at bedtime.  Dispense: 120 tablet; Refill:  3  General obstetric precautions including but not limited to vaginal bleeding, contractions, leaking of fluid and fetal movement were reviewed in detail with the patient. Please refer to After Visit Summary for other counseling recommendations.  Return in 5 weeks (on 01/09/2017).   Donnamae Jude, MD

## 2016-12-09 ENCOUNTER — Inpatient Hospital Stay (HOSPITAL_COMMUNITY)
Admission: AD | Admit: 2016-12-09 | Discharge: 2016-12-09 | Disposition: A | Discharge status: Home / Self Care | Payer: Self-pay | Source: Ambulatory Visit | Attending: Family Medicine | Admitting: Family Medicine

## 2016-12-09 ENCOUNTER — Encounter (HOSPITAL_COMMUNITY): Payer: Self-pay | Admitting: *Deleted

## 2016-12-09 DIAGNOSIS — Z3492 Encounter for supervision of normal pregnancy, unspecified, second trimester: Secondary | ICD-10-CM

## 2016-12-09 DIAGNOSIS — O26892 Other specified pregnancy related conditions, second trimester: Secondary | ICD-10-CM | POA: Insufficient documentation

## 2016-12-09 DIAGNOSIS — O9989 Other specified diseases and conditions complicating pregnancy, childbirth and the puerperium: Secondary | ICD-10-CM

## 2016-12-09 DIAGNOSIS — M549 Dorsalgia, unspecified: Secondary | ICD-10-CM

## 2016-12-09 DIAGNOSIS — Z79899 Other long term (current) drug therapy: Secondary | ICD-10-CM | POA: Insufficient documentation

## 2016-12-09 DIAGNOSIS — O99891 Other specified diseases and conditions complicating pregnancy: Secondary | ICD-10-CM

## 2016-12-09 DIAGNOSIS — R1031 Right lower quadrant pain: Secondary | ICD-10-CM | POA: Insufficient documentation

## 2016-12-09 DIAGNOSIS — Z3A16 16 weeks gestation of pregnancy: Secondary | ICD-10-CM | POA: Insufficient documentation

## 2016-12-09 LAB — CBC WITH DIFFERENTIAL/PLATELET
BASOS ABS: 0 10*3/uL (ref 0.0–0.1)
BASOS PCT: 0 %
EOS ABS: 0 10*3/uL (ref 0.0–0.7)
EOS PCT: 0 %
HEMATOCRIT: 39 % (ref 36.0–46.0)
Hemoglobin: 13.6 g/dL (ref 12.0–15.0)
Lymphocytes Relative: 19 %
Lymphs Abs: 1.3 10*3/uL (ref 0.7–4.0)
MCH: 30.3 pg (ref 26.0–34.0)
MCHC: 34.9 g/dL (ref 30.0–36.0)
MCV: 86.9 fL (ref 78.0–100.0)
MONO ABS: 0.2 10*3/uL (ref 0.1–1.0)
MONOS PCT: 3 %
NEUTROS ABS: 5 10*3/uL (ref 1.7–7.7)
Neutrophils Relative %: 78 %
PLATELETS: 227 10*3/uL (ref 150–400)
RBC: 4.49 MIL/uL (ref 3.87–5.11)
RDW: 13.8 % (ref 11.5–15.5)
WBC: 6.5 10*3/uL (ref 4.0–10.5)

## 2016-12-09 LAB — URINALYSIS, ROUTINE W REFLEX MICROSCOPIC
BILIRUBIN URINE: NEGATIVE
GLUCOSE, UA: NEGATIVE mg/dL
HGB URINE DIPSTICK: NEGATIVE
KETONES UR: NEGATIVE mg/dL
LEUKOCYTES UA: NEGATIVE
Nitrite: NEGATIVE
PH: 5 (ref 5.0–8.0)
Protein, ur: NEGATIVE mg/dL
Specific Gravity, Urine: 1.019 (ref 1.005–1.030)

## 2016-12-09 MED ORDER — IBUPROFEN 600 MG PO TABS
600.0000 mg | ORAL_TABLET | Freq: Once | ORAL | Status: AC
  Administered 2016-12-09: 600 mg via ORAL
  Filled 2016-12-09: qty 1

## 2016-12-09 NOTE — Discharge Instructions (Signed)
Back Pain in Pregnancy Back pain during pregnancy is common. Back pain may be caused by several factors that are related to changes during your pregnancy. Follow these instructions at home: Managing pain, stiffness, and swelling   If directed, apply ice for sudden (acute) back pain.  Put ice in a plastic bag.  Place a towel between your skin and the bag.  Leave the ice on for 20 minutes, 2-3 times per day.  If directed, apply heat to the affected area before you exercise:  Place a towel between your skin and the heat pack or heating pad.  Leave the heat on for 20-30 minutes.  Remove the heat if your skin turns bright red. This is especially important if you are unable to feel pain, heat, or cold. You may have a greater risk of getting burned. Activity   Exercise as told by your health care provider. Exercising is the best way to prevent or manage back pain.  Listen to your body when lifting. If lifting hurts, ask for help or bend your knees. This uses your leg muscles instead of your back muscles.  Squat down when picking up something from the floor. Do not bend over.  Only use bed rest as told by your health care provider. Bed rest should only be used for the most severe episodes of back pain. Standing, Sitting, and Lying Down   Do not stand in one place for long periods of time.  Use good posture when sitting. Make sure your head rests over your shoulders and is not hanging forward. Use a pillow on your lower back if necessary.  Try sleeping on your side, preferably the left side, with a pillow or two between your legs. If you are sore after a night's rest, your bed may be too soft. A firm mattress may provide more support for your back during pregnancy. General instructions   Do not wear high heels.  Eat a healthy diet. Try to gain weight within your health care provider's recommendations.  Use a maternity girdle, elastic sling, or back brace as told by your health care  provider.  Take over-the-counter and prescription medicines only as told by your health care provider.  Keep all follow-up visits as told by your health care provider. This is important. This includes any visits with any specialists, such as a physical therapist. Contact a health care provider if:  Your back pain interferes with your daily activities.  You have increasing pain in other parts of your body. Get help right away if:  You develop numbness, tingling, weakness, or problems with the use of your arms or legs.  You develop severe back pain that is not controlled with medicine.  You have a sudden change in bowel or bladder control.  You develop shortness of breath, dizziness, or you faint.  You develop nausea, vomiting, or sweating.  You have back pain that is a rhythmic, cramping pain similar to labor pains. Labor pain is usually 1-2 minutes apart, lasts for about 1 minute, and involves a bearing down feeling or pressure in your pelvis.  You have back pain and your water breaks or you have vaginal bleeding.  You have back pain or numbness that travels down your leg.  Your back pain developed after you fell.  You develop pain on one side of your back.  You see blood in your urine.  You develop skin blisters in the area of your back pain. This information is not intended to replace  advice given to you by your health care provider. Make sure you discuss any questions you have with your health care provider. Document Released: 10/29/2005 Document Revised: 12/27/2015 Document Reviewed: 04/04/2015 Elsevier Interactive Patient Education  2017 [employer].

## 2016-12-09 NOTE — MAU Note (Signed)
Patient c/o mid and lower right sided back pain that started yesterday. Pain comes and goes.

## 2016-12-09 NOTE — MAU Provider Note (Signed)
History     CSN: [id number]  Arrival date and time: 12/09/16 1914  First Provider Initiated Contact with Patient 12/09/16 1021   Chief Complaint  Patient presents with  . Abdominal Pain  . Back Pain   HPI Lisa Liu is a 21 y.o. G2P0010 at [redacted]w[redacted]d who presents with right flank pain. Symptoms began yesterday with RLQ pain; pain has shifted to right side & flank since last night. Pain is constant. Rates pain 8/10. Took a dose of tylenol yesterday without relief. Nothing makes pain worse. Has had n/v throughout pregnancy; has vomited once today. Denies abdominal pain, dysuria, hematuria, urinary frequency, fever/chills, vaginal bleeding.   OB History    Gravida Para Term Preterm AB Living   2       1     SAB TAB Ectopic Multiple Live Births   1              Past Medical History:  Diagnosis Date  . Fibroids     History reviewed. No pertinent surgical history.  Family History  Problem Relation Age of Onset  . Cancer Father   . Varicose Veins Father   . Diabetes Paternal Grandmother   . Hyperlipidemia Paternal Grandmother     Social History  Substance Use Topics  . Smoking status: Never Smoker  . Smokeless tobacco: Never Used  . Alcohol use No    Allergies: No Known Allergies  Prescriptions Prior to Admission  Medication Sig Dispense Refill Last Dose  . acetaminophen (TYLENOL) 325 MG tablet Take 325 mg by mouth every 6 (six) hours as needed for mild pain or headache.   12/08/2016 at Unknown time  . Prenatal Vit-Fe Fumarate-FA (PRENATAL VITAMIN) 27-0.8 MG TABS Take 1 tablet by mouth daily. 30 tablet 12 12/08/2016 at Unknown time  . metoCLOPramide (REGLAN) 10 MG tablet Take 1 tablet (10 mg total) by mouth 4 (four) times daily -  before meals and at bedtime. (Patient not taking: Reported on 12/09/2016) 120 tablet 3 Not Taking at Unknown time    Review of Systems  Constitutional: Positive for unexpected weight change (d/t n/v throughout pregnancy). Negative for chills  and fever.  Gastrointestinal: Positive for nausea and vomiting. Negative for abdominal pain (none today), constipation and diarrhea.  Genitourinary: Positive for flank pain. Negative for difficulty urinating, dysuria, frequency, hematuria, vaginal bleeding and vaginal discharge.  Musculoskeletal: Positive for back pain.   Physical Exam   Blood pressure 116/69, pulse 90, temperature 97.8 F (36.6 C), temperature source Oral, resp. rate 18, height 5\' 5"  (1.651 m), weight 166 lb 0.6 oz (75.3 kg), last menstrual period 08/18/2016, SpO2 100 %, unknown if currently breastfeeding.  Physical Exam  Nursing note and vitals reviewed. Constitutional: She is oriented to person, place, and time. She appears well-developed and well-nourished. No distress.  HENT:  Head: Normocephalic and atraumatic.  Eyes: Conjunctivae are normal. Right eye exhibits no discharge. Left eye exhibits no discharge. No scleral icterus.  Neck: Normal range of motion.  Cardiovascular: Normal rate, regular rhythm and normal heart sounds.   No murmur heard. Respiratory: Effort normal and breath sounds normal. No respiratory distress. She has no wheezes.  GI: Soft. Bowel sounds are normal. There is CVA tenderness (right). There is no rigidity and no guarding.  Neurological: She is alert and oriented to person, place, and time.  Skin: Skin is warm and dry. She is not diaphoretic.  Psychiatric: She has a normal mood and affect. Her behavior is normal. Judgment and thought  content normal.    MAU Course  Procedures Results for orders placed or performed during the hospital encounter of 12/09/16 (from the past 24 hour(s))  Urinalysis, Routine w reflex microscopic     Status: Abnormal   Collection Time: 12/09/16  9:14 AM  Result Value Ref Range   Color, Urine AMBER (A) YELLOW   APPearance CLOUDY (A) CLEAR   Specific Gravity, Urine 1.019 1.005 - 1.030   pH 5.0 5.0 - 8.0   Glucose, UA NEGATIVE NEGATIVE mg/dL   Hgb urine dipstick  NEGATIVE NEGATIVE   Bilirubin Urine NEGATIVE NEGATIVE   Ketones, ur NEGATIVE NEGATIVE mg/dL   Protein, ur NEGATIVE NEGATIVE mg/dL   Nitrite NEGATIVE NEGATIVE   Leukocytes, UA NEGATIVE NEGATIVE  CBC with Differential/Platelet     Status: None (Preliminary result)   Collection Time: 12/09/16 11:29 AM  Result Value Ref Range   WBC 6.5 4.0 - 10.5 K/uL   RBC 4.49 3.87 - 5.11 MIL/uL   Hemoglobin 13.6 12.0 - 15.0 g/dL   HCT 39.0 36.0 - 46.0 %   MCV 86.9 78.0 - 100.0 fL   MCH 30.3 26.0 - 34.0 pg   MCHC 34.9 30.0 - 36.0 g/dL   RDW 13.8 11.5 - 15.5 %   Platelets 227 150 - 400 K/uL   Neutrophils Relative % 78 %   Neutro Abs 5.0 1.7 - 7.7 K/uL   Lymphocytes Relative 19 %   Lymphs Abs 1.3 0.7 - 4.0 K/uL   Monocytes Relative 3 %   Monocytes Absolute 0.2 0.1 - 1.0 K/uL   Eosinophils Relative 0 %   Eosinophils Absolute 0.0 0.0 - 0.7 K/uL   Basophils Relative 0 %   Basophils Absolute 0.0 0.0 - 0.1 K/uL   Other PENDING %    MDM FHT 145 CBC w/diff -- no leukocytosis Negative u/a Ibuprofen & hot pack to back -- pt reports resolution of symptoms  Assessment and Plan  A; 1. Back pain affecting pregnancy in second trimester   2. Fetal heart tones present, second trimester    P: Discharge home Discussed reasons to return to MAU -- including s/s of kidney infection/stones Discussed tx of pain including otc meds & heat therapy Keep f/u with OB or call as needed  Jorje Guild 12/09/2016, 10:21 AM

## 2016-12-11 LAB — AFP, QUAD SCREEN
DIA MOM VALUE: 1.4
DIA VALUE (EIA): 228.59 pg/mL
DSR (By Age)    1 IN: 1108
DSR (Second Trimester) 1 IN: 6694
Gestational Age: 15.6 WEEKS
MATERNAL AGE AT EDD: 22.8 a
MSAFP Mom: 0.91
MSAFP: 26.1 ng/mL
MSHCG MOM: 3.7
MSHCG: 148213 m[IU]/mL
Osb Risk: 10000
Test Results:: NEGATIVE
UE3 MOM: 1.28
UE3 VALUE: 0.84 ng/mL
Weight: 167 [lb_av]

## 2016-12-23 ENCOUNTER — Inpatient Hospital Stay (HOSPITAL_COMMUNITY)
Admission: AD | Admit: 2016-12-23 | Discharge: 2016-12-24 | Disposition: A | Discharge status: Home / Self Care | Payer: Self-pay | Source: Ambulatory Visit | Attending: Family Medicine | Admitting: Family Medicine

## 2016-12-23 ENCOUNTER — Encounter (HOSPITAL_COMMUNITY): Payer: Self-pay

## 2016-12-23 DIAGNOSIS — O3412 Maternal care for benign tumor of corpus uteri, second trimester: Secondary | ICD-10-CM | POA: Insufficient documentation

## 2016-12-23 DIAGNOSIS — D259 Leiomyoma of uterus, unspecified: Secondary | ICD-10-CM | POA: Insufficient documentation

## 2016-12-23 DIAGNOSIS — O3410 Maternal care for benign tumor of corpus uteri, unspecified trimester: Secondary | ICD-10-CM

## 2016-12-23 DIAGNOSIS — O26892 Other specified pregnancy related conditions, second trimester: Secondary | ICD-10-CM | POA: Insufficient documentation

## 2016-12-23 DIAGNOSIS — R12 Heartburn: Secondary | ICD-10-CM

## 2016-12-23 DIAGNOSIS — Z3A18 18 weeks gestation of pregnancy: Secondary | ICD-10-CM | POA: Insufficient documentation

## 2016-12-23 DIAGNOSIS — R109 Unspecified abdominal pain: Secondary | ICD-10-CM | POA: Insufficient documentation

## 2016-12-23 DIAGNOSIS — O219 Vomiting of pregnancy, unspecified: Secondary | ICD-10-CM | POA: Insufficient documentation

## 2016-12-23 LAB — URINALYSIS, ROUTINE W REFLEX MICROSCOPIC
BILIRUBIN URINE: NEGATIVE
Glucose, UA: NEGATIVE mg/dL
HGB URINE DIPSTICK: NEGATIVE
Ketones, ur: NEGATIVE mg/dL
LEUKOCYTES UA: NEGATIVE
NITRITE: NEGATIVE
PH: 5 (ref 5.0–8.0)
Protein, ur: 30 mg/dL — AB
SPECIFIC GRAVITY, URINE: 1.029 (ref 1.005–1.030)

## 2016-12-23 MED ORDER — GI COCKTAIL ~~LOC~~
30.0000 mL | ORAL | Status: AC
  Administered 2016-12-24: 30 mL via ORAL
  Filled 2016-12-23: qty 30

## 2016-12-23 MED ORDER — ONDANSETRON 8 MG PO TBDP
8.0000 mg | ORAL_TABLET | ORAL | Status: AC
Start: 2016-12-23 — End: 2016-12-24
  Administered 2016-12-24: 8 mg via ORAL
  Filled 2016-12-23: qty 1

## 2016-12-23 NOTE — MAU Note (Signed)
Lower abd pain since yesterday. Vomited x 3 days and saw clots in vomit bright red dime size clot then second time brown clot in vomit. Baby moving. No vag bleeding or leaking.

## 2016-12-23 NOTE — MAU Provider Note (Signed)
Chief Complaint: Emesis and Abdominal Pain   None     SUBJECTIVE HPI: Lisa Liu is a 21 y.o. G2P0010 at [redacted]w[redacted]d by LMP who presents to maternity admissions reporting vomiting today which is not unusual for her but she saw streaks of pink blood in the emesis. She takes Reglan for nausea and for heartburn but it does not help as much now as it did a few weeks ago. She also reports low abdominal constant cramping pain x 24 hours. This is a new problem. The pain is unchanged since onset. She has taken Tylenol which helps but then the pain returns.  There are no associated symptoms.  She denies vaginal bleeding, vaginal itching/burning, urinary symptoms, h/a, dizziness, n/v, or fever/chills.     HPI  Past Medical History:  Diagnosis Date  . Fibroids    History reviewed. No pertinent surgical history. Social History   Social History  . Marital status: Single    Spouse name: N/A  . Number of children: N/A  . Years of education: N/A   Occupational History  . Not on file.   Social History Main Topics  . Smoking status: Never Smoker  . Smokeless tobacco: Never Used  . Alcohol use No  . Drug use: No  . Sexual activity: Yes    Birth control/ protection: None   Other Topics Concern  . Not on file   Social History Narrative  . No narrative on file   No current facility-administered medications on file prior to encounter.    Current Outpatient Prescriptions on File Prior to Encounter  Medication Sig Dispense Refill  . acetaminophen (TYLENOL) 325 MG tablet Take 325 mg by mouth every 6 (six) hours as needed for mild pain or headache.     No Known Allergies  ROS:  Review of Systems  Constitutional: Negative for chills, fatigue and fever.  Respiratory: Negative for shortness of breath.   Cardiovascular: Negative for chest pain.  Gastrointestinal: Positive for nausea and vomiting.       Hematemesis  Genitourinary: Positive for pelvic pain. Negative for difficulty  urinating, dysuria, flank pain, vaginal bleeding, vaginal discharge and vaginal pain.  Neurological: Negative for dizziness and headaches.  Psychiatric/Behavioral: Negative.      I have reviewed patient's Past Medical Hx, Surgical Hx, Family Hx, Social Hx, medications and allergies.   Physical Exam   Patient Vitals for the past 24 hrs:  BP Temp Temp src Pulse Resp SpO2 Height Weight  12/24/16 0215 - - - - 18 - - -  12/23/16 2240 117/69 98.5 F (36.9 C) Oral 100 20 98 % 5\' 5"  (1.651 m) 167 lb 12 oz (76.1 kg)   Constitutional: Well-developed, well-nourished female in no acute distress.  Cardiovascular: normal rate Respiratory: normal effort GI: Abd soft, non-tender. Pos BS x 4 MS: Extremities nontender, no edema, normal ROM Neurologic: Alert and oriented x 4.  GU: Neg CVAT.  Dilation: Closed Effacement (%): Thick Exam by:: Danelle Berry CNM   FHT 150 by doppler  LAB RESULTS Results for orders placed or performed during the hospital encounter of 12/23/16 (from the past 24 hour(s))  Urinalysis, Routine w reflex microscopic     Status: Abnormal   Collection Time: 12/23/16 10:48 PM  Result Value Ref Range   Color, Urine AMBER (A) YELLOW   APPearance CLEAR CLEAR   Specific Gravity, Urine 1.029 1.005 - 1.030   pH 5.0 5.0 - 8.0   Glucose, UA NEGATIVE NEGATIVE mg/dL   Hgb urine  dipstick NEGATIVE NEGATIVE   Bilirubin Urine NEGATIVE NEGATIVE   Ketones, ur NEGATIVE NEGATIVE mg/dL   Protein, ur 30 (A) NEGATIVE mg/dL   Nitrite NEGATIVE NEGATIVE   Leukocytes, UA NEGATIVE NEGATIVE   RBC / HPF 0-5 0 - 5 RBC/hpf   WBC, UA 0-5 0 - 5 WBC/hpf   Bacteria, UA RARE (A) NONE SEEN   Squamous Epithelial / LPF 0-5 (A) NONE SEEN   Mucous PRESENT     A/Positive/-- (04/06 0945)  IMAGING No results found.  MAU Management/MDM: Ordered labs (UA) and reviewed results.  Likely hematemesis from acid reflux.  Will give Zofran ODT and GI Cocktail in MAU. Pt reports improvement in nausea and  heartburn after medications.  Cervix closed/thick/high/firm, so no signs of preterm labor today.  Pain may be related to fibroid uterus.  Rest/ice/heat/warm bath/Tylenol for pain. Rx for Phenergan and Zantac.  F/U at [facility] as scheduled, return to MAU as needed for emergencies. Pt stable at time of discharge.  ASSESSMENT 1. Nausea and vomiting during pregnancy   2. Heartburn during pregnancy in second trimester   3. Abdominal pain during pregnancy in second trimester   4. Uterine fibroids affecting pregnancy, antepartum     PLAN Discharge home Allergies as of 12/24/2016   No Known Allergies     Medication List    STOP taking these medications   metoCLOPramide 10 MG tablet Commonly known as:  REGLAN   Prenatal Vitamin 27-0.8 MG Tabs     TAKE these medications   acetaminophen 325 MG tablet Commonly known as:  TYLENOL Take 325 mg by mouth every 6 (six) hours as needed for mild pain or headache.   promethazine 25 MG tablet Commonly known as:  PHENERGAN Take 0.5-1 tablets (12.5-25 mg total) by mouth every 6 (six) hours as needed.   ranitidine 150 MG tablet Commonly known as:  ZANTAC Take 1 tablet (150 mg total) by mouth 2 (two) times daily.      Follow-up [place] [facility] Follow up.   Specialty:  Obstetrics and Gynecology Why:  As scheduled, return to MAU as needed for emergencies. Contact information: [address] Certified Nurse-Midwife 12/24/2016  3:06 AM

## 2016-12-24 DIAGNOSIS — O219 Vomiting of pregnancy, unspecified: Secondary | ICD-10-CM

## 2016-12-24 MED ORDER — PROMETHAZINE HCL 25 MG PO TABS: 12.5000 mg | ORAL_TABLET | Freq: Four times a day (QID) | ORAL | Status: DC | PRN

## 2016-12-24 MED ORDER — PROMETHAZINE HCL 25 MG PO TABS: 12.5000 mg | ORAL_TABLET | Freq: Four times a day (QID) | ORAL | 2 refills | Status: DC | PRN

## 2016-12-24 MED ORDER — RANITIDINE HCL 150 MG PO TABS: 150.0000 mg | ORAL_TABLET | Freq: Two times a day (BID) | ORAL | 2 refills | Status: DC

## 2016-12-24 NOTE — Discharge Instructions (Signed)

## 2017-01-05 ENCOUNTER — Other Ambulatory Visit: Payer: Self-pay | Admitting: Family Medicine

## 2017-01-05 ENCOUNTER — Ambulatory Visit (HOSPITAL_COMMUNITY)
Admission: RE | Admit: 2017-01-05 | Discharge: 2017-01-05 | Disposition: A | Discharge status: Home / Self Care | Payer: Self-pay | Source: Ambulatory Visit | Attending: Family Medicine | Admitting: Family Medicine

## 2017-01-05 DIAGNOSIS — Z363 Encounter for antenatal screening for malformations: Secondary | ICD-10-CM

## 2017-01-05 DIAGNOSIS — Z3A20 20 weeks gestation of pregnancy: Secondary | ICD-10-CM | POA: Insufficient documentation

## 2017-01-05 DIAGNOSIS — Z3402 Encounter for supervision of normal first pregnancy, second trimester: Secondary | ICD-10-CM

## 2017-01-05 DIAGNOSIS — O3412 Maternal care for benign tumor of corpus uteri, second trimester: Secondary | ICD-10-CM

## 2017-01-05 DIAGNOSIS — D259 Leiomyoma of uterus, unspecified: Secondary | ICD-10-CM | POA: Insufficient documentation

## 2017-01-09 ENCOUNTER — Ambulatory Visit (INDEPENDENT_AMBULATORY_CARE_PROVIDER_SITE_OTHER): Payer: Self-pay | Admitting: Obstetrics and Gynecology

## 2017-01-09 VITALS — BP 111/75 | HR 91 | Wt 164.0 lb

## 2017-01-09 DIAGNOSIS — O3412 Maternal care for benign tumor of corpus uteri, second trimester: Secondary | ICD-10-CM

## 2017-01-09 DIAGNOSIS — O0992 Supervision of high risk pregnancy, unspecified, second trimester: Secondary | ICD-10-CM

## 2017-01-09 DIAGNOSIS — O3410 Maternal care for benign tumor of corpus uteri, unspecified trimester: Secondary | ICD-10-CM

## 2017-01-09 DIAGNOSIS — D259 Leiomyoma of uterus, unspecified: Secondary | ICD-10-CM

## 2017-01-09 NOTE — Progress Notes (Signed)
Prenatal Visit Note Date: 01/09/2017 Clinic: [facility]  Subjective:  Lisa Liu is a 21 y.o. G2P0010 at [redacted]w[redacted]d being seen today for ongoing prenatal care.  She is currently monitored for the following issues for this high-risk pregnancy and has Fibroid uterus; Uterine fibroids affecting pregnancy, antepartum; Nausea and vomiting of pregnancy, antepartum; and Supervision of high risk pregnancy, antepartum, second trimester on her problem list.  Patient reports still has nausea and vomiting but much improved and has mild nausea daily and maybe once daily vomiting.   Contractions: Not present. Vag. Bleeding: None.  Movement: Present. Denies leaking of fluid.   The following portions of the patient's history were reviewed and updated as appropriate: allergies, current medications, past family history, past medical history, past social history, past surgical history and problem list. Problem list updated.  Objective:   Vitals:   01/09/17 1018  BP: 111/75  Pulse: 91  Weight: 164 lb (74.4 kg)    Fetal Status: Fetal Heart Rate (bpm): 143   Movement: Present     General:  Alert, oriented and cooperative. Patient is in no acute distress.  Skin: Skin is warm and dry. No rash noted.   Cardiovascular: Normal heart rate noted  Respiratory: Normal respiratory effort, no problems with respiration noted  Abdomen: Soft, gravid, appropriate for gestational age. Pain/Pressure: Absent     Pelvic:  Cervical exam deferred        Extremities: Normal range of motion.  Edema: None  Mental Status: Normal mood and affect. Normal behavior. Normal judgment and thought content.   Urinalysis:      Assessment and Plan:  Pregnancy: G2P0010 at [redacted]w[redacted]d  1. Supervision of high risk pregnancy, antepartum, second trimester Routine care. On reglan - [place] [facility] OB FOLLOW UP; Future  2. Uterine fibroids affecting pregnancy, antepartum Repeat surveillance u/s per mfm - [place] [facility]  FOLLOW UP; Future  Preterm labor symptoms and general obstetric precautions including but not limited to vaginal bleeding, contractions, leaking of fluid and fetal movement were reviewed in detail with the patient. Please refer to After Visit Summary for other counseling recommendations.  Return in about 3 weeks (around 01/30/2017).   Aletha Halim, MD

## 2017-01-11 ENCOUNTER — Inpatient Hospital Stay (HOSPITAL_COMMUNITY)
Admission: AD | Admit: 2017-01-11 | Discharge: 2017-01-11 | Disposition: A | Discharge status: Home / Self Care | Payer: Self-pay | Source: Ambulatory Visit | Attending: Obstetrics & Gynecology | Admitting: Obstetrics & Gynecology

## 2017-01-11 ENCOUNTER — Encounter (HOSPITAL_COMMUNITY): Payer: Self-pay

## 2017-01-11 DIAGNOSIS — O9989 Other specified diseases and conditions complicating pregnancy, childbirth and the puerperium: Secondary | ICD-10-CM

## 2017-01-11 DIAGNOSIS — Z809 Family history of malignant neoplasm, unspecified: Secondary | ICD-10-CM | POA: Insufficient documentation

## 2017-01-11 DIAGNOSIS — R109 Unspecified abdominal pain: Secondary | ICD-10-CM | POA: Insufficient documentation

## 2017-01-11 DIAGNOSIS — Z8249 Family history of ischemic heart disease and other diseases of the circulatory system: Secondary | ICD-10-CM | POA: Insufficient documentation

## 2017-01-11 DIAGNOSIS — D259 Leiomyoma of uterus, unspecified: Secondary | ICD-10-CM | POA: Insufficient documentation

## 2017-01-11 DIAGNOSIS — O26892 Other specified pregnancy related conditions, second trimester: Secondary | ICD-10-CM | POA: Insufficient documentation

## 2017-01-11 DIAGNOSIS — Z79899 Other long term (current) drug therapy: Secondary | ICD-10-CM | POA: Insufficient documentation

## 2017-01-11 DIAGNOSIS — Z833 Family history of diabetes mellitus: Secondary | ICD-10-CM | POA: Insufficient documentation

## 2017-01-11 DIAGNOSIS — Z3A20 20 weeks gestation of pregnancy: Secondary | ICD-10-CM | POA: Insufficient documentation

## 2017-01-11 DIAGNOSIS — O3412 Maternal care for benign tumor of corpus uteri, second trimester: Secondary | ICD-10-CM | POA: Insufficient documentation

## 2017-01-11 LAB — URINALYSIS, ROUTINE W REFLEX MICROSCOPIC
BILIRUBIN URINE: NEGATIVE
GLUCOSE, UA: NEGATIVE mg/dL
Hgb urine dipstick: NEGATIVE
Ketones, ur: NEGATIVE mg/dL
Leukocytes, UA: NEGATIVE
Nitrite: NEGATIVE
PH: 8 (ref 5.0–8.0)
PROTEIN: NEGATIVE mg/dL
Specific Gravity, Urine: 1.015 (ref 1.005–1.030)

## 2017-01-11 MED ORDER — IBUPROFEN 600 MG PO TABS: 600.0000 mg | ORAL_TABLET | Freq: Four times a day (QID) | ORAL | 0 refills | Status: DC | PRN

## 2017-01-11 MED ORDER — IBUPROFEN 600 MG PO TABS
600.0000 mg | ORAL_TABLET | Freq: Once | ORAL | Status: AC
  Administered 2017-01-11: 600 mg via ORAL
  Filled 2017-01-11: qty 1

## 2017-01-11 NOTE — MAU Provider Note (Signed)
Chief Complaint: Abdominal Pain   First Provider Initiated Contact with Patient 01/11/17 0936     SUBJECTIVE HPI: Lisa Liu is a 21 y.o. G2P0010 at [redacted]w[redacted]d who presents to Maternity Admissions reporting abd pain since yesterday morning, 01/10/17.  Location: Low abd Quality: cramping Severity: 8/10 on pain scale Duration: 24 hours Context: None Timing: constant Modifying factors: None. Associated signs and symptoms: Neg for fever, chills, VB, LOF, vaginal discharge, urinary complaints, GI complaints. No injuries.   Had at least 12 fibroids, largest measuring 8 cm per last [place] 01/05/17. CL 3.97 cm  Past Medical History:  Diagnosis Date  . Fibroids    OB History  Gravida Para Term Preterm AB Living  2       1    SAB TAB Ectopic Multiple Live Births  1            # Outcome Date GA Lbr Len/2nd Weight Sex Delivery Anes PTL Lv  2 Current           1 SAB 07/2016 [redacted]w[redacted]d            History reviewed. No pertinent surgical history. Social History   Social History  . Marital status: Single    Spouse name: N/A  . Number of children: N/A  . Years of education: N/A   Occupational History  . Not on file.   Social History Main Topics  . Smoking status: Never Smoker  . Smokeless tobacco: Never Used  . Alcohol use No  . Drug use: No  . Sexual activity: Yes    Birth control/ protection: None   Other Topics Concern  . Not on file   Social History Narrative  . No narrative on file   Family History  Problem Relation Age of Onset  . Cancer Father   . Varicose Veins Father   . Diabetes Paternal Grandmother   . Hyperlipidemia Paternal Grandmother    No current facility-administered medications on file prior to encounter.    Current Outpatient Prescriptions on File Prior to Encounter  Medication Sig Dispense Refill  . acetaminophen (TYLENOL) 325 MG tablet Take 325 mg by mouth every 6 (six) hours as needed for mild pain or headache.    . promethazine (PHENERGAN) 25 MG  tablet Take 0.5-1 tablets (12.5-25 mg total) by mouth every 6 (six) hours as needed. 30 tablet 2  . ranitidine (ZANTAC) 150 MG tablet Take 1 tablet (150 mg total) by mouth 2 (two) times daily. (Patient not taking: Reported on 01/11/2017) 60 tablet 2   No Known Allergies  I have reviewed patient's Past Medical Hx, Surgical Hx, Family Hx, Social Hx, medications and allergies.   Review of Systems  Constitutional: Negative for activity change, appetite change, chills and fever.  Gastrointestinal: Positive for abdominal pain. Negative for abdominal distention, constipation, diarrhea, nausea and vomiting.  Genitourinary: Negative for difficulty urinating, dysuria, flank pain, frequency, hematuria, urgency, vaginal bleeding, vaginal discharge and vaginal pain.  Musculoskeletal: Negative for back pain.    OBJECTIVE Patient Vitals for the past 24 hrs:  BP Temp Temp src Pulse Resp SpO2 Height Weight  01/11/17 0818 117/63 99 F (37.2 C) Oral 85 17 95 % 5\' 5"  (1.651 m) 164 lb (74.4 kg)   Constitutional: Well-developed, well-nourished female in no acute distress.  Cardiovascular: normal rate Respiratory: normal rate and effort.  GI: Abd soft, non-tender, uterus firm, irregular contour, gravid S>D. Pos BS x 4 MS: Extremities nontender, no edema, normal ROM Neurologic: Alert and oriented  x 4.  GU:  PELVIC EXAM: NEFG, physiologic discharge, no blood noted, cervix closed/long/-3/firm, uterus 25-week size, no adnexal tenderness or masses. No CMT.  FHR 145 by doppler.   LAB RESULTS Results for orders placed or performed during the hospital encounter of 01/11/17 (from the past 24 hour(s))  Urinalysis, Routine w reflex microscopic     Status: None   Collection Time: 01/11/17  8:04 AM  Result Value Ref Range   Color, Urine YELLOW YELLOW   APPearance CLEAR CLEAR   Specific Gravity, Urine 1.015 1.005 - 1.030   pH 8.0 5.0 - 8.0   Glucose, UA NEGATIVE NEGATIVE mg/dL   Hgb urine dipstick NEGATIVE  NEGATIVE   Bilirubin Urine NEGATIVE NEGATIVE   Ketones, ur NEGATIVE NEGATIVE mg/dL   Protein, ur NEGATIVE NEGATIVE mg/dL   Nitrite NEGATIVE NEGATIVE   Leukocytes, UA NEGATIVE NEGATIVE    IMAGING NA  MAU COURSE Orders Placed This Encounter  Procedures  . Urinalysis, Routine w reflex microscopic  . Discharge patient   Meds ordered this encounter  Medications  . Prenatal Vit-Fe Fumarate-FA (PRENATAL MULTIVITAMIN) TABS tablet    Sig: Take 1 tablet by mouth at bedtime.  Marland Kitchen ibuprofen (ADVIL,MOTRIN) tablet 600 mg  . ibuprofen (ADVIL,MOTRIN) 600 MG tablet    Sig: Take 1 tablet (600 mg total) by mouth every 6 (six) hours as needed for moderate pain. Use sparingly. Do not use after [redacted] weeks gestation.    Dispense:  30 tablet    Refill:  0    Order Specific Question:   Supervising Provider    Answer:   LEGGETT, KELLY H [[id number]]   Pain much better after Ibuprofen and heat.  MDM - Abd pain in pregnancy most likely RLP vs fibroid pain. No evidence of preterm labor or other emergent condition. Benign exam.   ASSESSMENT 1. Abdominal pain during pregnancy in second trimester   2. Uterine fibroids affecting pregnancy, antepartum, second trimester     PLAN Discharge home in stable condition. abd pain precautions Ibuprofen, heat, maternity support belt. Follow-up Information    [facility] Follow up in 8 week(s).   Why:  as scheduled or sooner as needed if symptoms worsen Contact information: [address] [place] [address] [place] Follow up.   Why:  in emergencies Contact information: [address] [id number] [place] [phone]         Allergies as of 01/11/2017   No Known Allergies     Medication List    STOP taking these medications   ranitidine 150 MG tablet Commonly known as:  ZANTAC     TAKE these medications    acetaminophen 325 MG tablet Commonly known as:  TYLENOL Take 325 mg by mouth every 6 (six) hours as needed for mild pain or headache.   ibuprofen 600 MG tablet Commonly known as:  ADVIL,MOTRIN Take 1 tablet (600 mg total) by mouth every 6 (six) hours as needed for moderate pain. Use sparingly. Do not use after [redacted] weeks gestation.   prenatal multivitamin Tabs tablet Take 1 tablet by mouth at bedtime.   promethazine 25 MG tablet Commonly known as:  PHENERGAN Take 0.5-1 tablets (12.5-25 mg total) by mouth every 6 (six) hours as needed.        Tamala Julian, [place], [place] 01/11/2017  10:54 AM

## 2017-01-11 NOTE — MAU Note (Signed)
Pt c/o lower abdominal pain that started yesterday. Pt states the pain is constant. Pt denies bleeding and leaking.

## 2017-01-11 NOTE — Discharge Instructions (Signed)
Abdominal Pain During Pregnancy Abdominal pain is common in pregnancy. Most of the time, it does not cause harm. There are many causes of abdominal pain. Some causes are more serious than others and sometimes the cause is not known. Abdominal pain can be a sign that something is very wrong with the pregnancy or the pain may have nothing to do with the pregnancy. Always tell your health care provider if you have any abdominal pain. Follow these instructions at home:  Do not have sex or put anything in your vagina until your symptoms go away completely.  Watch your abdominal pain for any changes.  Get plenty of rest until your pain improves.  Drink enough fluid to keep your urine clear or pale yellow.  Take over-the-counter or prescription medicines only as told by your health care provider.  Keep all follow-up visits as told by your health care provider. This is important. Contact a health care provider if:  You have a fever.  Your pain gets worse or you have cramping.  Your pain continues after resting. Get help right away if:  You are bleeding, leaking fluid, or passing tissue from the vagina.  You have vomiting or diarrhea that does not go away.  You have painful or bloody urination.  You notice a decrease in your baby's movements.  You feel very weak or faint.  You have shortness of breath.  You develop a severe headache with abdominal pain.  You have abnormal vaginal discharge with abdominal pain. This information is not intended to replace advice given to you by your health care provider. Make sure you discuss any questions you have with your health care provider. Document Released: 07/21/2005 Document Revised: 05/01/2016 Document Reviewed: 02/17/2013 Elsevier Interactive Patient Education  2018 [employer].   [employer] of the uterus can occur throughout pregnancy, but they are not always a sign that you are in labor. You may have  practice contractions called Braxton Hicks contractions. These false labor contractions are sometimes confused with true labor. What are [place] contractions? Braxton Hicks contractions are tightening movements that occur in the muscles of the uterus before labor. Unlike true labor contractions, these contractions do not result in opening (dilation) and thinning of the cervix. Toward the end of pregnancy (32-34 weeks), Braxton Hicks contractions can happen more often and may become stronger. These contractions are sometimes difficult to tell apart from true labor because they can be very uncomfortable. You should not feel embarrassed if you go to the hospital with false labor. Sometimes, the only way to tell if you are in true labor is for your health care provider to look for changes in the cervix. The health care provider will do a physical exam and may monitor your contractions. If you are not in true labor, the exam should show that your cervix is not dilating and your water has not broken. If there are no prenatal problems or other health problems associated with your pregnancy, it is completely safe for you to be sent home with false labor. You may continue to have Braxton Hicks contractions until you go into true labor. How can I tell the difference between true labor and false labor?  Differences ? False labor ? Contractions last 30-70 seconds.: Contractions are usually shorter and not as strong as true labor contractions. ? Contractions become very regular.: Contractions are usually irregular. ? Discomfort is usually felt in the top of the uterus, and it spreads to the lower abdomen  and low back.: Contractions are often felt in the front of the lower abdomen and in the groin. ? Contractions do not go away with walking.: Contractions may go away when you walk around or change positions while lying down. ? Contractions usually become more intense and increase in frequency.: Contractions get  weaker and are shorter-lasting as time goes on. ? The cervix dilates and gets thinner.: The cervix usually does not dilate or become thin. Follow these instructions at home:  Take over-the-counter and prescription medicines only as told by your health care provider.  Keep up with your usual exercises and follow other instructions from your health care provider.  Eat and drink lightly if you think you are going into labor.  If Braxton Hicks contractions are making you uncomfortable: ? Change your position from lying down or resting to walking, or change from walking to resting. ? Sit and rest in a tub of warm water. ? Drink enough fluid to keep your urine clear or pale yellow. Dehydration may cause these contractions. ? Do slow and deep breathing several times an hour.  Keep all follow-up prenatal visits as told by your health care provider. This is important. Contact a health care provider if:  You have a fever.  You have continuous pain in your abdomen. Get help right away if:  Your contractions become stronger, more regular, and closer together.  You have fluid leaking or gushing from your vagina.  You pass blood-tinged mucus (bloody show).  You have bleeding from your vagina.  You have low back pain that you never had before.  You feel your babys head pushing down and causing pelvic pressure.  Your baby is not moving inside you as much as it used to. Summary  Contractions that occur before labor are called Braxton Hicks contractions, false labor, or practice contractions.  Braxton Hicks contractions are usually shorter, weaker, farther apart, and less regular than true labor contractions. True labor contractions usually become progressively stronger and regular and they become more frequent.  Manage discomfort from [facility] contractions by changing position, resting in a warm bath, drinking plenty of water, or practicing deep breathing. This information is not  intended to replace advice given to you by your health care provider. Make sure you discuss any questions you have with your health care provider. Document Released: 07/21/2005 Document Revised: 06/09/2016 Document Reviewed: 06/09/2016 Elsevier Interactive Patient Education  2017 [employer].

## 2017-02-05 ENCOUNTER — Encounter (HOSPITAL_COMMUNITY): Payer: Self-pay | Admitting: *Deleted

## 2017-02-05 ENCOUNTER — Inpatient Hospital Stay (HOSPITAL_COMMUNITY)
Admission: AD | Admit: 2017-02-05 | Discharge: 2017-02-05 | Disposition: A | Discharge status: Home / Self Care | Payer: Self-pay | Source: Ambulatory Visit | Attending: Obstetrics and Gynecology | Admitting: Obstetrics and Gynecology

## 2017-02-05 DIAGNOSIS — O26892 Other specified pregnancy related conditions, second trimester: Secondary | ICD-10-CM | POA: Insufficient documentation

## 2017-02-05 DIAGNOSIS — Z3A24 24 weeks gestation of pregnancy: Secondary | ICD-10-CM | POA: Insufficient documentation

## 2017-02-05 DIAGNOSIS — R109 Unspecified abdominal pain: Secondary | ICD-10-CM | POA: Insufficient documentation

## 2017-02-05 LAB — URINALYSIS, ROUTINE W REFLEX MICROSCOPIC
BILIRUBIN URINE: NEGATIVE
Glucose, UA: 150 mg/dL — AB
Hgb urine dipstick: NEGATIVE
Ketones, ur: NEGATIVE mg/dL
Leukocytes, UA: NEGATIVE
NITRITE: NEGATIVE
Protein, ur: NEGATIVE mg/dL
RBC / HPF: NONE SEEN RBC/hpf (ref 0–5)
SPECIFIC GRAVITY, URINE: 1.014 (ref 1.005–1.030)
pH: 8 (ref 5.0–8.0)

## 2017-02-05 MED ORDER — ACETAMINOPHEN 500 MG PO TABS
1000.0000 mg | ORAL_TABLET | Freq: Once | ORAL | Status: AC
  Administered 2017-02-05: 1000 mg via ORAL
  Filled 2017-02-05: qty 2

## 2017-02-05 NOTE — Discharge Instructions (Signed)

## 2017-02-05 NOTE — MAU Note (Signed)
PT SAYS SHE HAS CONSTANT ABD PAIN - STARTED YESTERDAY . [place].- ALL OK.

## 2017-02-05 NOTE — MAU Provider Note (Signed)
History     CSN: [id number]  Arrival date and time: 02/05/17 2140   First Provider Initiated Contact with Patient 02/05/17 2231      Chief Complaint  Patient presents with  . Abdominal Pain   HPI [name] is a 21 y.o. G2P0010 at [redacted]w[redacted]d who presents to MAU today with complaint of abdominal pain that has been constant since yesterday. She feels it is slightly worse today. She has not taken anything for pain. She states pain is mild to moderate. She denies vaginal bleeding, LOF or contractions. She reports normal FM.   OB History    Gravida Para Term Preterm AB Living   2       1     SAB TAB Ectopic Multiple Live Births   1              Past Medical History:  Diagnosis Date  . Fibroids     History reviewed. No pertinent surgical history.  Family History  Problem Relation Age of Onset  . Cancer Father   . Varicose Veins Father   . Diabetes Paternal Grandmother   . Hyperlipidemia Paternal Grandmother     Social History  Substance Use Topics  . Smoking status: Never Smoker  . Smokeless tobacco: Never Used  . Alcohol use No    Allergies: No Known Allergies  No prescriptions prior to admission.    Review of Systems  Constitutional: Negative for fever.  Gastrointestinal: Positive for abdominal pain. Negative for constipation, diarrhea, nausea and vomiting.  Genitourinary: Negative for dysuria, frequency, urgency, vaginal bleeding and vaginal discharge.   Physical Exam   Blood pressure 123/81, pulse 88, temperature 98.3 F (36.8 C), temperature source Oral, resp. rate 17, height 5\' 5"  (1.651 m), weight 162 lb 12 oz (73.8 kg), last menstrual period 08/18/2016, unknown if currently breastfeeding.  Physical Exam  Nursing note and vitals reviewed. Constitutional: She is oriented to person, place, and time. She appears well-developed and well-nourished. No distress.  HENT:  Head: Normocephalic and atraumatic.  Cardiovascular: Normal rate.    Respiratory: Effort normal.  GI: Soft. She exhibits no distension and no mass. There is no tenderness. There is no rebound and no guarding.  Neurological: She is alert and oriented to person, place, and time.  Skin: Skin is warm and dry. No erythema.  Psychiatric: She has a normal mood and affect.  Dilation: Closed Effacement (%): Thick Cervical Position: Middle Exam by:: Kerry Hough, PA-C   Results for orders placed or performed during the hospital encounter of 02/05/17 (from the past 24 hour(s))  Urinalysis, Routine w reflex microscopic     Status: Abnormal   Collection Time: 02/05/17 10:03 PM  Result Value Ref Range   Color, Urine YELLOW YELLOW   APPearance CLEAR CLEAR   Specific Gravity, Urine 1.014 1.005 - 1.030   pH 8.0 5.0 - 8.0   Glucose, UA 150 (A) NEGATIVE mg/dL   Hgb urine dipstick NEGATIVE NEGATIVE   Bilirubin Urine NEGATIVE NEGATIVE   Ketones, ur NEGATIVE NEGATIVE mg/dL   Protein, ur NEGATIVE NEGATIVE mg/dL   Nitrite NEGATIVE NEGATIVE   Leukocytes, UA NEGATIVE NEGATIVE   RBC / HPF NONE SEEN 0 - 5 RBC/hpf   WBC, UA 0-5 0 - 5 WBC/hpf   Bacteria, UA MANY (A) NONE SEEN   Squamous Epithelial / LPF 6-30 (A) NONE SEEN    Fetal Monitoring: Baseline: 140 bpm Variability: moderate Accelerations: 10 x 10 Decelerations: none Contractions: none  MAU Course  Procedures None  MDM UA today  Tylenol 1 G given for pain   Assessment and Plan  A: SIUP at 104w3d Abdominal pain in pregnancy, second trimester  P: Discharge home Tylenol PRN for pain Abdominal binder and warm bath or pool for abdominal pain Preterm labor precautions discussed Patient advised to follow-up with [facility] as scheduled for routine prenatal care Patient may return to MAU as needed or if her condition were to change or worsen   Kerry Hough, PA-C 02/06/2017, 1:22 AM

## 2017-02-13 ENCOUNTER — Ambulatory Visit (HOSPITAL_COMMUNITY)
Admission: RE | Admit: 2017-02-13 | Discharge: 2017-02-13 | Disposition: A | Discharge status: Home / Self Care | Payer: Self-pay | Source: Ambulatory Visit | Attending: Obstetrics and Gynecology | Admitting: Obstetrics and Gynecology

## 2017-02-13 DIAGNOSIS — O3410 Maternal care for benign tumor of corpus uteri, unspecified trimester: Secondary | ICD-10-CM

## 2017-02-13 DIAGNOSIS — O0992 Supervision of high risk pregnancy, unspecified, second trimester: Secondary | ICD-10-CM

## 2017-02-13 DIAGNOSIS — O3412 Maternal care for benign tumor of corpus uteri, second trimester: Secondary | ICD-10-CM | POA: Insufficient documentation

## 2017-02-13 DIAGNOSIS — Z362 Encounter for other antenatal screening follow-up: Secondary | ICD-10-CM | POA: Insufficient documentation

## 2017-02-13 DIAGNOSIS — D259 Leiomyoma of uterus, unspecified: Secondary | ICD-10-CM | POA: Insufficient documentation

## 2017-02-13 DIAGNOSIS — Z3A25 25 weeks gestation of pregnancy: Secondary | ICD-10-CM | POA: Insufficient documentation

## 2017-03-03 ENCOUNTER — Ambulatory Visit (INDEPENDENT_AMBULATORY_CARE_PROVIDER_SITE_OTHER): Payer: Self-pay | Admitting: Obstetrics & Gynecology

## 2017-03-03 VITALS — BP 98/65 | HR 94 | Wt 160.0 lb

## 2017-03-03 DIAGNOSIS — Z23 Encounter for immunization: Secondary | ICD-10-CM

## 2017-03-03 DIAGNOSIS — Z3483 Encounter for supervision of other normal pregnancy, third trimester: Secondary | ICD-10-CM

## 2017-03-03 MED ORDER — ONDANSETRON 4 MG PO TBDP: 4.0000 mg | ORAL_TABLET | Freq: Four times a day (QID) | ORAL | 3 refills | Status: DC | PRN

## 2017-03-03 NOTE — Progress Notes (Signed)
   PRENATAL VISIT NOTE  Subjective:  Lisa Liu is a 21 y.o. G2P0010 at [redacted]w[redacted]d being seen today for ongoing prenatal care.  She is currently monitored for the following issues for this low-risk pregnancy and has Fibroid uterus; Uterine fibroids affecting pregnancy, antepartum; Nausea and vomiting of pregnancy, antepartum; and Supervision of normal pregnancy in third trimester on her problem list.  Patient reports no complaints.  Contractions: Not present. Vag. Bleeding: None.  Movement: Present. Denies leaking of fluid.   The following portions of the patient's history were reviewed and updated as appropriate: allergies, current medications, past family history, past medical history, past social history, past surgical history and problem list. Problem list updated.  Objective:   Vitals:   03/03/17 0858  BP: 98/65  Pulse: 94  Weight: 160 lb (72.6 kg)    Fetal Status: Fetal Heart Rate (bpm): 137 Fundal Height: 30 cm Movement: Present     General:  Alert, oriented and cooperative. Patient is in no acute distress.  Skin: Skin is warm and dry. No rash noted.   Cardiovascular: Normal heart rate noted  Respiratory: Normal respiratory effort, no problems with respiration noted  Abdomen: Soft, gravid, appropriate for gestational age.  Pain/Pressure: Present     Pelvic: Cervical exam deferred        Extremities: Normal range of motion.  Edema: None  Mental Status:  Normal mood and affect. Normal behavior. Normal judgment and thought content.   Assessment and Plan:  Pregnancy: G2P0010 at [redacted]w[redacted]d  1. Encounter for supervision of other normal pregnancy in third trimester Third trimester labs, Tdap  - Glucose Tolerance, 2 Hours w/1 Hour - CBC - RPR - HIV antibody - Tdap vaccine greater than or equal to 7yo IM Preterm labor symptoms and general obstetric precautions including but not limited to vaginal bleeding, contractions, leaking of fluid and fetal movement were reviewed in  detail with the patient. Please refer to After Visit Summary for other counseling recommendations.  Return in about 4 weeks (around 03/31/2017) for OB 32 week visit ([facility]).    Verita Schneiders, MD

## 2017-03-03 NOTE — Addendum Note (Signed)
Addended by: Amado Coe on: 03/03/2017 10:07 AM   Modules accepted: Orders

## 2017-03-03 NOTE — Patient Instructions (Addendum)
Return to clinic for any scheduled appointments or obstetric concerns, or go to MAU for evaluation  [facility] [place] [address], [address] [place], [place]  [address] Phone - [phone]   Fax - [phone]  [facility] [address] [place], [place] [address] Phone - [phone]   Fax - [place] [address] [place], [place]  [address] Phone - [phone]   Fax - [phone]  [place]. [address], [place] [address] [place], [place]  [address] Phone - [phone]   Fax - [phone]  [place] [address] [place], [place]  [address] Phone - [phone]   Fax - [phone]  [facility] [address], [address] [place], [place]  [address] Phone - [phone]   Fax - [place] [address] [place], [place]  [address] Phone - [phone]   Fax - [phone]  [place] [address] [place], [place] [address] [place], [place]  [address] Phone - [phone]   Fax - [place] [address] [place], [place]  [address] Phone - [phone]   Fax - [phone] [facility] [place]. [address] [place], [place]  [address] Phone - [phone]   Fax - [phone]  [place] Naylor [address] [place], [place]  [address] Phone - [phone]   Fax - [phone]  [facility] AT [place], [place], [place]  [address] Phone - [phone]   Fax - [place] [address], [place], [place]  [address] Phone - [phone]   Fax - [phone]  [facility] [place] [address], [place], [place]  [address] Phone - [place], [place]  [address] Phone - [phone]   Fax - [place] [address] [place], [place]  [address] Phone - [phone]   Fax - [phone]  [place] [address] [place], [place]  [address] Phone - [phone]   Fax - [place]. [place], [place]  [address] Phone - [phone]   Fax - [place], [place], [place]  [address] Phone - [phone]   Fax - [place] [address] [place], [place]  [address] Phone - [phone]   Fax - [phone]  DAVID RUBIN [address], [place], [place]  [address] Phone - [phone]   Fax - [place] [place], [place]  [address] Phone - [phone]   Fax - [phone]  [place] [address] [place], [place]  [address] Phone - [phone]   Fax - [phone] Arnaldo [place] [address] [place], [place]  [address] Phone - [phone]   Fax - [place] [address] [place],   [address] Phone - [phone]   Fax - [place] [address], [place],   [address] Phone - [phone]   Fax - [phone]  [place] MD [address] [place] [address] Phone [phone]  Fax  [phone]   Third Trimester of Pregnancy The third trimester is from week 28 through week 40 (months 7 through 9). The third trimester is a time when the unborn baby (fetus) is growing rapidly. At the end of the ninth month, the fetus is about 20 inches in length and weighs 6-10 pounds. Body changes during your third trimester Your body will continue to go through many changes during  pregnancy. The changes vary from woman to woman. During the third trimester:  Your weight will continue to increase. You can expect to gain 25-35 pounds (11-16 kg) by the end of the pregnancy.  You may begin to get stretch marks on your hips, abdomen, and breasts.  You may urinate more often because the fetus is moving lower into your pelvis and pressing on your bladder.  You may develop or continue to have heartburn. This is caused by increased hormones that slow down muscles in the digestive tract.  You may develop or continue to have constipation because increased hormones slow digestion and cause the muscles that push waste through your intestines to relax.  You may develop hemorrhoids. These are swollen veins (varicose veins) in the rectum that can itch or be painful.  You may develop swollen, bulging veins (varicose veins) in your legs.  You may have increased body aches in the pelvis, back, or thighs. This is due to weight gain and increased hormones that are relaxing your joints.  You may have changes in your hair. These can include thickening of your hair, rapid growth, and changes in texture. Some women also have hair loss during or after pregnancy, or hair that feels dry or thin. Your hair will most likely return to normal after your baby is born.  Your breasts will continue to grow and they will continue to become tender. A yellow fluid (colostrum) may leak from your breasts. This is the first milk you are producing for your baby.  Your belly button may stick out.  You may notice more swelling in your hands, face, or ankles.  You may have increased tingling or numbness in your hands, arms, and legs. The skin on your belly may also feel numb.  You may feel short of breath because of your expanding uterus.  You may have more problems sleeping. This can be caused by the size of your belly, increased need to urinate, and an increase in your body's metabolism.  You may notice  the fetus "dropping," or moving lower in your abdomen (lightening).  You may have increased vaginal discharge.  You may notice your joints feel loose and you may have pain around your pelvic bone.  What to expect at prenatal visits You will have prenatal exams every 2 weeks until week 36. Then you will have weekly prenatal exams. During a routine prenatal visit:  You will be weighed to make sure you and the baby are growing normally.  Your blood pressure will be taken.  Your abdomen will be measured to track your baby's growth.  The fetal heartbeat will be listened to.  Any test results from the previous visit will be discussed.  You may have a cervical check near your due date to see if your cervix has softened or thinned (effaced).  You will be tested for Group B streptococcus. This happens between 35 and 37 weeks.  Your health care provider may ask you:  What your birth plan is.  How you are feeling.  If you are feeling the baby move.  If you have had any abnormal symptoms, such as leaking fluid, bleeding, severe headaches, or abdominal cramping.  If you are using any tobacco products, including cigarettes, chewing tobacco, and electronic cigarettes.  If you have any questions.  Other tests or screenings that may be performed during your third trimester include:  Blood tests that check for low iron levels (anemia).  Fetal testing to check the health, activity level, and growth of the fetus. Testing is done if you have certain medical conditions or if there are problems during the pregnancy.  Nonstress test (NST). This test checks the health of your baby to make sure there are no signs of problems, such as the baby not getting enough oxygen. During this test, a belt is placed around your belly. The baby is made to move, and its heart rate is monitored during movement.  What is false labor? False labor is a condition in which you feel small, irregular tightenings of the  muscles in the womb (contractions) that usually go away with rest, changing position, or drinking water. These are called Braxton Hicks contractions. Contractions may last for hours, days, or even weeks before true labor sets in. If contractions come at regular intervals, become more frequent, increase in intensity, or become painful, you should see your health care provider. What are the signs of labor?  Abdominal cramps.  Regular contractions that start at 10 minutes apart and become stronger and more frequent with time.  Contractions that start on the top of the uterus and spread down to the lower abdomen and back.  Increased pelvic pressure and dull back pain.  A watery or bloody mucus discharge that comes from the vagina.  Leaking of amniotic fluid. This is also known as your "water breaking." It could be a slow trickle or a gush. Let your health care provider know if it has a color or strange odor. If you have any of these signs, call your health care provider right away, even if it is before your due date. Follow these instructions at home: Medicines  Follow your health care provider's instructions regarding medicine use. Specific medicines may be either safe or unsafe to take during pregnancy.  Take a prenatal vitamin that contains at least 600 micrograms (mcg) of folic acid.  If you develop constipation, try taking a stool softener if your health care provider approves. Eating and drinking  Eat a balanced diet that includes fresh fruits and vegetables, whole grains, good sources of protein such as meat, eggs, or tofu, and low-fat dairy. Your health care provider will help you determine the amount of weight gain that is right for you.  Avoid raw meat and uncooked cheese. These carry germs that can cause birth defects in the baby.  If you have low calcium intake from food, talk to your health care provider about whether you should take a daily calcium supplement.  Eat four or  five small meals rather than three large meals a day.  Limit foods that are high in fat and processed sugars, such as fried and sweet foods.  To prevent constipation: ? Drink enough fluid to keep your urine clear or pale yellow. ? Eat foods that are high in fiber, such as fresh fruits and vegetables, whole grains, and beans. Activity  Exercise only as directed by your health care provider. Most women can continue their usual exercise routine during pregnancy. Try to exercise for 30 minutes at least 5 days a week. Stop exercising if you experience uterine  contractions.  Avoid heavy lifting.  Do not exercise in extreme heat or humidity, or at high altitudes.  Wear low-heel, comfortable shoes.  Practice good posture.  You may continue to have sex unless your health care provider tells you otherwise. Relieving pain and discomfort  Take frequent breaks and rest with your legs elevated if you have leg cramps or low back pain.  Take warm sitz baths to soothe any pain or discomfort caused by hemorrhoids. Use hemorrhoid cream if your health care provider approves.  Wear a good support bra to prevent discomfort from breast tenderness.  If you develop varicose veins: ? Wear support pantyhose or compression stockings as told by your healthcare provider. ? Elevate your feet for 15 minutes, 3-4 times a day. Prenatal care  Write down your questions. Take them to your prenatal visits.  Keep all your prenatal visits as told by your health care provider. This is important. Safety  Wear your seat belt at all times when driving.  Make a list of emergency phone numbers, including numbers for family, friends, the hospital, and police and fire departments. General instructions  Avoid cat litter boxes and soil used by cats. These carry germs that can cause birth defects in the baby. If you have a cat, ask someone to clean the litter box for you.  Do not travel far distances unless it is  absolutely necessary and only with the approval of your health care provider.  Do not use hot tubs, steam rooms, or saunas.  Do not drink alcohol.  Do not use any products that contain nicotine or tobacco, such as cigarettes and e-cigarettes. If you need help quitting, ask your health care provider.  Do not use any medicinal herbs or unprescribed drugs. These chemicals affect the formation and growth of the baby.  Do not douche or use tampons or scented sanitary pads.  Do not cross your legs for long periods of time.  To prepare for the arrival of your baby: ? Take prenatal classes to understand, practice, and ask questions about labor and delivery. ? Make a trial run to the hospital. ? Visit the hospital and tour the maternity area. ? Arrange for maternity or paternity leave through employers. ? Arrange for family and friends to take care of pets while you are in the hospital. ? Purchase a rear-facing car seat and make sure you know how to install it in your car. ? Pack your hospital bag. ? Prepare the baby's nursery. Make sure to remove all pillows and stuffed animals from the baby's crib to prevent suffocation.  Visit your dentist if you have not gone during your pregnancy. Use a soft toothbrush to brush your teeth and be gentle when you floss. Contact a health care provider if:  You are unsure if you are in labor or if your water has broken.  You become dizzy.  You have mild pelvic cramps, pelvic pressure, or nagging pain in your abdominal area.  You have lower back pain.  You have persistent nausea, vomiting, or diarrhea.  You have an unusual or bad smelling vaginal discharge.  You have pain when you urinate. Get help right away if:  Your water breaks before 37 weeks.  You have regular contractions less than 5 minutes apart before 37 weeks.  You have a fever.  You are leaking fluid from your vagina.  You have spotting or bleeding from your vagina.  You have  severe abdominal pain or cramping.  You have rapid weight  loss or weight gain.  You have shortness of breath with chest pain.  You notice sudden or extreme swelling of your face, hands, ankles, feet, or legs.  Your baby makes fewer than 10 movements in 2 hours.  You have severe headaches that do not go away when you take medicine.  You have vision changes. Summary  The third trimester is from week 28 through week 40, months 7 through 9. The third trimester is a time when the unborn baby (fetus) is growing rapidly.  During the third trimester, your discomfort may increase as you and your baby continue to gain weight. You may have abdominal, leg, and back pain, sleeping problems, and an increased need to urinate.  During the third trimester your breasts will keep growing and they will continue to become tender. A yellow fluid (colostrum) may leak from your breasts. This is the first milk you are producing for your baby.  False labor is a condition in which you feel small, irregular tightenings of the muscles in the womb (contractions) that eventually go away. These are called Braxton Hicks contractions. Contractions may last for hours, days, or even weeks before true labor sets in.  Signs of labor can include: abdominal cramps; regular contractions that start at 10 minutes apart and become stronger and more frequent with time; watery or bloody mucus discharge that comes from the vagina; increased pelvic pressure and dull back pain; and leaking of amniotic fluid. This information is not intended to replace advice given to you by your health care provider. Make sure you discuss any questions you have with your health care provider. Document Released: 07/15/2001 Document Revised: 12/27/2015 Document Reviewed: 09/21/2012 Elsevier Interactive Patient Education  2017 [employer].

## 2017-03-04 LAB — GLUCOSE TOLERANCE, 2 HOURS W/ 1HR
Glucose, 1 hour: 101 mg/dL (ref 65–179)
Glucose, 2 hour: 66 mg/dL (ref 65–152)
Glucose, Fasting: 69 mg/dL (ref 65–91)

## 2017-03-04 LAB — CBC
Hematocrit: 37 % (ref 34.0–46.6)
Hemoglobin: 12.4 g/dL (ref 11.1–15.9)
MCH: 30.1 pg (ref 26.6–33.0)
MCHC: 33.5 g/dL (ref 31.5–35.7)
MCV: 90 fL (ref 79–97)
Platelets: 247 10*3/uL (ref 150–379)
RBC: 4.12 x10E6/uL (ref 3.77–5.28)
RDW: 14.6 % (ref 12.3–15.4)
WBC: 5 10*3/uL (ref 3.4–10.8)

## 2017-03-04 LAB — RPR: RPR: NONREACTIVE

## 2017-03-04 LAB — HIV ANTIBODY (ROUTINE TESTING W REFLEX): HIV Screen 4th Generation wRfx: NONREACTIVE

## 2017-03-24 ENCOUNTER — Encounter (HOSPITAL_COMMUNITY): Payer: Self-pay

## 2017-03-24 ENCOUNTER — Inpatient Hospital Stay (HOSPITAL_COMMUNITY)
Admission: AD | Admit: 2017-03-24 | Discharge: 2017-03-24 | Disposition: A | Discharge status: Home / Self Care | Payer: Self-pay | Source: Ambulatory Visit | Attending: Family Medicine | Admitting: Family Medicine

## 2017-03-24 DIAGNOSIS — Z3A31 31 weeks gestation of pregnancy: Secondary | ICD-10-CM | POA: Insufficient documentation

## 2017-03-24 DIAGNOSIS — O99513 Diseases of the respiratory system complicating pregnancy, third trimester: Secondary | ICD-10-CM | POA: Insufficient documentation

## 2017-03-24 DIAGNOSIS — Z3689 Encounter for other specified antenatal screening: Secondary | ICD-10-CM

## 2017-03-24 DIAGNOSIS — J069 Acute upper respiratory infection, unspecified: Secondary | ICD-10-CM | POA: Insufficient documentation

## 2017-03-24 DIAGNOSIS — O9989 Other specified diseases and conditions complicating pregnancy, childbirth and the puerperium: Secondary | ICD-10-CM

## 2017-03-24 LAB — URINALYSIS, ROUTINE W REFLEX MICROSCOPIC
GLUCOSE, UA: 50 mg/dL — AB
HGB URINE DIPSTICK: NEGATIVE
Ketones, ur: NEGATIVE mg/dL
NITRITE: NEGATIVE
PH: 8 (ref 5.0–8.0)
Protein, ur: >=300 mg/dL — AB
SPECIFIC GRAVITY, URINE: 1.038 — AB (ref 1.005–1.030)

## 2017-03-24 LAB — INFLUENZA PANEL BY PCR (TYPE A & B)
INFLAPCR: NEGATIVE
Influenza B By PCR: NEGATIVE

## 2017-03-24 NOTE — Progress Notes (Addendum)
G2P0 @ 31.[redacted]wksga presents to triage for cold symptoms sore throat and coughing and feverish. Denies LOF or bleeding. +FM. EFM applied. VSS see flow sheet for details.   Provider at bs assessing pt.   2114: Flu swab and strep done.   2216: Discharge instructions given with pt understanding. Pt left unit via ambulatory

## 2017-03-24 NOTE — MAU Note (Signed)
Pt here with c/o cold (cough, congestion, runny and stuffy nose, sneezing, headache, fatigue). Denies any bleeding or leaking of fluid. Denies contractions. Reports good fetal movement.

## 2017-03-24 NOTE — Discharge Instructions (Signed)
Upper Respiratory Infection, Adult Most upper respiratory infections (URIs) are caused by a virus. A URI affects the nose, throat, and upper air passages. The most common type of URI is often called "the common cold." Follow these instructions at home:  Take medicines only as told by your doctor.  Gargle warm saltwater or take cough drops to comfort your throat as told by your doctor.  Use a warm mist humidifier or inhale steam from a shower to increase air moisture. This may make it easier to breathe.  Drink enough fluid to keep your pee (urine) clear or pale yellow.  Eat soups and other clear broths.  Have a healthy diet.  Rest as needed.  Go back to work when your fever is gone or your doctor says it is okay. ? You may need to stay home longer to avoid giving your URI to others. ? You can also wear a face mask and wash your hands often to prevent spread of the virus.  Use your inhaler more if you have asthma.  Do not use any tobacco products, including cigarettes, chewing tobacco, or electronic cigarettes. If you need help quitting, ask your doctor. Contact a doctor if:  You are getting worse, not better.  Your symptoms are not helped by medicine.  You have chills.  You are getting more short of breath.  You have brown or red mucus.  You have yellow or brown discharge from your nose.  You have pain in your face, especially when you bend forward.  You have a fever.  You have puffy (swollen) neck glands.  You have pain while swallowing.  You have white areas in the back of your throat. Get help right away if:  You have very bad or constant: ? Headache. ? Ear pain. ? Pain in your forehead, behind your eyes, and over your cheekbones (sinus pain). ? Chest pain.  You have long-lasting (chronic) lung disease and any of the following: ? Wheezing. ? Long-lasting cough. ? Coughing up blood. ? A change in your usual mucus.  You have a stiff neck.  You have  changes in your: ? Vision. ? Hearing. ? Thinking. ? Mood. This information is not intended to replace advice given to you by your health care provider. Make sure you discuss any questions you have with your health care provider. Document Released: 01/07/2008 Document Revised: 03/23/2016 Document Reviewed: 10/26/2013 Elsevier Interactive Patient Education  2018 [employer] Inc[employer]  

## 2017-03-24 NOTE — MAU Provider Note (Signed)
History     CSN: [id number]  Arrival date and time: 03/24/17 2033   First Provider Initiated Contact with Patient 03/24/17 2108      Chief Complaint  Patient presents with  . Nasal Congestion   G2P10010 @31 .1 wks here with nasal congestion, sore throat, and non-productive cough since yesterday. Denies fever or chils. Some mild body aches. Was around nephew who had similar sx. Reports good FM. No pregnancy complaints.   OB History    Gravida Para Term Preterm AB Living   2 0     1 0   SAB TAB Ectopic Multiple Live Births   1              Past Medical History:  Diagnosis Date  . Fibroids     No past surgical history on file.  Family History  Problem Relation Age of Onset  . Cancer Father   . Varicose Veins Father   . Diabetes Paternal Grandmother   . Hyperlipidemia Paternal Grandmother     Social History  Substance Use Topics  . Smoking status: Never Smoker  . Smokeless tobacco: Never Used  . Alcohol use No    Allergies: No Known Allergies  Prescriptions Prior to Admission  Medication Sig Dispense Refill Last Dose  . acetaminophen (TYLENOL) 325 MG tablet Take 325 mg by mouth every 6 (six) hours as needed for mild pain or headache.   Not Taking  . ondansetron (ZOFRAN ODT) 4 MG disintegrating tablet Take 1 tablet (4 mg total) by mouth every 6 (six) hours as needed for nausea or vomiting. 120 tablet 3   . Prenatal Vit-Fe Fumarate-FA (PRENATAL MULTIVITAMIN) TABS tablet Take 1 tablet by mouth at bedtime.   Taking  . promethazine (PHENERGAN) 25 MG tablet Take 0.5-1 tablets (12.5-25 mg total) by mouth every 6 (six) hours as needed. (Patient not taking: Reported on 03/03/2017) 30 tablet 2 Not Taking    Review of Systems  Constitutional: Negative for chills and fever.  HENT: Positive for congestion, sneezing and sore throat.   Respiratory: Positive for cough. Negative for shortness of breath.   Gastrointestinal: Negative for abdominal pain.  Genitourinary: Negative  for vaginal bleeding.   Physical Exam   Blood pressure 106/70, pulse (!) 107, temperature 98.3 F (36.8 C), temperature source Oral, resp. rate 18, height 5\' 5"  (1.651 m), weight 158 lb (71.7 kg), last menstrual period 08/18/2016, SpO2 99 %, unknown if currently breastfeeding.  Physical Exam  Constitutional: She is oriented to person, place, and time. She appears well-developed and well-nourished. No distress.  HENT:  Head: Normocephalic and atraumatic.  Right Ear: Tympanic membrane, external ear and ear canal normal.  Left Ear: Tympanic membrane, external ear and ear canal normal.  Nose: Nose normal.  Mouth/Throat: Uvula is midline, oropharynx is clear and moist and mucous membranes are normal. No oropharyngeal exudate, posterior oropharyngeal edema, posterior oropharyngeal erythema or tonsillar abscesses.  Neck: Normal range of motion.  Cardiovascular: Normal rate, regular rhythm and normal heart sounds.   Respiratory: Effort normal and breath sounds normal. No respiratory distress. She has no wheezes. She has no rales.  Musculoskeletal: Normal range of motion.  Neurological: She is alert and oriented to person, place, and time.  Skin: Skin is warm and dry.  Psychiatric: She has a normal mood and affect.  EFM: 140 bpm, mod variability, + accels, no decels Toco: none  Results for orders placed or performed during the hospital encounter of 03/24/17 (from the past 24 hour(s))  Urinalysis, Routine w reflex microscopic     Status: Abnormal   Collection Time: 03/24/17  8:40 PM  Result Value Ref Range   Color, Urine AMBER (A) YELLOW   APPearance HAZY (A) CLEAR   Specific Gravity, Urine 1.038 (H) 1.005 - 1.030   pH 8.0 5.0 - 8.0   Glucose, UA 50 (A) NEGATIVE mg/dL   Hgb urine dipstick NEGATIVE NEGATIVE   Bilirubin Urine MODERATE (A) NEGATIVE   Ketones, ur NEGATIVE NEGATIVE mg/dL   Protein, ur >=300 (A) NEGATIVE mg/dL   Nitrite NEGATIVE NEGATIVE   Leukocytes, UA TRACE (A) NEGATIVE    RBC / HPF 0-5 0 - 5 RBC/hpf   WBC, UA 0-5 0 - 5 WBC/hpf   Bacteria, UA RARE (A) NONE SEEN   Squamous Epithelial / LPF 0-5 (A) NONE SEEN   Mucus PRESENT   Influenza panel by PCR (type A & B)     Status: None   Collection Time: 03/24/17  9:16 PM  Result Value Ref Range   Influenza A By PCR NEGATIVE NEGATIVE   Influenza B By PCR NEGATIVE NEGATIVE    MAU Course  Procedures Rapid Influenza Rapid Strep  MDM Labs ordered and reviewed. No evidence of influenza or pneumonia. Likely common cold. Discussed supportive measures. Strep A pending. Stable for discharge home.  Assessment and Plan   1. [redacted] weeks gestation of pregnancy   2. Upper respiratory virus   3. NST (non-stress test) reactive    Discharge home Follow up in OB office as scheduled OTC cold meds-list provided Rest Hydrate  Allergies as of 03/24/2017   No Known Allergies     Medication List    STOP taking these medications   ondansetron 4 MG disintegrating tablet Commonly known as:  ZOFRAN ODT   promethazine 25 MG tablet Commonly known as:  PHENERGAN     TAKE these medications   acetaminophen 325 MG tablet Commonly known as:  TYLENOL Take 325 mg by mouth every 6 (six) hours as needed for mild pain or headache.   prenatal multivitamin Tabs tablet Take 1 tablet by mouth at bedtime.   ZANTAC PO Take 1 tablet by mouth 2 (two) times daily.      Julianne Handler, CNM 03/24/2017, 9:16 PM

## 2017-03-25 LAB — RAPID STREP SCREEN (MED CTR MEBANE ONLY): Streptococcus, Group A Screen (Direct): NEGATIVE

## 2017-03-26 ENCOUNTER — Encounter: Payer: Self-pay | Admitting: Obstetrics and Gynecology

## 2017-03-27 LAB — CULTURE, GROUP A STREP (THRC)

## 2017-04-01 ENCOUNTER — Ambulatory Visit (INDEPENDENT_AMBULATORY_CARE_PROVIDER_SITE_OTHER): Payer: Self-pay | Admitting: Student

## 2017-04-01 VITALS — BP 105/76 | HR 97 | Wt 155.0 lb

## 2017-04-01 DIAGNOSIS — O219 Vomiting of pregnancy, unspecified: Secondary | ICD-10-CM

## 2017-04-01 DIAGNOSIS — Z3483 Encounter for supervision of other normal pregnancy, third trimester: Secondary | ICD-10-CM

## 2017-04-01 DIAGNOSIS — O2613 Low weight gain in pregnancy, third trimester: Secondary | ICD-10-CM

## 2017-04-01 NOTE — Patient Instructions (Addendum)
-  try Protein shakes from [employer] (premier brand)  Nausea typically begins about 5 to 6 weeks and peaks about 11 weeks, resolving by 14 weeks for about half of women who experience it and by 22 weeks for 90 percent. Persistent or severe nausea beyond the first trimester should be further evaluated. One or all or any combination of these comfort measures can be tried to find the most effective relief for you: 1. Eat small, frequent meals, even as often as every 2 hours, because nausea is more common on an empty or overly full stomach. The more often you eat, the more chances you have to keep at least a little something down. Eat what sounds good to you and try cold foods if smells bother you. 2. Eat a protein snack at bedtime and keep protein snacks by the bed to eat each time you awaken during the night to keep blood sugar stable and help prevent morning nausea. 3. Eat dry crackers, potato chips, lemon drops, ginger cookies or toast before getting up in the morning. 4. Make sure each meal or snack contains a source of protein to keep blood sugar stable. 5. Do not brush your teeth immediately after getting up in the morning or right after eating to avoid stimulating the gag reflex at these susceptible times. 6. Drink carbonated beverages, especially ginger ale that contains real ginger (like [place] Dry). Try keeping an unopened can by the bed to drink warm before getting up in the morning. 7. Suck on lemon drops or sip lemonade throughout the day or when nauseated. 8. Avoid food with strong or offensive flavors or slimy and overly chewy textures. 9. Limit fat in your diet as it is hard to digest. 10. Try acupressure wrist bands, like Sea-Bands, at P6 acupressure point per package instructions - available at pharmacies, [facility], [employer], etc. 11. Rest! Nap daily or at least lie down whenever possible. 12. Stop prenatal vitamins until nausea resolves and just take folic acid 400mg  by mouth  daily. 13. Ginger capsules 250mg  by mouth 4 times a day or 8 ounces of ginger tea 4 times a day. 14. For mild nausea without vomiting, Vitamin B6 (pyridoxine) 25mg  by mouth 3 times a day. 15. For moderate nausea with mild vomiting (? 2 times a day) or if no relief from Vitamin B6 alone, ADD Unisom (doxylamine) 12.5mg  (1/2 tablet) by mouth at bedtime and continue Vitamin B6 (pyridoxine) 25mg  by mouth 3 times a day. 16. If no relief after 4-5 days, try Unisom (doxylamine) 25mg  by mouth at bedtime and 12.5mg  (1/2 tablet) in the morning and in the midafternoon PLUS Vitamin B6 (pyridoxine) 25mg  three times a day. May also increase Vitamin B6 to 50mg  at bedtime with 25mg  in morning and midafternoon if needed. 17. Unisom can cause drowsiness. Start with bedtime dose for 4-5 days first to decrease drowsiness then add as needed and as tolerated in morning and afternoon. NOTE: Bedtime dose helps with morning nausea, morning dose helps with afternoon nausea, and afternoon dose helps with evening nausea so adjust times for your particular needs.

## 2017-04-02 ENCOUNTER — Encounter: Payer: Self-pay | Admitting: Obstetrics and Gynecology

## 2017-04-02 DIAGNOSIS — O2610 Low weight gain in pregnancy, unspecified trimester: Secondary | ICD-10-CM | POA: Insufficient documentation

## 2017-04-02 HISTORY — DX: Low weight gain in pregnancy, unspecified trimester: O26.10

## 2017-04-02 NOTE — Progress Notes (Signed)
   PRENATAL VISIT NOTE  Subjective:  Lisa Liu is a 21 y.o. G2P0010 at [redacted]w[redacted]d being seen today for ongoing prenatal care.  She is currently monitored for the following issues for this low-risk pregnancy and has Fibroid uterus; Uterine fibroids affecting pregnancy, antepartum; Nausea and vomiting of pregnancy, antepartum; and Supervision of normal pregnancy in third trimester on her problem list.  Patient reports vomiting.  Contractions: Not present. Vag. Bleeding: None.  Movement: Present. Denies leaking of fluid.   The following portions of the patient's history were reviewed and updated as appropriate: allergies, current medications, past family history, past medical history, past social history, past surgical history and problem list. Problem list updated.  Objective:   Vitals:   04/01/17 1350  BP: 105/76  Pulse: 97  Weight: 155 lb (70.3 kg)    Fetal Status: Fetal Heart Rate (bpm): 141 Fundal Height: 32 cm Movement: Present     General:  Alert, oriented and cooperative. Patient is in no acute distress.  Skin: Skin is warm and dry. No rash noted.   Cardiovascular: Normal heart rate noted  Respiratory: Normal respiratory effort, no problems with respiration noted  Abdomen: Soft, gravid, appropriate for gestational age.  Pain/Pressure: Present     Pelvic: Cervical exam deferred        Extremities: Normal range of motion.  Edema: None  Mental Status:  Normal mood and affect. Normal behavior. Normal judgment and thought content.   Assessment and Plan:  Pregnancy: G2P0010 at [redacted]w[redacted]d  1. Encounter for supervision of other normal pregnancy in third trimester  - [place] MFM OB FOLLOW UP; Future  2. Nausea and vomiting of pregnancy, antepartum Patient's still throwing up after each meal, although she can keep some food down. Samples of bonjesta given, as well as B6 and unisom. Instructed patient on how to take. Patient will call back in 5 days if she is not feeling better and we  can switch her meds.   Preterm labor symptoms and general obstetric precautions including but not limited to vaginal bleeding, contractions, leaking of fluid and fetal movement were reviewed in detail with the patient. Please refer to After Visit Summary for other counseling recommendations.  Return in about 4 weeks (around 04/29/2017).   Starr Lake, CNM

## 2017-04-10 ENCOUNTER — Encounter (HOSPITAL_COMMUNITY): Payer: Self-pay | Admitting: *Deleted

## 2017-04-10 ENCOUNTER — Inpatient Hospital Stay (HOSPITAL_COMMUNITY)
Admission: AD | Admit: 2017-04-10 | Discharge: 2017-04-12 | DRG: 781 | Disposition: A | Discharge status: Home / Self Care | Payer: Self-pay | Source: Ambulatory Visit | Attending: Obstetrics and Gynecology | Admitting: Obstetrics and Gynecology

## 2017-04-10 DIAGNOSIS — K219 Gastro-esophageal reflux disease without esophagitis: Secondary | ICD-10-CM | POA: Diagnosis present

## 2017-04-10 DIAGNOSIS — D259 Leiomyoma of uterus, unspecified: Secondary | ICD-10-CM | POA: Diagnosis present

## 2017-04-10 DIAGNOSIS — O212 Late vomiting of pregnancy: Principal | ICD-10-CM | POA: Diagnosis present

## 2017-04-10 DIAGNOSIS — R740 Nonspecific elevation of levels of transaminase and lactic acid dehydrogenase [LDH]: Secondary | ICD-10-CM | POA: Diagnosis present

## 2017-04-10 DIAGNOSIS — O219 Vomiting of pregnancy, unspecified: Secondary | ICD-10-CM

## 2017-04-10 DIAGNOSIS — Z3A33 33 weeks gestation of pregnancy: Secondary | ICD-10-CM

## 2017-04-10 DIAGNOSIS — K7689 Other specified diseases of liver: Secondary | ICD-10-CM | POA: Diagnosis present

## 2017-04-10 DIAGNOSIS — O3413 Maternal care for benign tumor of corpus uteri, third trimester: Secondary | ICD-10-CM | POA: Diagnosis present

## 2017-04-10 DIAGNOSIS — E876 Hypokalemia: Secondary | ICD-10-CM | POA: Diagnosis present

## 2017-04-10 DIAGNOSIS — O99613 Diseases of the digestive system complicating pregnancy, third trimester: Secondary | ICD-10-CM | POA: Diagnosis present

## 2017-04-10 LAB — URINALYSIS, ROUTINE W REFLEX MICROSCOPIC
Glucose, UA: NEGATIVE mg/dL
Hgb urine dipstick: NEGATIVE
Ketones, ur: 80 mg/dL — AB
LEUKOCYTES UA: NEGATIVE
Nitrite: NEGATIVE
PH: 5 (ref 5.0–8.0)
Protein, ur: 30 mg/dL — AB
Specific Gravity, Urine: 1.023 (ref 1.005–1.030)

## 2017-04-10 MED ORDER — LACTATED RINGERS IV BOLUS (SEPSIS)
1000.0000 mL | Freq: Once | INTRAVENOUS | Status: AC
  Administered 2017-04-11: 1000 mL via INTRAVENOUS

## 2017-04-10 MED ORDER — ONDANSETRON HCL 4 MG/2ML IJ SOLN
4.0000 mg | Freq: Once | INTRAMUSCULAR | Status: AC
  Administered 2017-04-11: 4 mg via INTRAVENOUS
  Filled 2017-04-10: qty 2

## 2017-04-10 MED ORDER — PROMETHAZINE HCL 25 MG/ML IJ SOLN
25.0000 mg | Freq: Once | INTRAVENOUS | Status: DC
  Filled 2017-04-10: qty 1

## 2017-04-10 NOTE — MAU Provider Note (Signed)
History     CSN: [id number]  Arrival date and time: 04/10/17 2220   First Provider Initiated Contact with Patient 04/10/17 2337      Chief Complaint  Patient presents with  . Emesis   Lisa Liu is a 21 y.o. G2P0010 at [redacted]w[redacted]d who presents today with emesis. She states that this has been an ongoing issue for the entire pregnancy, but it is getting worse. She states that every time she eats she vomits. She has only been taking Diglegis, and it is not helping. She denies any contractions, VB or LOF. She reports normal fetal movement. She was 155 at her last appt and today she is 149.    Emesis   This is a new problem. The current episode started more than 1 month ago. Episode frequency: every time I eat. The problem has been gradually worsening. The emesis has an appearance of stomach contents. There has been no fever. Pertinent negatives include no chills, fever or headaches. Risk factors: pregnancy  Treatments tried: Diclegis.  The treatment provided no relief.   Past Medical History:  Diagnosis Date  . Fibroids     History reviewed. No pertinent surgical history.  Family History  Problem Relation Age of Onset  . Cancer Father   . Varicose Veins Father   . Diabetes Paternal Grandmother   . Hyperlipidemia Paternal Grandmother     Social History  Substance Use Topics  . Smoking status: Never Smoker  . Smokeless tobacco: Never Used  . Alcohol use No    Allergies: No Known Allergies  Prescriptions Prior to Admission  Medication Sig Dispense Refill Last Dose  . acetaminophen (TYLENOL) 325 MG tablet Take 325 mg by mouth every 6 (six) hours as needed for mild pain or headache.   Past Week at Unknown time  . Prenatal Vit-Fe Fumarate-FA (PRENATAL MULTIVITAMIN) TABS tablet Take 1 tablet by mouth at bedtime.   04/10/2017 at Unknown time  . RaNITidine HCl (ZANTAC PO) Take 1 tablet by mouth 2 (two) times daily.   04/10/2017 at Unknown time    Review of Systems   Constitutional: Positive for fatigue. Negative for chills and fever.  Gastrointestinal: Positive for nausea and vomiting.  Genitourinary: Negative for pelvic pain, vaginal bleeding and vaginal discharge.  Neurological: Positive for weakness. Negative for headaches.   Physical Exam   Blood pressure 102/69, pulse (!) 111, temperature 98.2 F (36.8 C), resp. rate 18, height 5\' 5"  (1.651 m), weight 149 lb (67.6 kg), last menstrual period 08/18/2016, SpO2 100 %, unknown if currently breastfeeding.  Physical Exam  Nursing note and vitals reviewed. Constitutional: She is oriented to person, place, and time. She appears well-developed and well-nourished. No distress.  HENT:  Head: Normocephalic.  Cardiovascular: Normal rate.   Respiratory: Effort normal.  GI: Soft. There is no tenderness. There is no rebound.  Neurological: She is alert and oriented to person, place, and time.  Skin: Skin is warm and dry.  Psychiatric: She has a normal mood and affect.    FHT: 130, moderate with 15x15 accels, no decels Toco: no UCs   Results for orders placed or performed during the hospital encounter of 04/10/17 (from the past 24 hour(s))  Urinalysis, Routine w reflex microscopic     Status: Abnormal   Collection Time: 04/10/17 10:45 PM  Result Value Ref Range   Color, Urine AMBER (A) YELLOW   APPearance HAZY (A) CLEAR   Specific Gravity, Urine 1.023 1.005 - 1.030   pH 5.0 5.0 -  8.0   Glucose, UA NEGATIVE NEGATIVE mg/dL   Hgb urine dipstick NEGATIVE NEGATIVE   Bilirubin Urine MODERATE (A) NEGATIVE   Ketones, ur 80 (A) NEGATIVE mg/dL   Protein, ur 30 (A) NEGATIVE mg/dL   Nitrite NEGATIVE NEGATIVE   Leukocytes, UA NEGATIVE NEGATIVE   RBC / HPF 0-5 0 - 5 RBC/hpf   WBC, UA 6-30 0 - 5 WBC/hpf   Bacteria, UA FEW (A) NONE SEEN   Squamous Epithelial / LPF 0-5 (A) NONE SEEN   Mucus PRESENT   CBC     Status: None   Collection Time: 04/11/17 12:35 AM  Result Value Ref Range   WBC 4.6 4.0 - 10.5 K/uL    RBC 4.48 3.87 - 5.11 MIL/uL   Hemoglobin 13.7 12.0 - 15.0 g/dL   HCT 39.0 36.0 - 46.0 %   MCV 87.1 78.0 - 100.0 fL   MCH 30.6 26.0 - 34.0 pg   MCHC 35.1 30.0 - 36.0 g/dL   RDW 13.4 11.5 - 15.5 %   Platelets 248 150 - 400 K/uL  Comprehensive metabolic panel     Status: Abnormal   Collection Time: 04/11/17 12:35 AM  Result Value Ref Range   Sodium 133 (L) 135 - 145 mmol/L   Potassium 2.9 (L) 3.5 - 5.1 mmol/L   Chloride 93 (L) 101 - 111 mmol/L   CO2 29 22 - 32 mmol/L   Glucose, Bld 99 65 - 99 mg/dL   BUN 9 6 - 20 mg/dL   Creatinine, Ser 0.75 0.44 - 1.00 mg/dL   Calcium 9.4 8.9 - 10.3 mg/dL   Total Protein 6.5 6.5 - 8.1 g/dL   Albumin 2.7 (L) 3.5 - 5.0 g/dL   AST 239 (H) 15 - 41 U/L   ALT 358 (H) 14 - 54 U/L   Alkaline Phosphatase 170 (H) 38 - 126 U/L   Total Bilirubin 1.9 (H) 0.3 - 1.2 mg/dL   GFR calc non Af Amer >60 >60 mL/min   GFR calc Af Amer >60 >60 mL/min   Anion gap 11 5 - 15  Amylase     Status: None   Collection Time: 04/11/17  2:05 AM  Result Value Ref Range   Amylase 71 28 - 100 U/L  Lipase, blood     Status: None   Collection Time: 04/11/17  2:05 AM  Result Value Ref Range   Lipase 20 11 - 51 U/L   US Abdomen Complete  Result Date: 04/11/2017 CLINICAL DATA:  Vomiting, weakness, elevated liver function studies. Thirty-three weeks pregnant. EXAM: ABDOMEN ULTRASOUND COMPLETE COMPARISON:  None. FINDINGS: Gallbladder: Gallbladder sludge is present. No stones. No gallbladder wall thickening or edema. Murphy's sign is negative. Common bile duct: Diameter: 3.5 mm, normal Liver: No focal lesion identified. Within normal limits in parenchymal echogenicity. Portal vein is patent on color Doppler imaging with normal direction of blood flow towards the liver. IVC: Limited visualization.  No abnormality visualized. Pancreas: Not visualized due to overlying bowel gas. Spleen: Size and appearance within normal limits. Right Kidney: Length: 10.5 cm. Mildly dilated intrarenal  collecting system demonstrated. This is likely due to compression from the pregnancy. Left Kidney: Length: 9.9 cm. Echogenicity within normal limits. No mass or hydronephrosis visualized. Abdominal aorta: No aneurysm visualized. Other findings: AP solid heterogeneous hypoechoic mass lesion is demonstrated in the right pelvis extending up to the liver edge. Pelvic structures are incompletely demonstrated during this abdominal examination but this likely represents a uterine fibroid. Multiple uterine fibroids were  noted on previous obstetrical ultrasound from 02/13/2017. IMPRESSION: 1. Gallbladder sludge consistent with stasis. No evidence of cholelithiasis or cholecystitis. 2. Mildly dilated intrarenal collecting system on the right likely due to compression from the pregnancy. 3. Heterogeneous solid mass demonstrated in the right pelvis consistent with uterine fibroid. Electronically Signed   By: Lucienne Capers M.D.   On: 04/11/2017 03:10    MAU Course  Procedures  MDM 0330: DW Dr. Rip Harbour, will admit to monitor liver function Hepatis panel, P:C pending  Assessment and Plan  Elevated liver enzymes Admit to 3rd floor Watch liver enzyme trends Hepatitis panel pending Pro:Cre ratio pending    Marcille Buffy 04/10/2017, 11:39 PM

## 2017-04-10 NOTE — MAU Note (Signed)
Vomiting entire pregnancy. Feeling really weak past couple days like may pass out. Feels bad all over

## 2017-04-11 ENCOUNTER — Encounter (HOSPITAL_COMMUNITY): Payer: Self-pay | Admitting: *Deleted

## 2017-04-11 ENCOUNTER — Inpatient Hospital Stay (HOSPITAL_COMMUNITY): Payer: Self-pay

## 2017-04-11 DIAGNOSIS — K7689 Other specified diseases of liver: Secondary | ICD-10-CM | POA: Diagnosis present

## 2017-04-11 LAB — CBC
HCT: 39 % (ref 36.0–46.0)
HEMATOCRIT: 36.7 % (ref 36.0–46.0)
HEMOGLOBIN: 12.9 g/dL (ref 12.0–15.0)
Hemoglobin: 13.7 g/dL (ref 12.0–15.0)
MCH: 30.6 pg (ref 26.0–34.0)
MCH: 31.1 pg (ref 26.0–34.0)
MCHC: 35.1 g/dL (ref 30.0–36.0)
MCHC: 35.1 g/dL (ref 30.0–36.0)
MCV: 87.1 fL (ref 78.0–100.0)
MCV: 88.4 fL (ref 78.0–100.0)
PLATELETS: 248 10*3/uL (ref 150–400)
Platelets: 217 10*3/uL (ref 150–400)
RBC: 4.15 MIL/uL (ref 3.87–5.11)
RBC: 4.48 MIL/uL (ref 3.87–5.11)
RDW: 13.4 % (ref 11.5–15.5)
RDW: 13.5 % (ref 11.5–15.5)
WBC: 4.2 10*3/uL (ref 4.0–10.5)
WBC: 4.6 10*3/uL (ref 4.0–10.5)

## 2017-04-11 LAB — COMPREHENSIVE METABOLIC PANEL
ALK PHOS: 155 U/L — AB (ref 38–126)
ALT: 301 U/L — ABNORMAL HIGH (ref 14–54)
ALT: 358 U/L — AB (ref 14–54)
ANION GAP: 9 (ref 5–15)
AST: 239 U/L — ABNORMAL HIGH (ref 15–41)
AST: 180 U/L — ABNORMAL HIGH (ref 15–41)
Albumin: 2.7 g/dL — ABNORMAL LOW (ref 3.5–5.0)
Albumin: 2.6 g/dL — ABNORMAL LOW (ref 3.5–5.0)
Alkaline Phosphatase: 170 U/L — ABNORMAL HIGH (ref 38–126)
Anion gap: 11 (ref 5–15)
BILIRUBIN TOTAL: 2 mg/dL — AB (ref 0.3–1.2)
BUN: 6 mg/dL (ref 6–20)
BUN: 9 mg/dL (ref 6–20)
CALCIUM: 9.1 mg/dL (ref 8.9–10.3)
CALCIUM: 9.4 mg/dL (ref 8.9–10.3)
CO2: 29 mmol/L (ref 22–32)
CO2: 30 mmol/L (ref 22–32)
CREATININE: 0.75 mg/dL (ref 0.44–1.00)
Chloride: 93 mmol/L — ABNORMAL LOW (ref 101–111)
Chloride: 97 mmol/L — ABNORMAL LOW (ref 101–111)
Creatinine, Ser: 0.73 mg/dL (ref 0.44–1.00)
GFR calc non Af Amer: >60 mL/min (ref >60)
GLUCOSE: 99 mg/dL (ref 65–99)
Glucose, Bld: 72 mg/dL (ref 65–99)
POTASSIUM: 2.9 mmol/L — AB (ref 3.5–5.1)
POTASSIUM: 3.1 mmol/L — AB (ref 3.5–5.1)
SODIUM: 136 mmol/L (ref 135–145)
Sodium: 133 mmol/L — ABNORMAL LOW (ref 135–145)
TOTAL PROTEIN: 6.3 g/dL — AB (ref 6.5–8.1)
Total Bilirubin: 1.9 mg/dL — ABNORMAL HIGH (ref 0.3–1.2)
Total Protein: 6.5 g/dL (ref 6.5–8.1)

## 2017-04-11 LAB — TYPE AND SCREEN
ABO/RH(D): A POS
Antibody Screen: NEGATIVE

## 2017-04-11 LAB — AMYLASE: Amylase: 71 U/L (ref 28–100)

## 2017-04-11 LAB — ACETAMINOPHEN LEVEL: Acetaminophen (Tylenol), Serum: <10 ug/mL — ABNORMAL LOW (ref 10–30)

## 2017-04-11 LAB — PROTEIN / CREATININE RATIO, URINE
Creatinine, Urine: 286 mg/dL
PROTEIN CREATININE RATIO: 0.09 mg/mg{creat} (ref 0.00–0.15)
TOTAL PROTEIN, URINE: 27 mg/dL

## 2017-04-11 LAB — ABO/RH: ABO/RH(D): A POS

## 2017-04-11 LAB — LIPASE, BLOOD: Lipase: 20 U/L (ref 11–51)

## 2017-04-11 MED ORDER — ACETAMINOPHEN 325 MG PO TABS: 650.0000 mg | ORAL_TABLET | ORAL | Status: DC | PRN

## 2017-04-11 MED ORDER — POTASSIUM CHLORIDE CRYS ER 20 MEQ PO TBCR
40.0000 meq | EXTENDED_RELEASE_TABLET | Freq: Once | ORAL | Status: AC
  Administered 2017-04-11: 40 meq via ORAL
  Filled 2017-04-11: qty 2

## 2017-04-11 MED ORDER — PRENATAL MULTIVITAMIN CH
1.0000 | ORAL_TABLET | Freq: Every day | ORAL | Status: DC
  Administered 2017-04-11 – 2017-04-12 (×2): 1 via ORAL
  Filled 2017-04-11 (×3): qty 1

## 2017-04-11 MED ORDER — LACTATED RINGERS IV SOLN
INTRAVENOUS | Status: DC
  Administered 2017-04-11 – 2017-04-12 (×4): via INTRAVENOUS

## 2017-04-11 MED ORDER — LACTATED RINGERS IV SOLN
INTRAVENOUS | Status: DC
  Administered 2017-04-11: 02:00:00 via INTRAVENOUS

## 2017-04-11 MED ORDER — CALCIUM CARBONATE ANTACID 500 MG PO CHEW
2.0000 | CHEWABLE_TABLET | ORAL | Status: DC | PRN
  Administered 2017-04-11: 400 mg via ORAL
  Filled 2017-04-11: qty 2

## 2017-04-11 MED ORDER — ZOLPIDEM TARTRATE 5 MG PO TABS: 5.0000 mg | ORAL_TABLET | Freq: Every evening | ORAL | Status: DC | PRN

## 2017-04-11 MED ORDER — DOCUSATE SODIUM 100 MG PO CAPS
100.0000 mg | ORAL_CAPSULE | Freq: Every day | ORAL | Status: DC
  Administered 2017-04-11: 100 mg via ORAL
  Filled 2017-04-11 (×2): qty 1

## 2017-04-11 MED ORDER — ONDANSETRON HCL 4 MG/2ML IJ SOLN: 4.0000 mg | Freq: Four times a day (QID) | INTRAMUSCULAR | Status: DC | PRN

## 2017-04-11 NOTE — H&P (Signed)
Lisa Liu is a 21 y.o. female G2P0010 IUP 33 5/7 weeks presenting for N/V. Pt has had problems with N/V throughout pregnancy. Has tried several medications which help at times.  N/V increased yesterday, 8 or more times, felt weak and tried. Denies any fever, chills or recent exposure.  No HA, visual changes, VB or LOF. + FM.  Prenatal care unremarkable except as noted above and uterine fibroids.  W/U in the MAU noted increased LFT's, gallbladder sludge, no evidence of PEC. OB History    Gravida Para Term Preterm AB Living   2 0     1 0   SAB TAB Ectopic Multiple Live Births   1             Past Medical History:  Diagnosis Date  . Fibroids    History reviewed. No pertinent surgical history. Family History: family history includes Cancer in her father; Diabetes in her paternal grandmother; Hyperlipidemia in her paternal grandmother; Varicose Veins in her father. Social History:  reports that she has never smoked. She has never used smokeless tobacco. She reports that she does not drink alcohol or use drugs.      Review of Systems  Constitutional: Positive for malaise/fatigue.  Eyes: Negative.   Respiratory: Negative.   Cardiovascular: Negative.   Gastrointestinal: Positive for nausea and vomiting.  Genitourinary: Negative.   Neurological: Positive for weakness.   Maternal Medical History:  Reason for admission: Nausea.       Blood pressure 112/64, pulse 80, temperature 97.9 F (36.6 C), temperature source Oral, resp. rate 17, height 5\' 5"  (1.651 m), weight 67.6 kg (149 lb), last menstrual period 08/18/2016, SpO2 99 %, unknown if currently breastfeeding. Exam Physical Exam  Constitutional: She appears well-developed and well-nourished.  Cardiovascular: Normal rate, regular rhythm and normal heart sounds.   Respiratory: Effort normal and breath sounds normal.  GI: Soft. Bowel sounds are normal. There is no tenderness.  Gravid     Prenatal labs: ABO, Rh:  --/--/A POS (09/08 0205) Antibody: NEG (09/08 0205) Rubella: >33.00 (04/06 0945) RPR: Non Reactive (07/31 0859)  HBsAg: Negative (04/06 0945)  HIV: NONREACTIVE (11/27 0951)  GBS:     Assessment/Plan: IUP 33 5/7 weeks Elevated LFT's N/V  Will admit to antenatal unit for hydration, antiemetics and additional labs.  Suspect increased LFT's related to N/V. No evidence of PEC.    Chancy Milroy 04/11/2017, 7:28 AM

## 2017-04-11 NOTE — Progress Notes (Signed)
Tums 400 mg administered for indigestion. We will continue to monitor.

## 2017-04-12 DIAGNOSIS — O212 Late vomiting of pregnancy: Principal | ICD-10-CM

## 2017-04-12 DIAGNOSIS — E876 Hypokalemia: Secondary | ICD-10-CM | POA: Diagnosis present

## 2017-04-12 DIAGNOSIS — Z3A36 36 weeks gestation of pregnancy: Secondary | ICD-10-CM

## 2017-04-12 LAB — COMPREHENSIVE METABOLIC PANEL
ALBUMIN: 2.5 g/dL — AB (ref 3.5–5.0)
ALK PHOS: 144 U/L — AB (ref 38–126)
ALT: 245 U/L — ABNORMAL HIGH (ref 14–54)
AST: 112 U/L — AB (ref 15–41)
Anion gap: 9 (ref 5–15)
BUN: 5 mg/dL — AB (ref 6–20)
CO2: 27 mmol/L (ref 22–32)
Calcium: 9 mg/dL (ref 8.9–10.3)
Chloride: 102 mmol/L (ref 101–111)
Creatinine, Ser: 0.77 mg/dL (ref 0.44–1.00)
GFR calc Af Amer: >60 mL/min (ref >60)
GFR calc non Af Amer: >60 mL/min (ref >60)
GLUCOSE: 72 mg/dL (ref 65–99)
POTASSIUM: 3.2 mmol/L — AB (ref 3.5–5.1)
Sodium: 138 mmol/L (ref 135–145)
Total Bilirubin: 1.7 mg/dL — ABNORMAL HIGH (ref 0.3–1.2)
Total Protein: 6.2 g/dL — ABNORMAL LOW (ref 6.5–8.1)

## 2017-04-12 LAB — CBC
HCT: 36.7 % (ref 36.0–46.0)
Hemoglobin: 12.7 g/dL (ref 12.0–15.0)
MCH: 30.7 pg (ref 26.0–34.0)
MCHC: 34.6 g/dL (ref 30.0–36.0)
MCV: 88.6 fL (ref 78.0–100.0)
Platelets: 205 10*3/uL (ref 150–400)
RBC: 4.14 MIL/uL (ref 3.87–5.11)
RDW: 13.5 % (ref 11.5–15.5)
WBC: 4 10*3/uL (ref 4.0–10.5)

## 2017-04-12 MED ORDER — POTASSIUM CHLORIDE CRYS ER 15 MEQ PO TBCR: 30.0000 meq | EXTENDED_RELEASE_TABLET | Freq: Two times a day (BID) | ORAL | 0 refills | Status: DC

## 2017-04-12 MED ORDER — ONDANSETRON HCL 4 MG PO TABS: 8.0000 mg | ORAL_TABLET | Freq: Three times a day (TID) | ORAL | Status: DC | PRN

## 2017-04-12 MED ORDER — POTASSIUM CHLORIDE CRYS ER 10 MEQ PO TBCR
30.0000 meq | EXTENDED_RELEASE_TABLET | Freq: Two times a day (BID) | ORAL | Status: DC
  Filled 2017-04-12 (×3): qty 1

## 2017-04-12 MED ORDER — DOCUSATE SODIUM 100 MG PO CAPS: 100.0000 mg | ORAL_CAPSULE | Freq: Two times a day (BID) | ORAL | Status: DC | PRN

## 2017-04-12 MED ORDER — ONDANSETRON HCL 8 MG PO TABS: 8.0000 mg | ORAL_TABLET | Freq: Three times a day (TID) | ORAL | 0 refills | Status: DC | PRN

## 2017-04-12 MED ORDER — DOCUSATE SODIUM 100 MG PO CAPS: 100.0000 mg | ORAL_CAPSULE | Freq: Two times a day (BID) | ORAL | 0 refills | Status: DC | PRN

## 2017-04-12 MED ORDER — FAMOTIDINE 20 MG PO TABS
20.0000 mg | ORAL_TABLET | Freq: Two times a day (BID) | ORAL | Status: DC
  Administered 2017-04-12: 20 mg via ORAL
  Filled 2017-04-12: qty 1

## 2017-04-12 MED ORDER — FAMOTIDINE 20 MG PO TABS: 20.0000 mg | ORAL_TABLET | Freq: Two times a day (BID) | ORAL | 1 refills | Status: DC

## 2017-04-12 NOTE — Progress Notes (Signed)
Patient is eating and drinking well. No C/O nausea or vomiting. Ambulating well, denies weakness.

## 2017-04-12 NOTE — Discharge Summary (Signed)
Discharge Summary   Admit Date: 04/10/2017 Discharge Date: 04/12/2017 Discharging Service: Antepartum  Primary [facility]YN: [facility] Admitting Physician: Chancy Milroy, MD  Discharge Physician: Ilda Basset  Referring Provider: MAU  Primary Care Provider: Sela Hua, MD  Admission Diagnoses: Pregnancy at 33/5 weeks Nausea and vomiting of pregnancy Elevated transaminases Hypokalemia  Discharge Diagnoses: Pregnancy at 33/6 weeks Improved nausea and vomiting of pregnancy Downtrending transaminases and improving hypokalemia  Consult Orders: None   Surgeries/Procedures Performed: None  History and Physical: Lisa Liu is a 21 y.o. female G2P0010 IUP 33 5/7 weeks presenting for N/V. Pt has had problems with N/V throughout pregnancy. Has tried several medications which help at times.  N/V increased yesterday, 8 or more times, felt weak and tried. Denies any fever, chills or recent exposure.  No HA, visual changes, VB or LOF. + FM.  Prenatal care unremarkable except as noted above and uterine fibroids.  W/U in the MAU noted increased LFT's, gallbladder sludge, no evidence of PEC.         OB History    Gravida Para Term Preterm AB Living   2 0     1 0   SAB TAB Ectopic Multiple Live Births   1                 Past Medical History:  Diagnosis Date  . Fibroids    History reviewed. No pertinent surgical history. Family History: family history includes Cancer in her father; Diabetes in her paternal grandmother; Hyperlipidemia in her paternal grandmother; Varicose Veins in her father. Social History:  reports that she has never smoked. She has never used smokeless tobacco. She reports that she does not drink alcohol or use drugs.      Review of Systems  Constitutional: Positive for malaise/fatigue.  Eyes: Negative.   Respiratory: Negative.   Cardiovascular: Negative.   Gastrointestinal: Positive for nausea and  vomiting.  Genitourinary: Negative.   Neurological: Positive for weakness.   Maternal Medical History:  Reason for admission: Nausea.    Blood pressure 112/64, pulse 80, temperature 97.9 F (36.6 C), temperature source Oral, resp. rate 17, height 5\' 5"  (1.651 m), weight 67.6 kg (149 lb), last menstrual period 08/18/2016, SpO2 99 %, unknown if currently breastfeeding. Exam Physical Exam  Constitutional: She appears well-developed and well-nourished.  Cardiovascular: Normal rate, regular rhythm and normal heart sounds.   Respiratory: Effort normal and breath sounds normal.  GI: Soft. Bowel sounds are normal. There is no tenderness.  Gravid     Prenatal labs: ABO, Rh: --/--/A POS (09/08 0205) Antibody: NEG (09/08 0205) Rubella: >33.00 (04/06 0945) RPR: Non Reactive (07/31 0859)  HBsAg: Negative (04/06 0945)  HIV: NONREACTIVE (11/27 0951)  GBS:     Assessment/Plan: IUP 33 5/7 weeks Elevated LFT's N/V  Will admit to antenatal unit for hydration, antiemetics and additional labs.  Suspect increased LFT's related to N/V. No evidence of PEC.           Chancy Milroy 04/11/2017, 7:28 AM   Hospital Course: *Pregnancy: reassuring AP testing with reactive NSTs *GI/FEN: improved with PO meds. Negative acute hep panel, apap level, and liver u/s with just sludge seen on GB u/s. Labs improving. D/c to home with PO PRNs. No e/o pre-eclampsia.  Recommend repeat labs at next prenatal visit.   Discharge Exam:  Vitals:   04/11/17 2050 04/12/17 0014 04/12/17 0805 04/12/17 1132  BP:  113/66 (!) 99/49 91/63  Pulse: 84 72 78 (!)  105  Resp: 18 16 16 16   Temp: 98.4 F (36.9 C) 97.7 F (36.5 C) 97.9 F (36.6 C) 98 F (36.7 C)  TempSrc: Oral Oral Oral Oral  SpO2: 98% 99% 100% 97%  Weight:      Height:         Current Vital Signs 24h Vital Sign Ranges  T 98 F (36.7 C) Temp  Avg: 98.1 F (36.7 C)  Min: 97.7 F (36.5 C)  Max: 98.5 F (36.9 C)  BP 91/63 BP  Min: 91/63  Max:  113/66  HR (!) 105 Pulse  Avg: 83.4  Min: 72  Max: 105  RR 16 Resp  Avg: 16.4  Min: 16  Max: 18  SaO2 97 % Not Delivered SpO2  Avg: 98.8 %  Min: 97 %  Max: 100 %       24 Hour I/O Current Shift I/O  Time Ins Outs 09/08 0701 - 09/09 0700 In: 3062.5 [P.O.:600; I.V.:2462.5] Out: -  No intake/output data recorded.   General appearance: Well nourished, well developed female in no acute distress.  Cardiovascular: S1, S2 normal, no murmur, rub or gallop, regular rate and rhythm Respiratory:  Clear to auscultation bilateral. Normal respiratory effort Abdomen: gravid, nttp Neuro/Psych:  Normal mood and affect.  Skin:  Warm and dry.   Discharge Disposition:  Home  Patient Instructions:  Standard   Results Pending at Discharge:  none  Discharge Medications: Allergies as of 04/12/2017   No Known Allergies     Medication List    STOP taking these medications   acetaminophen 325 MG tablet Commonly known as:  TYLENOL   ZANTAC PO     TAKE these medications   docusate sodium 100 MG capsule Commonly known as:  COLACE Take 1 capsule (100 mg total) by mouth 2 (two) times daily as needed for mild constipation.   famotidine 20 MG tablet Commonly known as:  PEPCID Take 1 tablet (20 mg total) by mouth 2 (two) times daily.   ondansetron 8 MG tablet Commonly known as:  ZOFRAN Take 1 tablet (8 mg total) by mouth every 8 (eight) hours as needed for nausea or vomiting.   potassium chloride SA 15 MEQ tablet Commonly known as:  KLOR-CON M15 Take 2 tablets (30 mEq total) by mouth 2 (two) times daily before a meal.   prenatal multivitamin Tabs tablet Take 1 tablet by mouth at bedtime.            Discharge Care Instructions        Start     Ordered   04/12/17 0000  docusate sodium (COLACE) 100 MG capsule  2 times daily PRN     04/12/17 1329   04/12/17 0000  famotidine (PEPCID) 20 MG tablet  2 times daily     04/12/17 1329   04/12/17 0000  ondansetron (ZOFRAN) 8 MG tablet   Every 8 hours PRN     04/12/17 1329   04/12/17 0000  potassium chloride (KLOR-CON M15) 15 MEQ tablet  2 times daily before meals     04/12/17 1329   04/12/17 0000  Discharge patient    Question Answer Comment  Discharge disposition 01-Home or Self Care   Discharge patient date 04/12/2017      04/12/17 1329       Future Appointments Date Time Provider [place]  04/28/2017 10:15 AM [facility] [place] 4 [facility]  04/29/2017 1:15 PM Constant, Vickii Chafe, MD [facility]    Durene Romans. MD  [facility]ending [facility])

## 2017-04-13 LAB — HEPATITIS PANEL, ACUTE
HEP B S AG: NEGATIVE
Hep A IgM: NEGATIVE
Hep B C IgM: NEGATIVE

## 2017-04-13 LAB — HEPATITIS C ANTIBODY: HCV Ab: <0.1 s/co ratio (ref 0.0–0.9)

## 2017-04-23 ENCOUNTER — Encounter (HOSPITAL_COMMUNITY): Payer: Self-pay

## 2017-04-23 ENCOUNTER — Inpatient Hospital Stay (HOSPITAL_COMMUNITY)
Admission: AD | Admit: 2017-04-23 | Discharge: 2017-04-23 | Disposition: A | Discharge status: Home / Self Care | Payer: Self-pay | Source: Ambulatory Visit | Attending: Obstetrics and Gynecology | Admitting: Obstetrics and Gynecology

## 2017-04-23 DIAGNOSIS — R319 Hematuria, unspecified: Secondary | ICD-10-CM | POA: Insufficient documentation

## 2017-04-23 DIAGNOSIS — O26853 Spotting complicating pregnancy, third trimester: Secondary | ICD-10-CM

## 2017-04-23 DIAGNOSIS — O99613 Diseases of the digestive system complicating pregnancy, third trimester: Secondary | ICD-10-CM | POA: Insufficient documentation

## 2017-04-23 DIAGNOSIS — O26893 Other specified pregnancy related conditions, third trimester: Secondary | ICD-10-CM

## 2017-04-23 DIAGNOSIS — O468X3 Other antepartum hemorrhage, third trimester: Secondary | ICD-10-CM | POA: Insufficient documentation

## 2017-04-23 DIAGNOSIS — O3413 Maternal care for benign tumor of corpus uteri, third trimester: Secondary | ICD-10-CM | POA: Insufficient documentation

## 2017-04-23 DIAGNOSIS — Z3A35 35 weeks gestation of pregnancy: Secondary | ICD-10-CM | POA: Insufficient documentation

## 2017-04-23 DIAGNOSIS — R12 Heartburn: Secondary | ICD-10-CM | POA: Insufficient documentation

## 2017-04-23 LAB — URINALYSIS, ROUTINE W REFLEX MICROSCOPIC
GLUCOSE, UA: NEGATIVE mg/dL
KETONES UR: NEGATIVE mg/dL
LEUKOCYTES UA: NEGATIVE
Nitrite: NEGATIVE
PH: 7 (ref 5.0–8.0)
Specific Gravity, Urine: 1.037 — ABNORMAL HIGH (ref 1.005–1.030)

## 2017-04-23 LAB — CBC
HCT: 37.8 % (ref 36.0–46.0)
Hemoglobin: 13.2 g/dL (ref 12.0–15.0)
MCH: 30.6 pg (ref 26.0–34.0)
MCHC: 34.9 g/dL (ref 30.0–36.0)
MCV: 87.5 fL (ref 78.0–100.0)
PLATELETS: 251 10*3/uL (ref 150–400)
RBC: 4.32 MIL/uL (ref 3.87–5.11)
RDW: 13.8 % (ref 11.5–15.5)
WBC: 4.5 10*3/uL (ref 4.0–10.5)

## 2017-04-23 LAB — URINALYSIS, MICROSCOPIC (REFLEX): SQUAMOUS EPITHELIAL / LPF: NONE SEEN

## 2017-04-23 LAB — KLEIHAUER-BETKE STAIN
# VIALS RHIG: 1
Fetal Cells %: 0 %
Quantitation Fetal Hemoglobin: 0 mL

## 2017-04-23 LAB — WET PREP, GENITAL
CLUE CELLS WET PREP: NONE SEEN
Sperm: NONE SEEN
TRICH WET PREP: NONE SEEN
YEAST WET PREP: NONE SEEN

## 2017-04-23 MED ORDER — OMEPRAZOLE 20 MG PO CPDR: 20.0000 mg | DELAYED_RELEASE_CAPSULE | Freq: Every day | ORAL | 5 refills | Status: DC

## 2017-04-23 MED ORDER — GI COCKTAIL ~~LOC~~
30.0000 mL | Freq: Once | ORAL | Status: AC
  Administered 2017-04-23: 30 mL via ORAL
  Filled 2017-04-23: qty 30

## 2017-04-23 MED ORDER — CEPHALEXIN 500 MG PO CAPS: 500.0000 mg | ORAL_CAPSULE | Freq: Four times a day (QID) | ORAL | 0 refills | Status: DC

## 2017-04-23 MED ORDER — OMEPRAZOLE 40 MG PO CPDR: 40.0000 mg | DELAYED_RELEASE_CAPSULE | Freq: Every day | ORAL | 5 refills | Status: DC

## 2017-04-23 NOTE — MAU Note (Signed)
Pt states she saw blood with wiping this morning, saw this again a little while later.  Pt states she has some lower abd pain which is not new today.  Also back pain for several days.  Reports good fetal movement.

## 2017-04-23 NOTE — MAU Provider Note (Signed)
Chief Complaint:  Vaginal Bleeding   First Provider Initiated Contact with Patient 04/23/17 1413      HPI: Lisa Liu is a 21 y.o. G2P0010 at [redacted]w[redacted]d who presents to maternity admissions reporting pink spotting when wiping after urinating starting today. She has not required a pad and is not seeing blood in her underwear between trips to the bathroom. She denies any associated symptoms and has no dysuria, cramping or contractions, or n/v. She reports good fetal movement, denies LOF, vaginal itching/burning, urinary symptoms, h/a, dizziness, n/v, or fever/chills.    HPI  Past Medical History: Past Medical History:  Diagnosis Date  . Fibroids     Past obstetric history: OB History  Gravida Para Term Preterm AB Living  2 0     1 0  SAB TAB Ectopic Multiple Live Births  1            # Outcome Date GA Lbr Len/2nd Weight Sex Delivery Anes PTL Lv  2 Current           1 SAB 07/2016 [redacted]w[redacted]d             Past Surgical History: History reviewed. No pertinent surgical history.  Family History: Family History  Problem Relation Age of Onset  . Cancer Father   . Varicose Veins Father   . Diabetes Paternal Grandmother   . Hyperlipidemia Paternal Grandmother     Social History: Social History  Substance Use Topics  . Smoking status: Never Smoker  . Smokeless tobacco: Never Used  . Alcohol use No    Allergies: No Known Allergies  Meds:  No prescriptions prior to admission.    ROS:  Review of Systems  Constitutional: Negative for chills, fatigue and fever.  Eyes: Negative for visual disturbance.  Respiratory: Negative for shortness of breath.   Cardiovascular: Negative for chest pain.  Gastrointestinal: Negative for abdominal pain, nausea and vomiting.  Genitourinary: Positive for vaginal bleeding. Negative for difficulty urinating, dysuria, flank pain, pelvic pain, vaginal discharge and vaginal pain.  Neurological: Negative for dizziness and headaches.   Psychiatric/Behavioral: Negative.      I have reviewed patient's Past Medical Hx, Surgical Hx, Family Hx, Social Hx, medications and allergies.   Physical Exam   Patient Vitals for the past 24 hrs:  BP Temp Temp src Pulse Resp  04/23/17 1343 111/79 98.1 F (36.7 C) Oral (!) 114 16   Constitutional: Well-developed, well-nourished female in no acute distress.  Cardiovascular: normal rate Respiratory: normal effort GI: Abd soft, non-tender, gravid appropriate for gestational age.  MS: Extremities nontender, no edema, normal ROM Neurologic: Alert and oriented x 4.  GU: Neg CVAT.  PELVIC EXAM: Cervix pink, visually closed, without lesion, no bleeding noted, scant white creamy discharge, vaginal walls and external genitalia normal   Dilation: 1 Effacement (%): 60 Station: -3 Presentation: Vertex Exam by:: Danelle Berry CNM   FHT:  Baseline 135 , moderate variability, accelerations present, no decelerations Contractions: irritability with occasional contractions lasting 50 sec, mild to palpation   Labs: Results for orders placed or performed during the hospital encounter of 04/23/17 (from the past 24 hour(s))  Urinalysis, Routine w reflex microscopic     Status: Abnormal   Collection Time: 04/23/17  1:46 PM  Result Value Ref Range   Color, Urine BROWN (A) YELLOW   APPearance TURBID (A) CLEAR   Specific Gravity, Urine  1.005 - 1.030    TEST NOT REPORTED DUE TO COLOR INTERFERENCE OF URINE PIGMENT  pH  5.0 - 8.0    TEST NOT REPORTED DUE TO COLOR INTERFERENCE OF URINE PIGMENT   Glucose, UA (A) NEGATIVE mg/dL    TEST NOT REPORTED DUE TO COLOR INTERFERENCE OF URINE PIGMENT   Hgb urine dipstick (A) NEGATIVE    TEST NOT REPORTED DUE TO COLOR INTERFERENCE OF URINE PIGMENT   Bilirubin Urine (A) NEGATIVE    TEST NOT REPORTED DUE TO COLOR INTERFERENCE OF URINE PIGMENT   Ketones, ur (A) NEGATIVE mg/dL    TEST NOT REPORTED DUE TO COLOR INTERFERENCE OF URINE PIGMENT   Protein, ur (A)  NEGATIVE mg/dL    TEST NOT REPORTED DUE TO COLOR INTERFERENCE OF URINE PIGMENT   Nitrite (A) NEGATIVE    TEST NOT REPORTED DUE TO COLOR INTERFERENCE OF URINE PIGMENT   Leukocytes, UA (A) NEGATIVE    TEST NOT REPORTED DUE TO COLOR INTERFERENCE OF URINE PIGMENT  Urinalysis, Microscopic (reflex)     Status: Abnormal   Collection Time: 04/23/17  1:46 PM  Result Value Ref Range   RBC / HPF TOO NUMEROUS TO COUNT 0 - 5 RBC/hpf   WBC, UA 0-5 0 - 5 WBC/hpf   Bacteria, UA RARE (A) NONE SEEN   Squamous Epithelial / LPF NONE SEEN NONE SEEN  Wet prep, genital     Status: Abnormal   Collection Time: 04/23/17  2:25 PM  Result Value Ref Range   Yeast Wet Prep HPF POC NONE SEEN NONE SEEN   Trich, Wet Prep NONE SEEN NONE SEEN   Clue Cells Wet Prep HPF POC NONE SEEN NONE SEEN   WBC, Wet Prep HPF POC FEW (A) NONE SEEN   Sperm NONE SEEN   CBC     Status: None   Collection Time: 04/23/17  4:08 PM  Result Value Ref Range   WBC 4.5 4.0 - 10.5 K/uL   RBC 4.32 3.87 - 5.11 MIL/uL   Hemoglobin 13.2 12.0 - 15.0 g/dL   HCT 37.8 36.0 - 46.0 %   MCV 87.5 78.0 - 100.0 fL   MCH 30.6 26.0 - 34.0 pg   MCHC 34.9 30.0 - 36.0 g/dL   RDW 13.8 11.5 - 15.5 %   Platelets 251 150 - 400 K/uL  Kleihauer-Betke stain     Status: None   Collection Time: 04/23/17  4:08 PM  Result Value Ref Range   Fetal Cells % 0 %   Quantitation Fetal Hemoglobin 0 mL   # Vials RhIg 1   Urinalysis, Routine w reflex microscopic     Status: Abnormal   Collection Time: 04/23/17  4:14 PM  Result Value Ref Range   Color, Urine AMBER (A) YELLOW   APPearance CLOUDY (A) CLEAR   Specific Gravity, Urine 1.037 (H) 1.005 - 1.030   pH 7.0 5.0 - 8.0   Glucose, UA NEGATIVE NEGATIVE mg/dL   Hgb urine dipstick MODERATE (A) NEGATIVE   Bilirubin Urine SMALL (A) NEGATIVE   Ketones, ur NEGATIVE NEGATIVE mg/dL   Protein, ur >=300 (A) NEGATIVE mg/dL   Nitrite NEGATIVE NEGATIVE   Leukocytes, UA NEGATIVE NEGATIVE   RBC / HPF TOO NUMEROUS TO COUNT 0 - 5  RBC/hpf   WBC, UA 0-5 0 - 5 WBC/hpf   Bacteria, UA FEW (A) NONE SEEN   Squamous Epithelial / LPF 0-5 (A) NONE SEEN   Mucus PRESENT    --/--/A POS, A POS (09/08 0205)  Imaging:    MAU Course/MDM: I have ordered labs and reviewed results.  NST reviewed  and reactive No bleeding noted on exam Initial urine with too much blood to result so I&O cath for second sample with results listed Cervix unchanged in 2+ hours in MAU, minimal irregular contractions on toco so no evidence of preterm labor Consult Dr Ilda Basset with presentation, exam findings and test results.  Treat for UTI with hematuria, Keflex QID x 7 days Treat heartburn with Prilosec daily since Pepcid not working Urine sent for culture Bleeding precautions reviewed Pt stable at time of discharge.   Assessment: 1. Hematuria, unspecified type   2. [redacted] weeks gestation of pregnancy   3. Heartburn during pregnancy in third trimester     Plan: Discharge home with bleeding precautions Labor precautions and fetal kick counts Follow-up [facility] for [facility] at [facility] Follow up.   Specialty:  Obstetrics and Gynecology Why:  As scheduled, return to MAU as needed for emergencies Contact information: [place] [phone]         Allergies as of 04/23/2017   No Known Allergies     Medication List    STOP taking these medications   famotidine 20 MG tablet Commonly known as:  PEPCID     TAKE these medications   cephALEXin 500 MG capsule Commonly known as:  KEFLEX Take 1 capsule (500 mg total) by mouth 4 (four) times daily.   docusate sodium 100 MG capsule Commonly known as:  COLACE Take 1 capsule (100 mg total) by mouth 2 (two) times daily as needed for mild constipation.   omeprazole 20 MG capsule Commonly known as:  PRILOSEC Take 1 capsule (20 mg total) by mouth daily.   ondansetron 8 MG tablet Commonly known as:  ZOFRAN Take 1  tablet (8 mg total) by mouth every 8 (eight) hours as needed for nausea or vomiting.   potassium chloride SA 15 MEQ tablet Commonly known as:  KLOR-CON M15 Take 2 tablets (30 mEq total) by mouth 2 (two) times daily before a meal.   prenatal multivitamin Tabs tablet Take 1 tablet by mouth at bedtime.            Discharge Care Instructions        Start     Ordered   04/23/17 0000  cephALEXin (KEFLEX) 500 MG capsule  4 times daily    Question:  Supervising Provider  Answer:  Aletha Halim   04/23/17 1817   04/23/17 0000  omeprazole (PRILOSEC) 20 MG capsule  Daily    Question:  Supervising Provider  Answer:  Aletha Halim   04/23/17 1817   04/23/17 0000  Discharge patient    Question Answer Comment  Discharge disposition 01-Home or Self Care   Discharge patient date 04/23/2017      04/23/17 [place] Certified Nurse-Midwife 04/23/2017 9:02 PM

## 2017-04-24 LAB — CULTURE, OB URINE: Culture: NO GROWTH

## 2017-04-24 LAB — GC/CHLAMYDIA PROBE AMP (~~LOC~~) NOT AT ARMC
CHLAMYDIA, DNA PROBE: NEGATIVE
NEISSERIA GONORRHEA: NEGATIVE

## 2017-04-28 ENCOUNTER — Ambulatory Visit (HOSPITAL_COMMUNITY)
Admission: RE | Admit: 2017-04-28 | Discharge: 2017-04-28 | Disposition: A | Discharge status: Home / Self Care | Payer: Self-pay | Source: Ambulatory Visit | Attending: Student | Admitting: Student

## 2017-04-28 ENCOUNTER — Other Ambulatory Visit: Payer: Self-pay | Admitting: Student

## 2017-04-28 DIAGNOSIS — Z3483 Encounter for supervision of other normal pregnancy, third trimester: Secondary | ICD-10-CM

## 2017-04-28 DIAGNOSIS — Z3A36 36 weeks gestation of pregnancy: Secondary | ICD-10-CM | POA: Insufficient documentation

## 2017-04-28 DIAGNOSIS — D259 Leiomyoma of uterus, unspecified: Secondary | ICD-10-CM

## 2017-04-28 DIAGNOSIS — Z362 Encounter for other antenatal screening follow-up: Secondary | ICD-10-CM | POA: Insufficient documentation

## 2017-04-28 DIAGNOSIS — O3413 Maternal care for benign tumor of corpus uteri, third trimester: Secondary | ICD-10-CM | POA: Insufficient documentation

## 2017-04-28 DIAGNOSIS — O3410 Maternal care for benign tumor of corpus uteri, unspecified trimester: Secondary | ICD-10-CM

## 2017-04-29 ENCOUNTER — Encounter: Payer: Self-pay | Admitting: Obstetrics and Gynecology

## 2017-04-30 ENCOUNTER — Ambulatory Visit (INDEPENDENT_AMBULATORY_CARE_PROVIDER_SITE_OTHER): Payer: Self-pay | Admitting: Family Medicine

## 2017-04-30 VITALS — BP 119/83 | HR 83 | Wt 146.0 lb

## 2017-04-30 DIAGNOSIS — Z113 Encounter for screening for infections with a predominantly sexual mode of transmission: Secondary | ICD-10-CM

## 2017-04-30 DIAGNOSIS — Z23 Encounter for immunization: Secondary | ICD-10-CM

## 2017-04-30 DIAGNOSIS — Z3483 Encounter for supervision of other normal pregnancy, third trimester: Secondary | ICD-10-CM

## 2017-04-30 NOTE — Progress Notes (Signed)
    PRENATAL VISIT NOTE  Subjective:  Lisa Liu is a 21 y.o. G2P0010 at [redacted]w[redacted]d being seen today for ongoing prenatal care.  She is currently monitored for the following issues for this low-risk pregnancy and has Fibroid uterus; Uterine fibroids affecting pregnancy, antepartum; Nausea and vomiting of pregnancy, antepartum; Supervision of normal pregnancy in third trimester; Insufficient weight gain during pregnancy; Liver dysfunction; and Hypokalemia on her problem list.  Patient reports no complaints.  Contractions: Not present. Vag. Bleeding: None.  Movement: Present. Denies leaking of fluid.   The following portions of the patient's history were reviewed and updated as appropriate: allergies, current medications, past family history, past medical history, past social history, past surgical history and problem list. Problem list updated.  Objective:   Vitals:   04/30/17 1146  BP: 119/83  Pulse: 83  Weight: 146 lb (66.2 kg)    Fetal Status: Fetal Heart Rate (bpm): 136 Fundal Height: 32 cm Movement: Present  Presentation: Vertex  General:  Alert, oriented and cooperative. Patient is in no acute distress.  Skin: Skin is warm and dry. No rash noted.   Cardiovascular: Normal heart rate noted  Respiratory: Normal respiratory effort, no problems with respiration noted  Abdomen: Soft, gravid, appropriate for gestational age.  Pain/Pressure: Present     Pelvic: Cervical exam performed Dilation: 1.5 Effacement (%): 80 Station: -1  Extremities: Normal range of motion.  Edema: None  Mental Status:  Normal mood and affect. Normal behavior. Normal judgment and thought content.  U/S 9/25, vtx, EFW 2630 gm 5 lb 13 oz (43%), nml AFI Assessment and Plan:  Pregnancy: G2P0010 at [redacted]w[redacted]d  1. Encounter for supervision of other normal pregnancy in third trimester Continue routine prenatal care.  - Cervicovaginal ancillary only - Strep Gp B NAA - Flu Vaccine QUAD 36+ mos IM (Fluarix, Quad  PF)  Preterm labor symptoms and general obstetric precautions including but not limited to vaginal bleeding, contractions, leaking of fluid and fetal movement were reviewed in detail with the patient. Please refer to After Visit Summary for other counseling recommendations.  Return in 1 week (on 05/07/2017).   Donnamae Jude, MD

## 2017-04-30 NOTE — Progress Notes (Signed)
No BRx BP since 04/07/17 - Needs to be on normal visit schecdule

## 2017-04-30 NOTE — Patient Instructions (Signed)
Breastfeeding Deciding to breastfeed is one of the best choices you can make for you and your baby. A change in hormones during pregnancy causes your breast tissue to grow and increases the number and size of your milk ducts. These hormones also allow proteins, sugars, and fats from your blood supply to make breast milk in your milk-producing glands. Hormones prevent breast milk from being released before your baby is born as well as prompt milk flow after birth. Once breastfeeding has begun, thoughts of your baby, as well as his or her sucking or crying, can stimulate the release of milk from your milk-producing glands. Benefits of breastfeeding For Your Baby  Your first milk (colostrum) helps your baby's digestive system function better.  There are antibodies in your milk that help your baby fight off infections.  Your baby has a lower incidence of asthma, allergies, and sudden infant death syndrome.  The nutrients in breast milk are better for your baby than infant formulas and are designed uniquely for your baby's needs.  Breast milk improves your baby's brain development.  Your baby is less likely to develop other conditions, such as childhood obesity, asthma, or type 2 diabetes mellitus.  For You  Breastfeeding helps to create a very special bond between you and your baby.  Breastfeeding is convenient. Breast milk is always available at the correct temperature and costs nothing.  Breastfeeding helps to burn calories and helps you lose the weight gained during pregnancy.  Breastfeeding makes your uterus contract to its prepregnancy size faster and slows bleeding (lochia) after you give birth.  Breastfeeding helps to lower your risk of developing type 2 diabetes mellitus, osteoporosis, and breast or ovarian cancer later in life.  Signs that your baby is hungry Early Signs of Hunger  Increased alertness or activity.  Stretching.  Movement of the head from side to  side.  Movement of the head and opening of the mouth when the corner of the mouth or cheek is stroked (rooting).  Increased sucking sounds, smacking lips, cooing, sighing, or squeaking.  Hand-to-mouth movements.  Increased sucking of fingers or hands.  Late Signs of Hunger  Fussing.  Intermittent crying.  Extreme Signs of Hunger Signs of extreme hunger will require calming and consoling before your baby will be able to breastfeed successfully. Do not wait for the following signs of extreme hunger to occur before you initiate breastfeeding:  Restlessness.  A loud, strong cry.  Screaming.  Breastfeeding basics Breastfeeding Initiation  Find a comfortable place to sit or lie down, with your neck and back well supported.  Place a pillow or rolled up blanket under your baby to bring him or her to the level of your breast (if you are seated). Nursing pillows are specially designed to help support your arms and your baby while you breastfeed.  Make sure that your baby's abdomen is facing your abdomen.  Gently massage your breast. With your fingertips, massage from your chest wall toward your nipple in a circular motion. This encourages milk flow. You may need to continue this action during the feeding if your milk flows slowly.  Support your breast with 4 fingers underneath and your thumb above your nipple. Make sure your fingers are well away from your nipple and your baby's mouth.  Stroke your baby's lips gently with your finger or nipple.  When your baby's mouth is open wide enough, quickly bring your baby to your breast, placing your entire nipple and as much of the colored area   around your nipple (areola) as possible into your baby's mouth. ? More areola should be visible above your baby's upper lip than below the lower lip. ? Your baby's tongue should be between his or her lower gum and your breast.  Ensure that your baby's mouth is correctly positioned around your nipple  (latched). Your baby's lips should create a seal on your breast and be turned out (everted).  It is common for your baby to suck about 2-3 minutes in order to start the flow of breast milk.  Latching Teaching your baby how to latch on to your breast properly is very important. An improper latch can cause nipple pain and decreased milk supply for you and poor weight gain in your baby. Also, if your baby is not latched onto your nipple properly, he or she may swallow some air during feeding. This can make your baby fussy. Burping your baby when you switch breasts during the feeding can help to get rid of the air. However, teaching your baby to latch on properly is still the best way to prevent fussiness from swallowing air while breastfeeding. Signs that your baby has successfully latched on to your nipple:  Silent tugging or silent sucking, without causing you pain.  Swallowing heard between every 3-4 sucks.  Muscle movement above and in front of his or her ears while sucking.  Signs that your baby has not successfully latched on to nipple:  Sucking sounds or smacking sounds from your baby while breastfeeding.  Nipple pain.  If you think your baby has not latched on correctly, slip your finger into the corner of your baby's mouth to break the suction and place it between your baby's gums. Attempt breastfeeding initiation again. Signs of Successful Breastfeeding Signs from your baby:  A gradual decrease in the number of sucks or complete cessation of sucking.  Falling asleep.  Relaxation of his or her body.  Retention of a small amount of milk in his or her mouth.  Letting go of your breast by himself or herself.  Signs from you:  Breasts that have increased in firmness, weight, and size 1-3 hours after feeding.  Breasts that are softer immediately after breastfeeding.  Increased milk volume, as well as a change in milk consistency and color by the fifth day of  breastfeeding.  Nipples that are not sore, cracked, or bleeding.  Signs That Your Baby is Getting Enough Milk  Wetting at least 1-2 diapers during the first 24 hours after birth.  Wetting at least 5-6 diapers every 24 hours for the first week after birth. The urine should be clear or pale yellow by 5 days after birth.  Wetting 6-8 diapers every 24 hours as your baby continues to grow and develop.  At least 3 stools in a 24-hour period by age 5 days. The stool should be soft and yellow.  At least 3 stools in a 24-hour period by age 7 days. The stool should be seedy and yellow.  No loss of weight greater than 10% of birth weight during the first 3 days of age.  Average weight gain of 4-7 ounces (113-198 g) per week after age 4 days.  Consistent daily weight gain by age 5 days, without weight loss after the age of 2 weeks.  After a feeding, your baby may spit up a small amount. This is common. Breastfeeding frequency and duration Frequent feeding will help you make more milk and can prevent sore nipples and breast engorgement. Breastfeed when   you feel the need to reduce the fullness of your breasts or when your baby shows signs of hunger. This is called "breastfeeding on demand." Avoid introducing a pacifier to your baby while you are working to establish breastfeeding (the first 4-6 weeks after your baby is born). After this time you may choose to use a pacifier. Research has shown that pacifier use during the first year of a baby's life decreases the risk of sudden infant death syndrome (SIDS). Allow your baby to feed on each breast as long as he or she wants. Breastfeed until your baby is finished feeding. When your baby unlatches or falls asleep while feeding from the first breast, offer the second breast. Because newborns are often sleepy in the first few weeks of life, you may need to awaken your baby to get him or her to feed. Breastfeeding times will vary from baby to baby. However,  the following rules can serve as a guide to help you ensure that your baby is properly fed:  Newborns (babies 4 weeks of age or younger) may breastfeed every 1-3 hours.  Newborns should not go longer than 3 hours during the day or 5 hours during the night without breastfeeding.  You should breastfeed your baby a minimum of 8 times in a 24-hour period until you begin to introduce solid foods to your baby at around 6 months of age.  Breast milk pumping Pumping and storing breast milk allows you to ensure that your baby is exclusively fed your breast milk, even at times when you are unable to breastfeed. This is especially important if you are going back to work while you are still breastfeeding or when you are not able to be present during feedings. Your lactation consultant can give you guidelines on how long it is safe to store breast milk. A breast pump is a machine that allows you to pump milk from your breast into a sterile bottle. The pumped breast milk can then be stored in a refrigerator or freezer. Some breast pumps are operated by hand, while others use electricity. Ask your lactation consultant which type will work best for you. Breast pumps can be purchased, but some hospitals and breastfeeding support groups lease breast pumps on a monthly basis. A lactation consultant can teach you how to hand express breast milk, if you prefer not to use a pump. Caring for your breasts while you breastfeed Nipples can become dry, cracked, and sore while breastfeeding. The following recommendations can help keep your breasts moisturized and healthy:  Avoid using soap on your nipples.  Wear a supportive bra. Although not required, special nursing bras and tank tops are designed to allow access to your breasts for breastfeeding without taking off your entire bra or top. Avoid wearing underwire-style bras or extremely tight bras.  Air dry your nipples for 3-4minutes after each feeding.  Use only cotton  bra pads to absorb leaked breast milk. Leaking of breast milk between feedings is normal.  Use lanolin on your nipples after breastfeeding. Lanolin helps to maintain your skin's normal moisture barrier. If you use pure lanolin, you do not need to wash it off before feeding your baby again. Pure lanolin is not toxic to your baby. You may also hand express a few drops of breast milk and gently massage that milk into your nipples and allow the milk to air dry.  In the first few weeks after giving birth, some women experience extremely full breasts (engorgement). Engorgement can make your   breasts feel heavy, warm, and tender to the touch. Engorgement peaks within 3-5 days after you give birth. The following recommendations can help ease engorgement:  Completely empty your breasts while breastfeeding or pumping. You may want to start by applying warm, moist heat (in the shower or with warm water-soaked hand towels) just before feeding or pumping. This increases circulation and helps the milk flow. If your baby does not completely empty your breasts while breastfeeding, pump any extra milk after he or she is finished.  Wear a snug bra (nursing or regular) or tank top for 1-2 days to signal your body to slightly decrease milk production.  Apply ice packs to your breasts, unless this is too uncomfortable for you.  Make sure that your baby is latched on and positioned properly while breastfeeding.  If engorgement persists after 48 hours of following these recommendations, contact your health care provider or a lactation consultant. Overall health care recommendations while breastfeeding  Eat healthy foods. Alternate between meals and snacks, eating 3 of each per day. Because what you eat affects your breast milk, some of the foods may make your baby more irritable than usual. Avoid eating these foods if you are sure that they are negatively affecting your baby.  Drink milk, fruit juice, and water to  satisfy your thirst (about 10 glasses a day).  Rest often, relax, and continue to take your prenatal vitamins to prevent fatigue, stress, and anemia.  Continue breast self-awareness checks.  Avoid chewing and smoking tobacco. Chemicals from cigarettes that pass into breast milk and exposure to secondhand smoke may harm your baby.  Avoid alcohol and drug use, including marijuana. Some medicines that may be harmful to your baby can pass through breast milk. It is important to ask your health care provider before taking any medicine, including all over-the-counter and prescription medicine as well as vitamin and herbal supplements. It is possible to become pregnant while breastfeeding. If birth control is desired, ask your health care provider about options that will be safe for your baby. Contact a health care provider if:  You feel like you want to stop breastfeeding or have become frustrated with breastfeeding.  You have painful breasts or nipples.  Your nipples are cracked or bleeding.  Your breasts are red, tender, or warm.  You have a swollen area on either breast.  You have a fever or chills.  You have nausea or vomiting.  You have drainage other than breast milk from your nipples.  Your breasts do not become full before feedings by the fifth day after you give birth.  You feel sad and depressed.  Your baby is too sleepy to eat well.  Your baby is having trouble sleeping.  Your baby is wetting less than 3 diapers in a 24-hour period.  Your baby has less than 3 stools in a 24-hour period.  Your baby's skin or the white part of his or her eyes becomes yellow.  Your baby is not gaining weight by 5 days of age. Get help right away if:  Your baby is overly tired (lethargic) and does not want to wake up and feed.  Your baby develops an unexplained fever. This information is not intended to replace advice given to you by your health care provider. Make sure you discuss  any questions you have with your health care provider. Document Released: 07/21/2005 Document Revised: 01/02/2016 Document Reviewed: 01/12/2013 Elsevier Interactive Patient Education  2017 [employer]  

## 2017-05-01 LAB — CERVICOVAGINAL ANCILLARY ONLY
Chlamydia: NEGATIVE
Neisseria Gonorrhea: NEGATIVE

## 2017-05-02 LAB — STREP GP B NAA: STREP GROUP B AG: NEGATIVE

## 2017-05-03 ENCOUNTER — Encounter (HOSPITAL_COMMUNITY): Payer: Self-pay

## 2017-05-03 ENCOUNTER — Inpatient Hospital Stay (HOSPITAL_COMMUNITY)
Admission: AD | Admit: 2017-05-03 | Discharge: 2017-05-03 | Disposition: A | Discharge status: Home / Self Care | Payer: Self-pay | Source: Ambulatory Visit | Attending: Family Medicine | Admitting: Family Medicine

## 2017-05-03 DIAGNOSIS — Z79899 Other long term (current) drug therapy: Secondary | ICD-10-CM | POA: Insufficient documentation

## 2017-05-03 DIAGNOSIS — O26893 Other specified pregnancy related conditions, third trimester: Secondary | ICD-10-CM | POA: Insufficient documentation

## 2017-05-03 DIAGNOSIS — O9989 Other specified diseases and conditions complicating pregnancy, childbirth and the puerperium: Secondary | ICD-10-CM

## 2017-05-03 DIAGNOSIS — R319 Hematuria, unspecified: Secondary | ICD-10-CM | POA: Insufficient documentation

## 2017-05-03 DIAGNOSIS — Z3A36 36 weeks gestation of pregnancy: Secondary | ICD-10-CM | POA: Insufficient documentation

## 2017-05-03 LAB — URINALYSIS, ROUTINE W REFLEX MICROSCOPIC

## 2017-05-03 LAB — WET PREP, GENITAL
CLUE CELLS WET PREP: NONE SEEN
SPERM: NONE SEEN
Trich, Wet Prep: NONE SEEN
Yeast Wet Prep HPF POC: NONE SEEN

## 2017-05-03 LAB — URINALYSIS, MICROSCOPIC (REFLEX)

## 2017-05-03 LAB — OB RESULTS CONSOLE GC/CHLAMYDIA: GC PROBE AMP, GENITAL: NEGATIVE

## 2017-05-03 NOTE — Discharge Instructions (Signed)
Preventing Preterm Birth °Preterm birth is when your baby is delivered between 20 weeks and 37 weeks of pregnancy. A full-term pregnancy lasts for at least 37 weeks. Preterm birth can be dangerous for your baby because the last few weeks of pregnancy are an important time for your baby's brain and lungs to grow. Many things can cause a baby to be born early. Sometimes the cause is not known. There are certain factors that make you more likely to experience preterm birth, such as: °· Having a previous baby born preterm. °· Being pregnant with twins or other multiples. °· Having had fertility treatment. °· Being overweight or underweight at the start of your pregnancy. °· Having any of the following during pregnancy: °? An infection, including a urinary tract infection (UTI) or an STI (sexually transmitted infection). °? High blood pressure. °? Diabetes. °? Vaginal bleeding. °· Being age 35 or older. °· Being age 18 or younger. °· Getting pregnant within 6 months of a previous pregnancy. °· Suffering extreme stress or physical or emotional abuse during pregnancy. °· Standing for long periods of time during pregnancy, such as working at a job that requires standing. ° °What are the risks? °The most serious risk of preterm birth is that the baby may not survive. This is more likely to happen if a baby is born before 34 weeks. Other risks and complications of preterm birth may include your baby having: °· Breathing problems. °· Brain damage that affects movement and coordination (cerebral palsy). °· Feeding difficulties. °· Vision or hearing problems. °· Infections or inflammation of the digestive tract (colitis). °· Developmental delays. °· Learning disabilities. °· Higher risk for diabetes, heart disease, and high blood pressure later in life. ° °What can I do to lower my risk? °Medical care ° °The most important thing you can do to lower your risk for preterm birth is to get routine medical care during pregnancy  (prenatal care). If you have a high risk of preterm birth, you may be referred to a health care provider who specializes in managing high-risk pregnancies (perinatologist). You may be given medicine to help prevent preterm birth. °Lifestyle changes °Certain lifestyle changes can also lower your risk of preterm birth: °· Wait at least 6 months after a pregnancy to become pregnant again. °· Try to plan pregnancy for when you are between 19 and 35 years old. °· Get to a healthy weight before getting pregnant. If you are overweight, work with your health care provider to safely lose weight. °· Do not use any products that contain nicotine or tobacco, such as cigarettes and e-cigarettes. If you need help quitting, ask your health care provider. °· Do not drink alcohol. °· Do not use drugs. ° °Where to find support: °For more support, consider: °· Talking with your health care provider. °· Talking with a therapist or substance abuse counselor, if you need help quitting. °· Working with a diet and nutrition specialist (dietitian) or a personal trainer to maintain a healthy weight. °· Joining a support group. ° °Where to find more information: °Learn more about preventing preterm birth from: °· Centers for Disease Control and Prevention: cdc.gov/reproductivehealth/maternalinfanthealth/pretermbirth.htm °· March of Dimes: marchofdimes.org/complications/premature-babies.aspx °· American Pregnancy Association: americanpregnancy.org/labor-and-birth/premature-labor ° °Contact a health care provider if: °· You have any of the following signs of preterm labor before 37 weeks: °? A change or increase in vaginal discharge. °? Fluid leaking from your vagina. °? Pressure or cramps in your lower abdomen. °? A backache that does not   go away or gets worse. °? Regular tightening (contractions) in your lower abdomen. °Summary °· Preterm birth means having your baby during weeks 20-37 of pregnancy. °· Preterm birth may put your baby at risk  for physical and mental problems. °· Getting good prenatal care can help prevent preterm birth. °· You can lower your risk of preterm birth by making certain lifestyle changes, such as not smoking and not using alcohol. °This information is not intended to replace advice given to you by your health care provider. Make sure you discuss any questions you have with your health care provider. °Document Released: 09/04/2015 Document Revised: 03/29/2016 Document Reviewed: 03/29/2016 °Elsevier Interactive Patient Education © 2018 [employer] Inc. ° °

## 2017-05-03 NOTE — MAU Provider Note (Addendum)
History     CSN: [id number]  Arrival date and time: 05/03/17 1528   None     No chief complaint on file.  21 yo G2P0010 at [redacted]w[redacted]d gestation here for bleeding while she urinates. She only notices the blood when she wipes after urinating. This has occurred x 1 day. She had similar symptoms 10 days ago and was treated with keflex. Urine culture was negative. Patient denies fever, sweats, chills, contractions, CVA tenderness.     OB History    Gravida Para Term Preterm AB Living   2 0     1 0   SAB TAB Ectopic Multiple Live Births   1              Past Medical History:  Diagnosis Date  . Fibroids     No past surgical history on file.  Family History  Problem Relation Age of Onset  . Cancer Father   . Varicose Veins Father   . Diabetes Paternal Grandmother   . Hyperlipidemia Paternal Grandmother     Social History  Substance Use Topics  . Smoking status: Never Smoker  . Smokeless tobacco: Never Used  . Alcohol use No    Allergies: No Known Allergies  Prescriptions Prior to Admission  Medication Sig Dispense Refill Last Dose  . cephALEXin (KEFLEX) 500 MG capsule Take 1 capsule (500 mg total) by mouth 4 (four) times daily. 28 capsule 0 Taking  . docusate sodium (COLACE) 100 MG capsule Take 1 capsule (100 mg total) by mouth 2 (two) times daily as needed for mild constipation. 10 capsule 0 Taking  . omeprazole (PRILOSEC) 20 MG capsule Take 1 capsule (20 mg total) by mouth daily. 30 capsule 5 Taking  . ondansetron (ZOFRAN) 8 MG tablet Take 1 tablet (8 mg total) by mouth every 8 (eight) hours as needed for nausea or vomiting. 25 tablet 0 Taking  . potassium chloride (KLOR-CON M15) 15 MEQ tablet Take 2 tablets (30 mEq total) by mouth 2 (two) times daily before a meal. 6 tablet 0 Taking  . Prenatal Vit-Fe Fumarate-FA (PRENATAL MULTIVITAMIN) TABS tablet Take 1 tablet by mouth at bedtime.   Taking    Review of Systems  Constitutional: Negative for activity change and fever.   HENT: Negative for congestion and nosebleeds.   Eyes: Negative for pain and redness.  Respiratory: Negative for chest tightness and shortness of breath.   Gastrointestinal: Negative for abdominal pain, diarrhea, nausea and vomiting.  Endocrine: Negative for cold intolerance and heat intolerance.  Genitourinary: Positive for hematuria. Negative for vaginal bleeding and vaginal discharge.  Musculoskeletal: Negative for arthralgias and back pain.  Neurological: Negative for dizziness and headaches.   Physical Exam   Blood pressure 114/89, pulse (!) 106, temperature 97.9 F (36.6 C), resp. rate 16, height 5\' 5"  (1.651 m), weight 151 lb (68.5 kg), last menstrual period 08/18/2016, unknown if currently breastfeeding.  Physical Exam  Constitutional: She is oriented to person, place, and time. She appears well-developed and well-nourished. She appears distressed.  HENT:  Head: Normocephalic and atraumatic.  Eyes: Pupils are equal, round, and reactive to light. Conjunctivae are normal.  Neck: Normal range of motion. Neck supple.  Cardiovascular: Normal rate and intact distal pulses.   Respiratory: She is in respiratory distress.  GI: Soft. There is no tenderness.  Genitourinary: Vagina normal.  Genitourinary Comments: Cervix open on speculum exam. Digital cervical exam: 3/50/-1  Musculoskeletal: Normal range of motion. She exhibits no edema.  Neurological: She is alert  and oriented to person, place, and time.  Skin: Skin is warm and dry.  Psychiatric: She has a normal mood and affect. Her behavior is normal.    MAU Course  Procedures EFM: 150/mod var/pos acels/no decels. Toco: no contractions  MDM Uncertain etiology for hematuria. No blood in vagina. Will get urine culture.   Assessment and Plan  1. Hematuria- uncertain etiology. Suspect kidney stone but unusual since patient denies pain. Refer to urology. Urine culture pending. 2 .Pregnancy at 36 weeks- cervix unchanged after 2  hours. Patient given strict preterm labor precautions.   [employer] 05/03/2017, 4:31 PM

## 2017-05-03 NOTE — MAU Note (Signed)
Took antibiotic for UTI saw blood in urine, no pain with urination.

## 2017-05-04 LAB — GC/CHLAMYDIA PROBE AMP (~~LOC~~) NOT AT ARMC
CHLAMYDIA, DNA PROBE: NEGATIVE
NEISSERIA GONORRHEA: NEGATIVE

## 2017-05-05 LAB — URINE CULTURE: CULTURE: NO GROWTH

## 2017-05-07 ENCOUNTER — Encounter: Payer: Self-pay | Admitting: Obstetrics & Gynecology

## 2017-05-08 ENCOUNTER — Ambulatory Visit (INDEPENDENT_AMBULATORY_CARE_PROVIDER_SITE_OTHER): Payer: Self-pay | Admitting: Obstetrics & Gynecology

## 2017-05-08 VITALS — BP 119/77 | HR 99 | Wt 153.0 lb

## 2017-05-08 DIAGNOSIS — Z3483 Encounter for supervision of other normal pregnancy, third trimester: Secondary | ICD-10-CM

## 2017-05-08 NOTE — Progress Notes (Signed)
   PRENATAL VISIT NOTE  Subjective:  Lisa Liu is a 21 y.o. G2P0010 at [redacted]w[redacted]d being seen today for ongoing prenatal care.  She is currently monitored for the following issues for this low-risk pregnancy and has Fibroid uterus; Uterine fibroids affecting pregnancy, antepartum; Nausea and vomiting of pregnancy, antepartum; Supervision of normal pregnancy in third trimester; Insufficient weight gain during pregnancy; Liver dysfunction; and Hypokalemia on her problem list.  Patient reports no complaints.  Contractions: Not present.  .  Movement: Present. Denies leaking of fluid.   The following portions of the patient's history were reviewed and updated as appropriate: allergies, current medications, past family history, past medical history, past social history, past surgical history and problem list. Problem list updated.  Objective:   Vitals:   05/08/17 1108  BP: 119/77  Pulse: 99  Weight: 153 lb (69.4 kg)    Fetal Status: Fetal Heart Rate (bpm): 126   Movement: Present     General:  Alert, oriented and cooperative. Patient is in no acute distress.  Skin: Skin is warm and dry. No rash noted.   Cardiovascular: Normal heart rate noted  Respiratory: Normal respiratory effort, no problems with respiration noted  Abdomen: Soft, gravid, appropriate for gestational age.  Pain/Pressure: Present     Pelvic: Cervical exam performed        Extremities: Normal range of motion.  Edema: None  Mental Status:  Normal mood and affect. Normal behavior. Normal judgment and thought content.   Assessment and Plan:  Pregnancy: G2P0010 at [redacted]w[redacted]d  1. Encounter for supervision of other normal pregnancy in third trimester   Term labor symptoms and general obstetric precautions including but not limited to vaginal bleeding, contractions, leaking of fluid and fetal movement were reviewed in detail with the patient. Please refer to After Visit Summary for other counseling recommendations.  No  Follow-up on file.   Emily Filbert, MD

## 2017-05-11 ENCOUNTER — Telehealth: Payer: Self-pay

## 2017-05-11 ENCOUNTER — Inpatient Hospital Stay (HOSPITAL_COMMUNITY)
Admission: AD | Admit: 2017-05-11 | Discharge: 2017-05-14 | DRG: 807 | Disposition: A | Discharge status: Home / Self Care | Payer: Medicaid Other | Source: Ambulatory Visit | Attending: Family Medicine | Admitting: Family Medicine

## 2017-05-11 ENCOUNTER — Encounter (HOSPITAL_COMMUNITY): Payer: Self-pay | Admitting: *Deleted

## 2017-05-11 DIAGNOSIS — O133 Gestational [pregnancy-induced] hypertension without significant proteinuria, third trimester: Secondary | ICD-10-CM

## 2017-05-11 DIAGNOSIS — D259 Leiomyoma of uterus, unspecified: Secondary | ICD-10-CM | POA: Diagnosis present

## 2017-05-11 DIAGNOSIS — O139 Gestational [pregnancy-induced] hypertension without significant proteinuria, unspecified trimester: Secondary | ICD-10-CM

## 2017-05-11 DIAGNOSIS — O9962 Diseases of the digestive system complicating childbirth: Secondary | ICD-10-CM | POA: Diagnosis present

## 2017-05-11 DIAGNOSIS — O2662 Liver and biliary tract disorders in childbirth: Secondary | ICD-10-CM | POA: Diagnosis present

## 2017-05-11 DIAGNOSIS — K219 Gastro-esophageal reflux disease without esophagitis: Secondary | ICD-10-CM | POA: Diagnosis present

## 2017-05-11 DIAGNOSIS — O2613 Low weight gain in pregnancy, third trimester: Secondary | ICD-10-CM | POA: Diagnosis present

## 2017-05-11 DIAGNOSIS — Z3A38 38 weeks gestation of pregnancy: Secondary | ICD-10-CM

## 2017-05-11 DIAGNOSIS — O368130 Decreased fetal movements, third trimester, not applicable or unspecified: Secondary | ICD-10-CM | POA: Diagnosis present

## 2017-05-11 DIAGNOSIS — O3413 Maternal care for benign tumor of corpus uteri, third trimester: Secondary | ICD-10-CM | POA: Diagnosis present

## 2017-05-11 DIAGNOSIS — O134 Gestational [pregnancy-induced] hypertension without significant proteinuria, complicating childbirth: Principal | ICD-10-CM | POA: Diagnosis present

## 2017-05-11 LAB — CBC
HCT: 33.9 % — ABNORMAL LOW (ref 36.0–46.0)
Hemoglobin: 11.9 g/dL — ABNORMAL LOW (ref 12.0–15.0)
MCH: 30.6 pg (ref 26.0–34.0)
MCHC: 35.1 g/dL (ref 30.0–36.0)
MCV: 87.1 fL (ref 78.0–100.0)
PLATELETS: 227 10*3/uL (ref 150–400)
RBC: 3.89 MIL/uL (ref 3.87–5.11)
RDW: 14.2 % (ref 11.5–15.5)
WBC: 4.7 10*3/uL (ref 4.0–10.5)

## 2017-05-11 LAB — COMPREHENSIVE METABOLIC PANEL
ALT: 34 U/L (ref 14–54)
AST: 32 U/L (ref 15–41)
Albumin: 2.6 g/dL — ABNORMAL LOW (ref 3.5–5.0)
Alkaline Phosphatase: 192 U/L — ABNORMAL HIGH (ref 38–126)
Anion gap: 11 (ref 5–15)
BILIRUBIN TOTAL: 0.8 mg/dL (ref 0.3–1.2)
BUN: 7 mg/dL (ref 6–20)
CALCIUM: 8.7 mg/dL — AB (ref 8.9–10.3)
CO2: 20 mmol/L — ABNORMAL LOW (ref 22–32)
CREATININE: 0.79 mg/dL (ref 0.44–1.00)
Chloride: 105 mmol/L (ref 101–111)
Glucose, Bld: 93 mg/dL (ref 65–99)
Potassium: 3.6 mmol/L (ref 3.5–5.1)
Sodium: 136 mmol/L (ref 135–145)
TOTAL PROTEIN: 6.3 g/dL — AB (ref 6.5–8.1)

## 2017-05-11 NOTE — Telephone Encounter (Signed)
Spoke with patient regarding high blood pressure. When I called patient she could not tell me what her blood pressure was at that time. Baby scripts had her reading at 128/104. Patient reports no blurred vision, headaches or dizziness at this time. I have advised patient to take her blood pressure again and called [place] back with reading. Patient voice understanding and stated she is fine.

## 2017-05-11 NOTE — MAU Provider Note (Signed)
History     CSN: [id number]  Arrival date and time: 05/11/17 2158   First Provider Initiated Contact with Patient 05/11/17 2305      Chief Complaint  Patient presents with  . Contractions  . Decreased Fetal Movement   HPI  [name] is a 21 y.o. G2P0010 at [redacted]w[redacted]d gestation presenting to MAU with complaints of contractions and DFM. She reports feeling FM while in triage.  She does feel good FM now. Denies H/A, dizziness, blurry vision, epigastric pain, VB, abnormal d/c, or LOF.  Past Medical History:  Diagnosis Date  . Fibroids     History reviewed. No pertinent surgical history.  Family History  Problem Relation Age of Onset  . Cancer Father   . Varicose Veins Father   . Diabetes Paternal Grandmother   . Hyperlipidemia Paternal Grandmother     Social History  Substance Use Topics  . Smoking status: Never Smoker  . Smokeless tobacco: Never Used  . Alcohol use No    Allergies: No Known Allergies  Prescriptions Prior to Admission  Medication Sig Dispense Refill Last Dose  . omeprazole (PRILOSEC) 20 MG capsule Take 1 capsule (20 mg total) by mouth daily. 30 capsule 5 05/11/2017 at Unknown time  . Prenatal Vit-Fe Fumarate-FA (PRENATAL MULTIVITAMIN) TABS tablet Take 1 tablet by mouth at bedtime.   05/11/2017 at Unknown time  . cephALEXin (KEFLEX) 500 MG capsule Take 1 capsule (500 mg total) by mouth 4 (four) times daily. (Patient not taking: Reported on 05/08/2017) 28 capsule 0 Not Taking  . docusate sodium (COLACE) 100 MG capsule Take 1 capsule (100 mg total) by mouth 2 (two) times daily as needed for mild constipation. 10 capsule 0 Taking  . ondansetron (ZOFRAN) 8 MG tablet Take 1 tablet (8 mg total) by mouth every 8 (eight) hours as needed for nausea or vomiting. (Patient not taking: Reported on 05/08/2017) 25 tablet 0 Not Taking  . potassium chloride (KLOR-CON M15) 15 MEQ tablet Take 2 tablets (30 mEq total) by mouth 2 (two) times daily before a meal. 6  tablet 0 Taking    Review of Systems  Constitutional: Negative.   HENT: Negative.   Eyes: Negative.   Respiratory: Negative.   Cardiovascular: Negative.   Gastrointestinal: Positive for abdominal pain (contractions).  Endocrine: Negative.   Genitourinary: Positive for flank pain (upper RT (near bottom ribs)) and pelvic pain (contractions).       DFM  Skin: Negative.   Allergic/Immunologic: Negative.   Neurological: Negative.   Hematological: Negative.   Psychiatric/Behavioral: Negative.    Physical Exam   Blood pressure (!) 137/95, pulse 79, temperature 98.4 F (36.9 C), temperature source Oral, resp. rate 16, height 5\' 5"  (1.651 m), weight 70.3 kg (155 lb), last menstrual period 08/18/2016, SpO2 99 %. Patient Vitals for the past 24 hrs:  BP Temp Temp src Pulse Resp SpO2 Height Weight  05/12/17 0045 (!) 137/95 - - 84 - - - -  05/12/17 0030 (!) 136/96 - - 86 - - - -  05/12/17 0015 (!) 136/96 - - 82 - - - -  05/12/17 0000 (!) 134/94 - - 82 - - - -  05/11/17 2345 (!) 131/91 - - 87 - - - -  05/11/17 2330 (!) 131/92 - - 95 - - - -  05/11/17 2315 (!) 138/92 - - 82 - - - -  05/11/17 2300 (!) 137/95 - - 79 - - - -  05/11/17 2245 (!) 135/98 - - 79 - - - -  05/11/17 2240 128/90 - - 79 - - - -  05/11/17 2227 131/88 - - 80 - - - -  05/11/17 2208 (!) 140/97 98.4 F (36.9 C) Oral 88 16 99 % 5\' 5"  (1.651 m) 70.3 kg (155 lb)     Physical Exam  Nursing note and vitals reviewed. Constitutional: She is oriented to person, place, and time. She appears well-developed and well-nourished.  HENT:  Head: Normocephalic.  Eyes: Pupils are equal, round, and reactive to light.  Neck: Normal range of motion.  Cardiovascular: Normal rate, regular rhythm and normal heart sounds.   Respiratory: Effort normal and breath sounds normal.  GI: Soft. Bowel sounds are normal.  Genitourinary:  Genitourinary Comments: deferred  Musculoskeletal: Normal range of motion.  Neurological: She is alert and  oriented to person, place, and time.  Skin: Skin is warm and dry.  Psychiatric: She has a normal mood and affect. Her behavior is normal. Judgment and thought content normal.    MAU Course  Procedures  MDM CBC CMP P/C Ratio Serial BPs NST - FHR: 130 bpm / moderate variability / accels present / decels absent / TOCO: irregular UC's *Consult with Dr. Rip Harbour @ 0045 - notified of patient's complaints, assessments, NST, lab results - recommends admission of IOL d/t GHTN  Results for orders placed or performed during the hospital encounter of 05/11/17 (from the past 24 hour(s))  Protein / creatinine ratio, urine     Status: Abnormal   Collection Time: 05/11/17 10:10 PM  Result Value Ref Range   Creatinine, Urine 290.00 mg/dL   Total Protein, Urine 48 mg/dL   Protein Creatinine Ratio 0.17 (H) 0.00 - 0.15 mg/mg[Cre]  CBC     Status: Abnormal   Collection Time: 05/11/17 11:15 PM  Result Value Ref Range   WBC 4.7 4.0 - 10.5 K/uL   RBC 3.89 3.87 - 5.11 MIL/uL   Hemoglobin 11.9 (L) 12.0 - 15.0 g/dL   HCT 33.9 (L) 36.0 - 46.0 %   MCV 87.1 78.0 - 100.0 fL   MCH 30.6 26.0 - 34.0 pg   MCHC 35.1 30.0 - 36.0 g/dL   RDW 14.2 11.5 - 15.5 %   Platelets 227 150 - 400 K/uL  Comprehensive metabolic panel     Status: Abnormal   Collection Time: 05/11/17 11:15 PM  Result Value Ref Range   Sodium 136 135 - 145 mmol/L   Potassium 3.6 3.5 - 5.1 mmol/L   Chloride 105 101 - 111 mmol/L   CO2 20 (L) 22 - 32 mmol/L   Glucose, Bld 93 65 - 99 mg/dL   BUN 7 6 - 20 mg/dL   Creatinine, Ser 0.79 0.44 - 1.00 mg/dL   Calcium 8.7 (L) 8.9 - 10.3 mg/dL   Total Protein 6.3 (L) 6.5 - 8.1 g/dL   Albumin 2.6 (L) 3.5 - 5.0 g/dL   AST 32 15 - 41 U/L   ALT 34 14 - 54 U/L   Alkaline Phosphatase 192 (H) 38 - 126 U/L   Total Bilirubin 0.8 0.3 - 1.2 mg/dL   GFR calc non Af Amer >60 >60 mL/min   GFR calc Af Amer >60 >60 mL/min   Anion gap 11 5 - 15    Assessment and Plan  Gestational Hypertension - Admit to L&D for  IOL - Routine admission orders - Pitocin 2 x 2 - Resident notified of admission // care assumed upon admission  Laury Deep, MSN, CNM 05/12/2017, 11:05 PM

## 2017-05-11 NOTE — MAU Note (Signed)
Pt reports contractions that are getting stronger. States she has not time them because she is unsure of how to count them. Pt denies LOF or vaginal bleeding. Reports decrease in fetal movement although she has felt baby move in triage. FHR 138. Pt denies HA or vision changes.

## 2017-05-11 NOTE — Telephone Encounter (Signed)
-----   Message from Riccardo Dubin, RN sent at 05/11/2017  4:59 PM EDT ----- Regarding: Babyscripts patient Babyscripts called regarding this patient with BP reading of 128/104.

## 2017-05-12 ENCOUNTER — Encounter (HOSPITAL_COMMUNITY): Payer: Self-pay

## 2017-05-12 ENCOUNTER — Inpatient Hospital Stay (HOSPITAL_COMMUNITY): Payer: Medicaid Other | Admitting: Anesthesiology

## 2017-05-12 DIAGNOSIS — O134 Gestational [pregnancy-induced] hypertension without significant proteinuria, complicating childbirth: Secondary | ICD-10-CM | POA: Diagnosis present

## 2017-05-12 DIAGNOSIS — O2662 Liver and biliary tract disorders in childbirth: Secondary | ICD-10-CM | POA: Diagnosis present

## 2017-05-12 DIAGNOSIS — Z3A38 38 weeks gestation of pregnancy: Secondary | ICD-10-CM

## 2017-05-12 DIAGNOSIS — O139 Gestational [pregnancy-induced] hypertension without significant proteinuria, unspecified trimester: Secondary | ICD-10-CM

## 2017-05-12 DIAGNOSIS — O9962 Diseases of the digestive system complicating childbirth: Secondary | ICD-10-CM | POA: Diagnosis present

## 2017-05-12 DIAGNOSIS — O3413 Maternal care for benign tumor of corpus uteri, third trimester: Secondary | ICD-10-CM | POA: Diagnosis present

## 2017-05-12 DIAGNOSIS — O2613 Low weight gain in pregnancy, third trimester: Secondary | ICD-10-CM | POA: Diagnosis present

## 2017-05-12 DIAGNOSIS — O368130 Decreased fetal movements, third trimester, not applicable or unspecified: Secondary | ICD-10-CM | POA: Diagnosis present

## 2017-05-12 DIAGNOSIS — D259 Leiomyoma of uterus, unspecified: Secondary | ICD-10-CM | POA: Diagnosis present

## 2017-05-12 DIAGNOSIS — K219 Gastro-esophageal reflux disease without esophagitis: Secondary | ICD-10-CM | POA: Diagnosis present

## 2017-05-12 HISTORY — DX: Gestational (pregnancy-induced) hypertension without significant proteinuria, unspecified trimester: O13.9

## 2017-05-12 LAB — CBC
HCT: 36.5 % (ref 36.0–46.0)
Hemoglobin: 13 g/dL (ref 12.0–15.0)
MCH: 31 pg (ref 26.0–34.0)
MCHC: 35.6 g/dL (ref 30.0–36.0)
MCV: 86.9 fL (ref 78.0–100.0)
PLATELETS: 252 10*3/uL (ref 150–400)
RBC: 4.2 MIL/uL (ref 3.87–5.11)
RDW: 14.2 % (ref 11.5–15.5)
WBC: 8.3 10*3/uL (ref 4.0–10.5)

## 2017-05-12 LAB — PROTEIN / CREATININE RATIO, URINE
Creatinine, Urine: 290 mg/dL
PROTEIN CREATININE RATIO: 0.17 mg/mg{creat} — AB (ref 0.00–0.15)
Total Protein, Urine: 48 mg/dL

## 2017-05-12 LAB — TYPE AND SCREEN
ABO/RH(D): A POS
ANTIBODY SCREEN: NEGATIVE

## 2017-05-12 LAB — RPR: RPR: NONREACTIVE

## 2017-05-12 MED ORDER — ACETAMINOPHEN 325 MG PO TABS
650.0000 mg | ORAL_TABLET | ORAL | Status: DC | PRN
Start: 2017-05-12 — End: 2017-05-13

## 2017-05-12 MED ORDER — LIDOCAINE HCL (PF) 1 % IJ SOLN
30.0000 mL | INTRAMUSCULAR | Status: AC | PRN
  Administered 2017-05-12: 30 mL via SUBCUTANEOUS
  Filled 2017-05-12: qty 30

## 2017-05-12 MED ORDER — LIDOCAINE HCL (PF) 1 % IJ SOLN
INTRAMUSCULAR | Status: DC | PRN
Start: 2017-05-12 — End: 2017-05-12
  Administered 2017-05-12: 7 mL via EPIDURAL
  Administered 2017-05-12: 4 mL via EPIDURAL

## 2017-05-12 MED ORDER — DIBUCAINE 1 % RE OINT: 1.0000 "application " | TOPICAL_OINTMENT | RECTAL | Status: DC | PRN

## 2017-05-12 MED ORDER — PRENATAL MULTIVITAMIN CH
1.0000 | ORAL_TABLET | Freq: Every day | ORAL | Status: DC
  Administered 2017-05-13 – 2017-05-14 (×2): 1 via ORAL
  Filled 2017-05-12 (×2): qty 1

## 2017-05-12 MED ORDER — OXYCODONE-ACETAMINOPHEN 5-325 MG PO TABS: 2.0000 | ORAL_TABLET | ORAL | Status: DC | PRN

## 2017-05-12 MED ORDER — WITCH HAZEL-GLYCERIN EX PADS
1.0000 "application " | MEDICATED_PAD | CUTANEOUS | Status: DC | PRN
  Administered 2017-05-12: 1 via TOPICAL

## 2017-05-12 MED ORDER — ZOLPIDEM TARTRATE 5 MG PO TABS: 5.0000 mg | ORAL_TABLET | Freq: Every evening | ORAL | Status: DC | PRN

## 2017-05-12 MED ORDER — FENTANYL CITRATE (PF) 100 MCG/2ML IJ SOLN
50.0000 ug | INTRAMUSCULAR | Status: DC | PRN
  Administered 2017-05-12: 50 ug via INTRAVENOUS

## 2017-05-12 MED ORDER — SIMETHICONE 80 MG PO CHEW: 80.0000 mg | CHEWABLE_TABLET | ORAL | Status: DC | PRN

## 2017-05-12 MED ORDER — SENNOSIDES-DOCUSATE SODIUM 8.6-50 MG PO TABS
2.0000 | ORAL_TABLET | ORAL | Status: DC
  Administered 2017-05-12 – 2017-05-13 (×2): 2 via ORAL
  Filled 2017-05-12 (×2): qty 2

## 2017-05-12 MED ORDER — BENZOCAINE-MENTHOL 20-0.5 % EX AERO
1.0000 "application " | INHALATION_SPRAY | CUTANEOUS | Status: DC | PRN
  Administered 2017-05-12: 1 via TOPICAL
  Filled 2017-05-12: qty 56

## 2017-05-12 MED ORDER — COCONUT OIL OIL
1.0000 "application " | TOPICAL_OIL | Status: DC | PRN
  Administered 2017-05-14: 1 via TOPICAL
  Filled 2017-05-12: qty 120

## 2017-05-12 MED ORDER — LACTATED RINGERS IV SOLN: 500.0000 mL | Freq: Once | INTRAVENOUS | Status: DC

## 2017-05-12 MED ORDER — PHENYLEPHRINE 40 MCG/ML (10ML) SYRINGE FOR IV PUSH (FOR BLOOD PRESSURE SUPPORT)
80.0000 ug | PREFILLED_SYRINGE | INTRAVENOUS | Status: DC | PRN
  Filled 2017-05-12: qty 10
  Filled 2017-05-12: qty 5

## 2017-05-12 MED ORDER — EPHEDRINE 5 MG/ML INJ
10.0000 mg | INTRAVENOUS | Status: DC | PRN
  Filled 2017-05-12: qty 2

## 2017-05-12 MED ORDER — FENTANYL CITRATE (PF) 100 MCG/2ML IJ SOLN
INTRAMUSCULAR | Status: AC
  Filled 2017-05-12: qty 2

## 2017-05-12 MED ORDER — LACTATED RINGERS IV SOLN
INTRAVENOUS | Status: DC
  Administered 2017-05-12: 02:00:00 via INTRAVENOUS

## 2017-05-12 MED ORDER — OXYTOCIN 40 UNITS IN LACTATED RINGERS INFUSION - SIMPLE MED
1.0000 m[IU]/min | INTRAVENOUS | Status: DC
  Administered 2017-05-12: 2 m[IU]/min via INTRAVENOUS
  Filled 2017-05-12: qty 1000

## 2017-05-12 MED ORDER — PHENYLEPHRINE 40 MCG/ML (10ML) SYRINGE FOR IV PUSH (FOR BLOOD PRESSURE SUPPORT)
80.0000 ug | PREFILLED_SYRINGE | INTRAVENOUS | Status: DC | PRN
  Filled 2017-05-12: qty 5

## 2017-05-12 MED ORDER — DIPHENHYDRAMINE HCL 25 MG PO CAPS: 25.0000 mg | ORAL_CAPSULE | Freq: Four times a day (QID) | ORAL | Status: DC | PRN

## 2017-05-12 MED ORDER — OXYCODONE HCL 5 MG PO TABS: 5.0000 mg | ORAL_TABLET | ORAL | Status: DC | PRN

## 2017-05-12 MED ORDER — ACETAMINOPHEN 325 MG PO TABS: 650.0000 mg | ORAL_TABLET | ORAL | Status: DC | PRN

## 2017-05-12 MED ORDER — ONDANSETRON HCL 4 MG/2ML IJ SOLN: 4.0000 mg | INTRAMUSCULAR | Status: DC | PRN

## 2017-05-12 MED ORDER — DIPHENHYDRAMINE HCL 50 MG/ML IJ SOLN: 12.5000 mg | INTRAMUSCULAR | Status: DC | PRN

## 2017-05-12 MED ORDER — OXYTOCIN 10 UNIT/ML IJ SOLN
10.0000 [IU] | Freq: Once | INTRAMUSCULAR | Status: DC | PRN
  Filled 2017-05-12: qty 1

## 2017-05-12 MED ORDER — TERBUTALINE SULFATE 1 MG/ML IJ SOLN
0.2500 mg | Freq: Once | INTRAMUSCULAR | Status: DC | PRN
  Filled 2017-05-12: qty 1

## 2017-05-12 MED ORDER — IBUPROFEN 600 MG PO TABS
600.0000 mg | ORAL_TABLET | Freq: Four times a day (QID) | ORAL | Status: DC
  Administered 2017-05-12 – 2017-05-14 (×7): 600 mg via ORAL
  Filled 2017-05-12 (×8): qty 1

## 2017-05-12 MED ORDER — LACTATED RINGERS IV SOLN: 500.0000 mL | INTRAVENOUS | Status: DC | PRN

## 2017-05-12 MED ORDER — OXYCODONE-ACETAMINOPHEN 5-325 MG PO TABS: 1.0000 | ORAL_TABLET | ORAL | Status: DC | PRN

## 2017-05-12 MED ORDER — FENTANYL 2.5 MCG/ML BUPIVACAINE 1/10 % EPIDURAL INFUSION (WH - ANES)
14.0000 mL/h | INTRAMUSCULAR | Status: DC | PRN
  Administered 2017-05-12: 14 mL/h via EPIDURAL
  Filled 2017-05-12: qty 100

## 2017-05-12 MED ORDER — ONDANSETRON HCL 4 MG PO TABS: 4.0000 mg | ORAL_TABLET | ORAL | Status: DC | PRN

## 2017-05-12 MED ORDER — TETANUS-DIPHTH-ACELL PERTUSSIS 5-2.5-18.5 LF-MCG/0.5 IM SUSP: 0.5000 mL | Freq: Once | INTRAMUSCULAR | Status: DC

## 2017-05-12 MED ORDER — OXYTOCIN BOLUS FROM INFUSION
500.0000 mL | Freq: Once | INTRAVENOUS | Status: AC
  Administered 2017-05-12: 500 mL via INTRAVENOUS

## 2017-05-12 MED ORDER — OXYTOCIN 40 UNITS IN LACTATED RINGERS INFUSION - SIMPLE MED: 2.5000 [IU]/h | INTRAVENOUS | Status: DC

## 2017-05-12 MED ORDER — SOD CITRATE-CITRIC ACID 500-334 MG/5ML PO SOLN: 30.0000 mL | ORAL | Status: DC | PRN

## 2017-05-12 NOTE — Anesthesia Pain Management Evaluation Note (Signed)
  CRNA Pain Management Visit Note  Patient: Lisa Liu, 21 y.o., female  "Hello I am a member of the anesthesia team at [facility]. We have an anesthesia team available at all times to provide care throughout the hospital, including epidural management and anesthesia for C-section. I don't know your plan for the delivery whether it a natural birth, water birth, IV sedation, nitrous supplementation, doula or epidural, but we want to meet your pain goals."   1.Was your pain managed to your expectations on prior hospitalizations?   No prior hospitalizations  2.What is your expectation for pain management during this hospitalization?     Epidural and IV pain meds  3.How can we help you reach that goal? epidural  Record the patient's initial score and the patient's pain goal.   Pain: 8  Pain Goal: 4 [facility] wants you to be able to say your pain was always managed very well.  April Carlyon 05/12/2017

## 2017-05-12 NOTE — Lactation Note (Signed)
This note was copied from a baby's chart. Lactation Consultation Note  Patient Name: Lisa Liu [id number] Date: 05/12/2017 Reason for consult: Initial assessment;Primapara;1st time breastfeeding;Early term 69-38.6wks  Baby born at [redacted]w[redacted]d, induction for GHTN.  Baby lying STS.  Assisted with hand expression and breast massage.  Colostrum drop expressed.  Mom wincing with hand expression.  Encouraged Mom to perform it herself often.  Showed her how to spoon feed baby.  Baby able to latch onto breast deeply while sandwiching breast, but after 15 seconds of sucking, fell asleep.  Mom has short nipple shafts.  Demonstrated how to pre-pump using the hand pump, and apply the 20 mm nipple shield.  Baby too sleepy to latch at this point.  Reassured Mom.  Encouraged hand expression to a spoon until baby is able to attain a deep areolar latch.   Left baby STS on Mom's chest.  Encouraged Mom to call when baby begins cueing, for assist with latching.   Brochure left in room.  Mom aware of lactation support available to her.     Lisa Liu 05/12/2017, 8:49 PM

## 2017-05-12 NOTE — Anesthesia Preprocedure Evaluation (Signed)
Anesthesia Evaluation  Patient identified by MRN, date of birth, ID band Patient awake    Reviewed: Allergy & Precautions, NPO status , Patient's Chart, lab work & pertinent test results  Airway Mallampati: II  TM Distance: >3 FB Neck ROM: Full    Dental  (+) Dental Advisory Given   Pulmonary neg pulmonary ROS,    Pulmonary exam normal breath sounds clear to auscultation       Cardiovascular hypertension, Normal cardiovascular exam Rhythm:Regular Rate:Normal  Gestational HTN   Neuro/Psych negative neurological ROS  negative psych ROS   GI/Hepatic negative GI ROS, Neg liver ROS,   Endo/Other  negative endocrine ROS  Renal/GU negative Renal ROS  negative genitourinary   Musculoskeletal negative musculoskeletal ROS (+)   Abdominal   Peds  Hematology negative hematology ROS (+)   Anesthesia Other Findings   Reproductive/Obstetrics (+) Pregnancy Fibroids                             Anesthesia Physical Anesthesia Plan  ASA: II  Anesthesia Plan: Epidural   Post-op Pain Management:    Induction:   PONV Risk Score and Plan:   Airway Management Planned: Natural Airway  Additional Equipment:   Intra-op Plan:   Post-operative Plan:   Informed Consent: I have reviewed the patients History and Physical, chart, labs and discussed the procedure including the risks, benefits and alternatives for the proposed anesthesia with the patient or authorized representative who has indicated his/her understanding and acceptance.     Plan Discussed with:   Anesthesia Plan Comments: (Labs reviewed. Platelets acceptable, patient not taking any blood thinning medications. Risks and benefits discussed with patient, patient expressed understanding and wished to proceed.)        Anesthesia Quick Evaluation

## 2017-05-12 NOTE — Progress Notes (Signed)
Lisa Liu is a 21 y.o. G2P0010 at [redacted]w[redacted]d admitted for induction of labor due to gestational Hypertension.  Subjective:  Pt feeling comfortable with epidural.  Denies any HA/N/V/vision changes.    Objective: BP 125/81   Pulse 72   Temp 98 F (36.7 C) (Oral)   Resp 16   Ht 5\' 5"  (1.651 m)   Wt 70.3 kg (155 lb)   LMP 08/18/2016   SpO2 99%   BMI 25.79 kg/m  No intake/output data recorded. No intake/output data recorded.  FHT:  FHR: 120s bpm, variability: minimal-moderate long term  ,  accelerations:  Present,  decelerations:  Present mild variables UC:   regular, every 3-4 minutes, Pit @ 10 mu./min SVE:   Dilation: 6 Effacement (%): 100 Station: 0 Exam by:: Aron Baba  SROM @ 0809  Labs: Lab Results  Component Value Date   WBC 8.3 05/12/2017   HGB 13.0 05/12/2017   HCT 36.5 05/12/2017   MCV 86.9 05/12/2017   PLT 252 05/12/2017    Assessment / Plan: Induction of labor due to gestational hypertension,  progressing well on pitocin  Labor: Progressing on Pitocin, will continue to increase  Fetal Wellbeing:  Category I Pain Control:  Epidural I/D:  n/a Anticipated MOD:  NSVD  Quadry Kampa Bowmaker-Kareen, SNM 05/12/2017, 9:39 AM

## 2017-05-12 NOTE — H&P (Signed)
LABOR AND DELIVERY ADMISSION HISTORY AND PHYSICAL NOTE  Lisa Liu is a 21 y.o. female G2P0010 with IUP at [redacted]w[redacted]d by LMP presenting for gHTN with diatstolic consistently over 65. She has no history of HTN and she states her blood pressure prior to this visit has been well controlled without medications both before and during her pregnancy. She reports positive fetal movement. She denies leakage of fluid or vaginal bleeding.  She denies fever, chills, headache, blurry vision, CP, SOB, abdominal pain, pain or burning during urination, calf pain or tenderness.  She has had nausea throughout her pregnancy, mild constipation, and GERD.  Prenatal History/Complications: PNC at [facility] Pregnancy complications:  - gHTN  Past Medical History: Past Medical History:  Diagnosis Date  . Fibroids     Past Surgical History: History reviewed. No pertinent surgical history.  Obstetrical History: OB History    Gravida Para Term Preterm AB Living   2 0     1 0   SAB TAB Ectopic Multiple Live Births   1              Social History: Social History   Social History  . Marital status: Married    Spouse name: N/A  . Number of children: N/A  . Years of education: N/A   Social History Main Topics  . Smoking status: Never Smoker  . Smokeless tobacco: Never Used  . Alcohol use No  . Drug use: No  . Sexual activity: Yes    Birth control/ protection: None   Other Topics Concern  . None   Social History Narrative  . None    Family History: Family History  Problem Relation Age of Onset  . Cancer Father   . Varicose Veins Father   . Diabetes Paternal Grandmother   . Hyperlipidemia Paternal Grandmother     Allergies: No Known Allergies  Prescriptions Prior to Admission  Medication Sig Dispense Refill Last Dose  . omeprazole (PRILOSEC) 20 MG capsule Take 1 capsule (20 mg total) by mouth daily. 30 capsule 5 05/11/2017 at Unknown time  . Prenatal Vit-Fe  Fumarate-FA (PRENATAL MULTIVITAMIN) TABS tablet Take 1 tablet by mouth at bedtime.   05/11/2017 at Unknown time  . cephALEXin (KEFLEX) 500 MG capsule Take 1 capsule (500 mg total) by mouth 4 (four) times daily. (Patient not taking: Reported on 05/08/2017) 28 capsule 0 Not Taking  . docusate sodium (COLACE) 100 MG capsule Take 1 capsule (100 mg total) by mouth 2 (two) times daily as needed for mild constipation. 10 capsule 0 Taking  . ondansetron (ZOFRAN) 8 MG tablet Take 1 tablet (8 mg total) by mouth every 8 (eight) hours as needed for nausea or vomiting. (Patient not taking: Reported on 05/08/2017) 25 tablet 0 Not Taking  . potassium chloride (KLOR-CON M15) 15 MEQ tablet Take 2 tablets (30 mEq total) by mouth 2 (two) times daily before a meal. 6 tablet 0 Taking     Review of Systems  All systems reviewed and negative except as stated in HPI  Physical Exam Blood pressure (!) 137/99, pulse 77, temperature 98.3 F (36.8 C), temperature source Oral, resp. rate 16, height 5\' 5"  (1.651 m), weight 155 lb (70.3 kg), last menstrual period 08/18/2016, SpO2 99 %, unknown if currently breastfeeding. General appearance: alert, cooperative and no distress Lungs: clear to auscultation bilaterally Heart: regular rate and rhythm Abdomen: soft, non-tender; bowel sounds normal Extremities: No calf swelling or tenderness Presentation: vertex Fetal monitoring: 125bmp, moderate variability,  accelerations present, decelerations absent Uterine activity: irregular contractions, 8 minutes apart Dilation: 4 Effacement (%): 70 Station: -3 Exam by:: Maryagnes Amos RN   Prenatal labs: ABO, Rh: --/--/A POS, A POS (09/08 0205) Antibody: NEG (09/08 0205) Rubella: >33.00 (04/06 0945) RPR: Non Reactive (07/31 0859)  HBsAg: Negative (09/08 0205)  HIV: NONREACTIVE (11/27 0951)  GC/Chlamydia: negative GBS: Negative (09/27 1135)  1 hr Glucola: normal 2 hr GTT Genetic screening:  WNL Anatomy US: normal  Prenatal  Transfer Tool  Maternal Diabetes: No Genetic Screening: Normal Maternal Ultrasounds/Referrals: Normal, unchanged uterine fibroids Fetal Ultrasounds or other Referrals:  None Maternal Substance Abuse:  No Significant Maternal Medications:  Meds include: Other: Prilosec Significant Maternal Lab Results: None  Results for orders placed or performed during the hospital encounter of 05/11/17 (from the past 24 hour(s))  Protein / creatinine ratio, urine   Collection Time: 05/11/17 10:10 PM  Result Value Ref Range   Creatinine, Urine 290.00 mg/dL   Total Protein, Urine 48 mg/dL   Protein Creatinine Ratio 0.17 (H) 0.00 - 0.15 mg/mg[Cre]  CBC   Collection Time: 05/11/17 11:15 PM  Result Value Ref Range   WBC 4.7 4.0 - 10.5 K/uL   RBC 3.89 3.87 - 5.11 MIL/uL   Hemoglobin 11.9 (L) 12.0 - 15.0 g/dL   HCT 33.9 (L) 36.0 - 46.0 %   MCV 87.1 78.0 - 100.0 fL   MCH 30.6 26.0 - 34.0 pg   MCHC 35.1 30.0 - 36.0 g/dL   RDW 14.2 11.5 - 15.5 %   Platelets 227 150 - 400 K/uL  Comprehensive metabolic panel   Collection Time: 05/11/17 11:15 PM  Result Value Ref Range   Sodium 136 135 - 145 mmol/L   Potassium 3.6 3.5 - 5.1 mmol/L   Chloride 105 101 - 111 mmol/L   CO2 20 (L) 22 - 32 mmol/L   Glucose, Bld 93 65 - 99 mg/dL   BUN 7 6 - 20 mg/dL   Creatinine, Ser 0.79 0.44 - 1.00 mg/dL   Calcium 8.7 (L) 8.9 - 10.3 mg/dL   Total Protein 6.3 (L) 6.5 - 8.1 g/dL   Albumin 2.6 (L) 3.5 - 5.0 g/dL   AST 32 15 - 41 U/L   ALT 34 14 - 54 U/L   Alkaline Phosphatase 192 (H) 38 - 126 U/L   Total Bilirubin 0.8 0.3 - 1.2 mg/dL   GFR calc non Af Amer >60 >60 mL/min   GFR calc Af Amer >60 >60 mL/min   Anion gap 11 5 - 15    Patient Active Problem List   Diagnosis Date Noted  . Gestational hypertension 05/12/2017  . Gestational hypertension w/o significant proteinuria in 3rd trimester 05/12/2017  . Hypokalemia 04/12/2017  . Liver dysfunction 04/11/2017  . Insufficient weight gain during pregnancy 04/02/2017  .  Supervision of normal pregnancy in third trimester 11/07/2016  . Uterine fibroids affecting pregnancy, antepartum 10/15/2016  . Nausea and vomiting of pregnancy, antepartum 10/15/2016  . Fibroid uterus 07/18/2016    Assessment: Lisa Liu is a 21 y.o. G2P0010 at [redacted]w[redacted]d here for IOL due to gHTN. She endorses no symptoms concerning for preE and her protein to Cr is 0.17.   #Labor: IOL with Pit for gHTN #Pain: Well controlled, may have epidural #FWB:  Cat I  #ID:  n/a #MOF: breast and bottle #MOC: none at this time #Circ:  N/a (girl)  Nuala Alpha 05/12/2017, 2:32 AM  OB FELLOW HISTORY AND PHYSICAL ATTESTATION I have seen and  examined this patient; I agree with above documentation in the resident's note.    Gailen Shelter, MD OB Fellow 05/12/2017, 3:53 AM

## 2017-05-12 NOTE — Anesthesia Procedure Notes (Signed)
Epidural Patient location during procedure: OB Start time: 05/12/2017 8:49 AM End time: 05/12/2017 8:56 AM  Staffing Anesthesiologist: Renold Don E Performed: anesthesiologist   Preanesthetic Checklist Completed: patient identified, pre-op evaluation, timeout performed, IV checked, risks and benefits discussed and monitors and equipment checked  Epidural Patient position: sitting Prep: DuraPrep Patient monitoring: continuous pulse ox and blood pressure Approach: midline Location: L3-L4 Injection technique: LOR saline  Needle:  Needle type: Tuohy  Needle gauge: 17 G Needle length: 9 cm Needle insertion depth: 6 cm Catheter size: 19 Gauge Catheter at skin depth: 13 cm Test dose: negative and Other (1% lidocaine)  Additional Notes Patient identified. Risks including, but not limited to, bleeding, infection, nerve damage, paralysis, inadequate analgesia, blood pressure changes, nausea, vomiting, allergic reaction, postpartum back pain, itching, and headache were discussed. Patient expressed understanding and wished to proceed. Sterile prep and drape, including hand hygiene, mask, and sterile gloves were used. The patient was positioned and the spine was prepped. The skin was anesthetized with lidocaine. No paraesthesia or other complication noted. The patient did not experience any signs of intravascular injection such as tinnitus or metallic taste in mouth, nor signs of intrathecal spread such as rapid motor block. Please see nursing notes for vital signs. The patient tolerated the procedure well.   Renold Don, MDReason for block:procedure for pain

## 2017-05-12 NOTE — Progress Notes (Signed)
LABOR PROGRESS NOTE  Tyechia Allmendinger is a 21 y.o. G2P0010 at [redacted]w[redacted]d  admitted for IOL for GHTN  Subjective: Doing well. Denies any concerns. Denies HA, visual disturbances, SOB, RUQ pain or epigastric pain.  Objective: BP 133/86   Pulse 74   Temp 98.3 F (36.8 C) (Oral)   Resp 16   Ht 5\' 5"  (1.651 m)   Wt 155 lb (70.3 kg)   LMP 08/18/2016   SpO2 99%   BMI 25.79 kg/m  or  Vitals:   05/12/17 0501 05/12/17 0531 05/12/17 0601 05/12/17 0631  BP: (!) 132/99 122/63 125/81 133/86  Pulse: 68 68 70 74  Resp:      Temp:      TempSrc:      SpO2:      Weight:      Height:        Last SVE @06 :07: Dilation: 4 Effacement (%): 80 Cervical Position: Middle Station: -3 Presentation: Vertex Exam by:: Sade Harper,RN FHT: baseline rate 120, moderate varibility, +acel, no decel Toco: irregular ctx  Assessment / Plan: 21 y.o. G2P0010 at [redacted]w[redacted]d here for IOL for GHTN. BP mild range. [place] labs wnl.  Labor: Continue to titrate IV Pit, currently at 8 mU/min Fetal Wellbeing:  Cat I ID: [id number] Anticipated MOD:  SVD  Gailen Shelter, MD 05/12/2017, 7:04 AM

## 2017-05-13 ENCOUNTER — Telehealth: Payer: Self-pay | Admitting: Radiology

## 2017-05-13 NOTE — Lactation Note (Signed)
This note was copied from a baby's chart. Lactation Consultation Note  Patient Name: Girl Jamica Woodyard LOVFI'E Date: 05/13/2017 Reason for consult: Follow-up assessment  Follow up visit at 24 hours of age.  RN requests assist as baby has not had good feedings.  LC assisted with hand expressed of several drops on spoon and applied to baby's mouth.  Baby is showing feeding cues with hands to mouth and very passive about sucking.  LC assisted with cross cradle hold with and without NS, baby didn't maintain suck. [place] laid mom back and helped baby lay prone on mom and baby began to show more interest in feeding and can self latch, but doesn't maintain latch.  LC assisted with application of NS and latching, baby then moved to cross cradle hold as baby was noted to roll to side and appeared more comfortable in that position.  Baby sucked for several minutes and then would not continue.  Mom set up with DEBP and encourage to post pump 8x daily to increase stimulation.  Mom to work on hand expression after pumping to offer spoon feeding.   Plan is for mom to pre pump as needed and spoon feed before and after feeding attempts to allow baby easy calories. Mom reports improved feeding at this attempt and baby is not 24 hours.  Discussed cluster feeding and waking baby as needed for feedings.  Mom to call as needed.     Maternal Data    Feeding Feeding Type: Breast Fed Length of feed:  (several minutes of sucking)  LATCH Score Latch: Repeated attempts needed to sustain latch, nipple held in mouth throughout feeding, stimulation needed to elicit sucking reflex.  Audible Swallowing: A few with stimulation  Type of Nipple: Flat  Comfort (Breast/Nipple): Soft / non-tender (right breast sensitive)  Hold (Positioning): Assistance needed to correctly position infant at breast and maintain latch.  LATCH Score: 6  Interventions Interventions: Breast feeding basics reviewed;Assisted with latch;Skin  to skin;Breast massage;Hand express;Pre-pump if needed;Breast compression;Shells;Expressed milk;Position options;Support pillows;Adjust position;DEBP  Lactation Tools Discussed/Used Tools: Nipple Shields Nipple shield size: 20 Breast pump type: Double-Electric Breast Pump   Consult Status Consult Status: Follow-up Date: 05/14/17 Follow-up type: In-patient    Rosangela Fehrenbach 05/13/2017, 1:26 PM

## 2017-05-13 NOTE — Plan of Care (Signed)
Problem: Nutritional: Goal: Mothers verbalization of comfort with breastfeeding process will improve Encouraged patient to call for breast feeding assistance. Patient is wearing shells and when removing shells, her nipples are erect.

## 2017-05-13 NOTE — Anesthesia Postprocedure Evaluation (Signed)
Anesthesia Post Note  Patient: Syvilla Ornela Vandermeer  Procedure(s) Performed: AN AD Ward     Patient location during evaluation: Mother Baby Anesthesia Type: Epidural Level of consciousness: awake, awake and alert and oriented Pain management: pain level controlled Vital Signs Assessment: post-procedure vital signs reviewed and stable Respiratory status: spontaneous breathing, nonlabored ventilation and respiratory function stable Cardiovascular status: stable Postop Assessment: no headache, no backache, patient able to bend at knees, no apparent nausea or vomiting and adequate PO intake Anesthetic complications: no    Last Vitals:  Vitals:   05/12/17 2047 05/13/17 0617  BP: 126/81 131/88  Pulse: 85 67  Resp: 18 18  Temp: 36.7 C 36.8 C  SpO2:      Last Pain:  Vitals:   05/13/17 0617  TempSrc: Oral  PainSc: 0-No pain   Pain Goal: Patients Stated Pain Goal: 3 (05/12/17 1510)               Bufford Spikes

## 2017-05-13 NOTE — Lactation Note (Signed)
This note was copied from a baby's chart. Lactation Consultation Note Mom unable to get baby to BF. Baby cueing some, but will not latch. Mom was given #20 NS by LC d/t nipples compress flat w/inverted center of nipple. Nipples everted w/short shaft, not able to obtain a deep latch at this time. Baby bites, has no suck coordination. W/gloved finger suck training attempted w/o success. Baby chewed a few times on finger, acting as if not knowing what to do. Wouldn't extend tongue under finger. Did note baby extended tongue out past lips once, w/slight heart shape tongue.  Attempted to latch w/#20 NS. Reviewed application. Taught "C" hold when latching. Encouraged football hold to obtain deep latch until baby starts feeding well.  Baby didn't want to suck, started crying. Encouraged mom to talk to baby. It soothed baby, she would hold onto breast w/NS but no suckle.  Hand expressed 0.57ml thick colostrum, spoon fed baby. Baby sleepy. After STS for over hour, swaddled baby and encouraged mom to rest while baby is resting. Try again in 2-3 hours unless baby cueing.  Several times baby gagged trying to spit up but didn't. Baby off breast while doing this. Noted baby abd. Slightly distended. Discussed newborn behavior. Encouraged mom to pre-pump breast to evert nipples. Mom wanting to BF w/o NS.  Shells given to wear to assist in everting nipples for latching. Discussed nipple and transferring milk. Encouraged STS as much as possible, as well as hand expression giving colostrum w/spoon to encouraged to feed.  Patient Name: Girl Anwar Crill QIWLN'L Date: 05/13/2017 Reason for consult: Follow-up assessment;Difficult latch;Mother's request   Maternal Data    Feeding Feeding Type: Breast Milk Length of feed: 0 min  LATCH Score Latch: Too sleepy or reluctant, no latch achieved, no sucking elicited.  Audible Swallowing: None  Type of Nipple: Flat  Comfort (Breast/Nipple): Soft /  non-tender  Hold (Positioning): Full assist, staff holds infant at breast  LATCH Score: 3  Interventions Interventions: Breast feeding basics reviewed;Breast compression;Assisted with latch;Adjust position;Hand pump;Skin to skin;Support pillows;Breast massage;Position options;Hand express;Expressed milk;Pre-pump if needed;Shells  Lactation Tools Discussed/Used Tools: Shells;Pump;Nipple Shields Nipple shield size: 20 Shell Type: Inverted Breast pump type: Manual   Consult Status Consult Status: Follow-up Date: 05/13/17 Follow-up type: In-patient    Gianina Olinde, Elta Guadeloupe 05/13/2017, 1:22 AM

## 2017-05-13 NOTE — Progress Notes (Signed)
POSTPARTUM PROGRESS NOTE  Post Partum Day 1 Subjective:  Lisa Liu is a 21 y.o. G2P1011 [redacted]w[redacted]d s/p SVD.  No acute events overnight.  Pt denies problems with ambulating, voiding or po intake.  She denies nausea or vomiting.  Pain is well controlled.  She has had flatus. She has not had bowel movement.  Lochia Minimal.   Objective: Blood pressure 131/88, pulse 67, temperature 98.2 F (36.8 C), temperature source Oral, resp. rate 18, height 5\' 5"  (1.651 m), weight 70.3 kg (155 lb), last menstrual period 08/18/2016, SpO2 99 %, unknown if currently breastfeeding.  Physical Exam:  General: alert, cooperative and no distress Lochia:normal flow Chest: CTAB Heart: RRR no m/r/g Abdomen: +BS, soft, nontender,  Uterine Fundus: firm DVT Evaluation: No calf swelling or tenderness Extremities: no edema   Recent Labs  05/11/17 2315 05/12/17 0813  HGB 11.9* 13.0  HCT 33.9* 36.5    Assessment/Plan:  ASSESSMENT: Lisa Liu is a 21 y.o. G2P1011 [redacted]w[redacted]d s/p SVD. Mom would like to stay today and work on breastfeeding. Everything else is going well.   Plan for discharge tomorrow   LOS: 1 day   Eloise Levels, MD PGY-2 Center for [facility]  05/13/2017, 9:26 AM

## 2017-05-13 NOTE — Telephone Encounter (Signed)
Left message on cell phone to call [facility] to scheduled PP- BP check in a week.

## 2017-05-14 MED ORDER — IBUPROFEN 600 MG PO TABS: 600.0000 mg | ORAL_TABLET | Freq: Three times a day (TID) | ORAL | 2 refills | Status: DC | PRN

## 2017-05-14 NOTE — Lactation Note (Addendum)
This note was copied from a baby's chart. Lactation Consultation Note  Patient Name: Lisa Liu SELTR'V Date: 05/14/2017 Reason for consult: Follow-up assessment;MD order;Difficult latch  LC visit at 7 hours of age.  LC received phone call from T/S Dr. Earvin Hansen for consult.   Mom 1st reports good feedings and denies concerns.  Mom reports using NS some feedings and not others.  When asked if mom has nipple pain she reports "6" on scale of 1-10.  Mom then reports she is not having much pain and unsure of number.  Mom appears to have intact nipples.  Mom reports seeing colostrum in NS with feedings.  Mom reports last feeding at 7am and will call [name] for next latch.  LC encouraged mom to use DEBP and hand express to help increase supply.  LC returned later to find mom pumping with DEBP no colostrum collected at this time.  [facility] staff at bedside to set mom up with formula vouchers.  LC instructed mom on use of hand held piston for double pumping with kit for home use. Mom reports she wants to keep breastfeeding.  Mom can apply #20 NS and holds baby cradle hold to latch with narrow gape and baby pulling off with base of NS visible.   LC assisted with latching baby STS in cross cradle hold. LC showed mom to wait for wide open mouth and help baby place lower lip under NS on areola not the base of the nipple shield.  Baby sucks a few times and stops.  LC explained to mom this is not active feeding, baby should have strong jaw bursts with swallows audible.   Mom then reports recent bottle feeding of about 2ms about 1 1/2 hours previous.   LC encouraged mom to call when baby is showing feeding cues.  LC discussed need to increase volume in supplement if baby is not noted to transfer well with NS.  LC advised parents will need to schedule o/p Lactation consult prior to discharge for weighted feeding assessment with use of NS. Parents agreeable to services.    Maternal Data Has patient been  taught Hand Expression?: Yes  Feeding Feeding Type: Breast Fed Length of feed:  (few sucks no swallows)  LATCH Score Latch: Repeated attempts needed to sustain latch, nipple held in mouth throughout feeding, stimulation needed to elicit sucking reflex.  Audible Swallowing: None  Type of Nipple: Everted at rest and after stimulation  Comfort (Breast/Nipple): Soft / non-tender  Hold (Positioning): Assistance needed to correctly position infant at breast and maintain latch.  LATCH Score: 6  Interventions Interventions: Breast feeding basics reviewed;Assisted with latch;Skin to skin;Breast massage;Hand express;Pre-pump if needed;Breast compression;Shells;Expressed milk;Position options;Support pillows;Adjust position;DEBP  Lactation Tools Discussed/Used Nipple shield size: 20 Breast pump type: Double-Electric Breast Pump Pump Review: Setup, frequency, and cleaning   Consult Status Consult Status: Follow-up Date: 05/14/17    JMalena Edman10/06/2017, 9:56 AM

## 2017-05-14 NOTE — Discharge Instructions (Signed)

## 2017-05-14 NOTE — Discharge Summary (Signed)
OB Discharge Summary     Patient Name: Lisa Liu DOB: 07/02/1996 MRN: [id number]  Date of admission: 05/11/2017 Delivering MD: Luretha Murphy   Date of discharge: 05/14/2017  Admitting diagnosis: 41WKS CTX DECREASED FETAL MOVEMENT Intrauterine pregnancy: [redacted]w[redacted]d     Secondary diagnosis:  Principal Problem:   Gestational hypertension  Additional problems: Uterine fibroids affecting pregnancy, insufficient weight gain during pregnancy, liver dysfunction     Discharge diagnosis: Term Pregnancy Delivered                                                                                                Post partum procedures:none  Augmentation: Pitocin  Complications: None  Hospital course:  Induction of Labor With Vaginal Delivery   21 y.o. yo G2P1011 at [redacted]w[redacted]d was admitted to the hospital 05/11/2017 for induction of labor.  Indication for induction: Gestational hypertension.  Patient had an uncomplicated labor course as follows: Membrane Rupture Time/Date: 8:07 AM ,05/12/2017   Intrapartum Procedures: Episiotomy: None [1]                                         Lacerations:  2nd degree [3];Perineal [11]  Patient had delivery of a Viable infant.  Information for the patient's newborn:  Anaissa, Macfadden [[id number]]  Delivery Method: Vaginal, Spontaneous Delivery (Filed from Delivery Summary)   05/12/2017  Details of delivery can be found in separate delivery note.  Patient had a routine postpartum course. Patient is discharged home 05/14/17.  Physical exam  Vitals:   05/12/17 2047 05/13/17 0617 05/13/17 1853 05/14/17 0500  BP: 126/81 131/88 123/73 129/88  Pulse: 85 67 86 97  Resp: 18 18 18 18   Temp: 98.1 F (36.7 C) 98.2 F (36.8 C) 98.1 F (36.7 C) 98.3 F (36.8 C)  TempSrc: Oral Oral Oral Oral  SpO2:      Weight:      Height:       General: alert, cooperative and no distress Lochia: appropriate Uterine Fundus: firm Incision: N/A DVT  Evaluation: No evidence of DVT seen on physical exam. No significant calf/ankle edema. Labs: Lab Results  Component Value Date   WBC 8.3 05/12/2017   HGB 13.0 05/12/2017   HCT 36.5 05/12/2017   MCV 86.9 05/12/2017   PLT 252 05/12/2017   CMP Latest Ref Rng & Units 05/11/2017  Glucose 65 - 99 mg/dL 93  BUN 6 - 20 mg/dL 7  Creatinine 0.44 - 1.00 mg/dL 0.79  Sodium 135 - 145 mmol/L 136  Potassium 3.5 - 5.1 mmol/L 3.6  Chloride 101 - 111 mmol/L 105  CO2 22 - 32 mmol/L 20(L)  Calcium 8.9 - 10.3 mg/dL 8.7(L)  Total Protein 6.5 - 8.1 g/dL 6.3(L)  Total Bilirubin 0.3 - 1.2 mg/dL 0.8  Alkaline Phos 38 - 126 U/L 192(H)  AST 15 - 41 U/L 32  ALT 14 - 54 U/L 34    Discharge instruction: per After Visit Summary and "Baby and Me Booklet".  After visit meds:  Allergies as of 05/14/2017   No Known Allergies     Medication List    STOP taking these medications   cephALEXin 500 MG capsule Commonly known as:  KEFLEX   omeprazole 20 MG capsule Commonly known as:  PRILOSEC   ondansetron 8 MG tablet Commonly known as:  ZOFRAN     TAKE these medications   docusate sodium 100 MG capsule Commonly known as:  COLACE Take 1 capsule (100 mg total) by mouth 2 (two) times daily as needed for mild constipation.   ibuprofen 600 MG tablet Commonly known as:  ADVIL,MOTRIN Take 1 tablet (600 mg total) by mouth every 8 (eight) hours as needed for mild pain or moderate pain.   potassium chloride SA 15 MEQ tablet Commonly known as:  KLOR-CON M15 Take 2 tablets (30 mEq total) by mouth 2 (two) times daily before a meal.   prenatal multivitamin Tabs tablet Take 1 tablet by mouth at bedtime.       Diet: routine diet  Activity: Advance as tolerated. Pelvic rest for 6 weeks.   Outpatient follow up:6 weeks Follow up Appt:Future Appointments Date Time Provider [place]  06/09/2017 8:45 AM Emily Filbert, MD [facility]   Follow up Visit:No Follow-up on file.  Postpartum  contraception: None  Newborn Data: Live born female  Birth Weight: 6 lb 11.1 oz (3036 g) APGAR: 9, 9  Newborn Delivery   Birth date/time:  05/12/2017 13:03:00 Delivery type:  Vaginal, Spontaneous Delivery      Baby Feeding: Bottle and Breast Disposition:home with mother   05/14/2017 Nuala Alpha, DO

## 2017-05-15 ENCOUNTER — Encounter: Payer: Self-pay | Admitting: Obstetrics and Gynecology

## 2017-05-21 ENCOUNTER — Ambulatory Visit: Payer: Self-pay

## 2017-05-21 VITALS — BP 131/82 | HR 114

## 2017-05-21 DIAGNOSIS — Z0130 Encounter for examination of blood pressure without abnormal findings: Secondary | ICD-10-CM

## 2017-05-21 NOTE — Progress Notes (Signed)
Patient seen and assessed by nursing staff.  Agree with documentation and plan.  

## 2017-05-21 NOTE — Progress Notes (Signed)
Patient presented to the office today for blood pressure check.  Patient blood pressure is 131/82 patient reports no signs of visual disturbance,headache or dizziness at this time. Patient stated her vagina is sore and wanted to know if a provider could check her out. I have advised her that there are no provider available to see her today. Patient will call back tomorrow to see if she can be work in. I have also advised her to take tyenlol to see if she gets some relief for discomfort. Patient verbalizes understanding at this time.

## 2017-06-09 ENCOUNTER — Ambulatory Visit: Payer: Self-pay | Admitting: Obstetrics & Gynecology

## 2017-06-12 ENCOUNTER — Encounter: Payer: Self-pay | Admitting: Obstetrics and Gynecology

## 2017-06-12 ENCOUNTER — Ambulatory Visit (INDEPENDENT_AMBULATORY_CARE_PROVIDER_SITE_OTHER): Payer: Self-pay | Admitting: Obstetrics and Gynecology

## 2017-06-12 NOTE — Progress Notes (Signed)
Obstetrics Visit Postpartum Visit  Appointment Date: 06/12/2017  OBGYN Clinic: [facility]  Primary Care Provider: No primary care provider on file.  Chief Complaint:  Chief Complaint  Patient presents with  . Postpartum Care    History of Present Illness: Lisa Liu is a 21 y.o. African-American G2P1011 (No LMP recorded.), seen for the above chief complaint.   She is s/p 10/9 on SVD/2nd degree; she was discharged to home on PPD#2  Vaginal bleeding or discharge: No  Breast or formula feeding: both Intercourse: No  Contraception after delivery: No  PP depression s/s: No  Any bowel or bladder issues: No  Pap smear: no abnormalities (date: 2018)  Review of Systems: as noted in the History of Present Illness.  Medications Lisa Liu had no medications administered during this visit. Current Outpatient Medications  Medication Sig Dispense Refill  . Prenatal Vit-Fe Fumarate-FA (PRENATAL MULTIVITAMIN) TABS tablet Take 1 tablet by mouth at bedtime.    . docusate sodium (COLACE) 100 MG capsule Take 1 capsule (100 mg total) by mouth 2 (two) times daily as needed for mild constipation. (Patient not taking: Reported on 06/12/2017) 10 capsule 0  . ibuprofen (ADVIL,MOTRIN) 600 MG tablet Take 1 tablet (600 mg total) by mouth every 8 (eight) hours as needed for mild pain or moderate pain. (Patient not taking: Reported on 06/12/2017) 30 tablet 2  . potassium chloride (KLOR-CON M15) 15 MEQ tablet Take 2 tablets (30 mEq total) by mouth 2 (two) times daily before a meal. (Patient not taking: Reported on 06/12/2017) 6 tablet 0   No current facility-administered medications for this visit.     Allergies Patient has no known allergies.  Physical Exam:  There were no vitals taken for this visit. There is no height or weight on file to calculate BMI. General appearance: Well nourished, well developed female in no acute distress.  Cardiovascular:  normal s1 and s2.  No murmurs, rubs or gallops. Respiratory:  Clear to auscultation bilateral. Normal respiratory effort Abdomen: umbilical hernia, approx 1.5-2cm x 1.5-2cm, nttp Neuro/Psych:  Normal mood and affect.  Skin:  Warm and dry.  Lymphatic:  No inguinal lymphadenopathy.   Pelvic exam: is not limited by body habitus EGBUS: within normal limits with slight skin bridge at the posterior fourchette.  Vagina: within normal limits and with no blood in the vault, Cervix:  no lesions or cervical motion tenderness Uterus:  enlarged, c/w 16 week size, nttp, mobile. Fundus or fibroid felt off the right below the umbilicus Adnexa:  normal adnexa and no mass, fullness, tenderness Rectovaginal: deferred  Laboratory: neg  PP Depression Screening:  3  Assessment: pt doing well  Plan:  Pt declines any BC. D/w her that once she stops breastfeeding that she should expect a period sometime in the next month. Fibroids c/w prior non pregnancy u/s and CT scan. Pt also told to let us know if has any discomfort with intercourse and likely can open up skin bridge in office.   RTC PRN  Durene Romans MD Attending Center for [facility] [employer])

## 2017-07-20 ENCOUNTER — Ambulatory Visit: Payer: Self-pay | Admitting: Obstetrics and Gynecology

## 2017-07-29 ENCOUNTER — Encounter: Payer: Self-pay | Admitting: Obstetrics and Gynecology

## 2017-07-29 ENCOUNTER — Ambulatory Visit (INDEPENDENT_AMBULATORY_CARE_PROVIDER_SITE_OTHER): Payer: Self-pay | Admitting: Obstetrics and Gynecology

## 2017-07-29 VITALS — BP 98/65 | HR 106 | Wt 148.0 lb

## 2017-07-29 DIAGNOSIS — N912 Amenorrhea, unspecified: Secondary | ICD-10-CM

## 2017-07-29 NOTE — Progress Notes (Signed)
Obstetrics and Gynecology Visit Return Patient Evaluation  Appointment Date: 07/29/2017  Chief Complaint: amenorrhea  History of Present Illness:  Lisa Liu is a 20 y.o. is s/p early October 2018 svd and no period since then. She is breastfeeding or pumping at least q3-5h. Hasn't had sex yet. She isn't using any hormones for contraception or cycle control   Review of Systems: as noted in the History of Present Illness.  Medications and Allergies: reviewed   Physical Exam:  BP 98/65   Pulse (!) 106   Wt 148 lb (67.1 kg)   BMI 24.63 kg/m  Body mass index is 24.63 kg/m. General appearance: Well nourished, well developed female in no acute distress.    Assessment: lactational amenorrhea   Plan:  1. Lactational amenorrhea D/w her that this is likely the etiology but since she is emptying her breast more so on the side of q4-5h to not consider it contraceptive. Pt understanding and doesn't desire anything right now. D/w her that once she stops breastfeeding or pumping then to call us if still no period after 6wks and a neg UPT   RTC: PRN  Durene Romans MD Attending Center for [facility])

## 2017-07-29 NOTE — Progress Notes (Signed)
Pt has not had a cycle since delivery. She states that she has not had IC. She is still breastfeeding and supplementing with formula.

## 2017-10-17 ENCOUNTER — Encounter (HOSPITAL_COMMUNITY): Payer: Self-pay | Admitting: Family Medicine

## 2017-10-17 ENCOUNTER — Ambulatory Visit (HOSPITAL_COMMUNITY)
Admission: EM | Admit: 2017-10-17 | Discharge: 2017-10-17 | Disposition: A | Discharge status: Home / Self Care | Payer: Self-pay | Attending: Family Medicine | Admitting: Family Medicine

## 2017-10-17 ENCOUNTER — Other Ambulatory Visit: Payer: Self-pay

## 2017-10-17 DIAGNOSIS — J069 Acute upper respiratory infection, unspecified: Secondary | ICD-10-CM

## 2017-10-17 DIAGNOSIS — B9789 Other viral agents as the cause of diseases classified elsewhere: Secondary | ICD-10-CM

## 2017-10-17 MED ORDER — BENZONATATE 100 MG PO CAPS: 100.0000 mg | ORAL_CAPSULE | Freq: Two times a day (BID) | ORAL | 0 refills | Status: DC | PRN

## 2017-10-17 MED ORDER — PREDNISONE 20 MG PO TABS: ORAL_TABLET | ORAL | 0 refills | Status: DC

## 2017-10-17 NOTE — ED Triage Notes (Signed)
Pt presents today with cough, body aches, congestion, and not feeling well for a week. States that she feels like she has cold or flu.

## 2017-10-17 NOTE — ED Provider Notes (Signed)
[facility]   [id number] 10/17/17 Arrival Time: 1812   SUBJECTIVE:  Lisa Liu is a 22 y.o. female who presents to the urgent care with complaint of cough.  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  Past Medical History:  Diagnosis Date  . Fibroids   . Gestational hypertension 05/12/2017  . Insufficient weight gain during pregnancy 04/02/2017   Patient states that she is still throwing up after each meal; she has lost weight since she became pregnant.    Family History  Problem Relation Age of Onset  . Cancer Father   . Varicose Veins Father   . Diabetes Paternal Grandmother   . Hyperlipidemia Paternal Grandmother    Social History   Socioeconomic History  . Marital status: Married    Spouse name: Not on file  . Number of children: Not on file  . Years of education: Not on file  . Highest education level: Not on file  Social Needs  . Financial resource strain: Not on file  . Food insecurity - worry: Not on file  . Food insecurity - inability: Not on file  . Transportation needs - medical: Not on file  . Transportation needs - non-medical: Not on file  Occupational History  . Not on file  Tobacco Use  . Smoking status: Never Smoker  . Smokeless tobacco: Never Used  Substance and Sexual Activity  . Alcohol use: No  . Drug use: No  . Sexual activity: Yes    Birth control/protection: None  Other Topics Concern  . Not on file  Social History Narrative  . Not on file   Current Meds  Medication Sig  . [DISCONTINUED] ibuprofen (ADVIL,MOTRIN) 600 MG tablet Take 1 tablet (600 mg total) by mouth every 8 (eight) hours as needed for mild pain or moderate pain.   No Known Allergies    ROS: As per HPI, remainder of ROS negative.   OBJECTIVE:   Vitals:   10/17/17 1924  BP: 101/64  Pulse: (!) 103  Resp: 16  Temp: 98.9 F  (37.2 C)  TempSrc: Oral  SpO2: 99%     General appearance: alert; no distress Eyes: PERRL; EOMI; conjunctiva normal HENT: normocephalic; atraumatic; TMs normal, canal normal, external ears normal without trauma; nasal mucosa normal; oral mucosa normal Neck: supple Lungs: clear to auscultation bilaterally Heart: regular rate and rhythm Back: no CVA tenderness Extremities: no cyanosis or edema; symmetrical with no gross deformities Skin: warm and dry Neurologic: normal gait; grossly normal Psychological: alert and cooperative; normal mood and affect      Labs:  Results for orders placed or performed during the hospital encounter of 05/11/17  CBC  Result Value Ref Range   WBC 4.7 4.0 - 10.5 K/uL   RBC 3.89 3.87 - 5.11 MIL/uL   Hemoglobin 11.9 (L) 12.0 - 15.0 g/dL   HCT 33.9 (L) 36.0 - 46.0 %   MCV 87.1 78.0 - 100.0 fL   MCH 30.6 26.0 - 34.0 pg   MCHC 35.1 30.0 - 36.0 g/dL   RDW 14.2 11.5 - 15.5 %   Platelets 227 150 - 400 K/uL  Comprehensive metabolic panel  Result Value Ref Range   Sodium 136 135 - 145 mmol/L   Potassium 3.6 3.5 - 5.1 mmol/L   Chloride 105 101 - 111 mmol/L   CO2 20 (L) 22 -  32 mmol/L   Glucose, Bld 93 65 - 99 mg/dL   BUN 7 6 - 20 mg/dL   Creatinine, Ser 0.79 0.44 - 1.00 mg/dL   Calcium 8.7 (L) 8.9 - 10.3 mg/dL   Total Protein 6.3 (L) 6.5 - 8.1 g/dL   Albumin 2.6 (L) 3.5 - 5.0 g/dL   AST 32 15 - 41 U/L   ALT 34 14 - 54 U/L   Alkaline Phosphatase 192 (H) 38 - 126 U/L   Total Bilirubin 0.8 0.3 - 1.2 mg/dL   GFR calc non Af Amer >60 >60 mL/min   GFR calc Af Amer >60 >60 mL/min   Anion gap 11 5 - 15  Protein / creatinine ratio, urine  Result Value Ref Range   Creatinine, Urine 290.00 mg/dL   Total Protein, Urine 48 mg/dL   Protein Creatinine Ratio 0.17 (H) 0.00 - 0.15 mg/mg[Cre]  RPR  Result Value Ref Range   RPR Ser Ql Non Reactive Non Reactive  OB RESULTS CONSOLE GC/Chlamydia  Result Value Ref Range   Gonorrhea Negative   CBC  Result  Value Ref Range   WBC 8.3 4.0 - 10.5 K/uL   RBC 4.20 3.87 - 5.11 MIL/uL   Hemoglobin 13.0 12.0 - 15.0 g/dL   HCT 36.5 36.0 - 46.0 %   MCV 86.9 78.0 - 100.0 fL   MCH 31.0 26.0 - 34.0 pg   MCHC 35.6 30.0 - 36.0 g/dL   RDW 14.2 11.5 - 15.5 %   Platelets 252 150 - 400 K/uL  Type and screen [facility]  Result Value Ref Range   ABO/RH(D) A POS    Antibody Screen NEG    Sample Expiration 05/15/2017     Labs Reviewed - No data to display  No results found.     ASSESSMENT & PLAN:  1. Viral URI with cough     Meds ordered this encounter  Medications  . benzonatate (TESSALON) 100 MG capsule    Sig: Take 1-2 capsules (100-200 mg total) by mouth 2 (two) times daily as needed for cough.    Dispense:  14 capsule    Refill:  0  . predniSONE (DELTASONE) 20 MG tablet    Sig: Two daily with food    Dispense:  10 tablet    Refill:  0    Reviewed expectations re: course of current medical issues. Questions answered. Outlined signs and symptoms indicating need for more acute intervention. Patient verbalized understanding. After Visit Summary given.    Procedures:      Robyn Haber, MD 10/17/17 1944

## 2018-02-06 ENCOUNTER — Encounter (HOSPITAL_COMMUNITY): Payer: Self-pay | Admitting: *Deleted

## 2018-02-06 ENCOUNTER — Emergency Department (HOSPITAL_COMMUNITY): Payer: Self-pay

## 2018-02-06 ENCOUNTER — Other Ambulatory Visit: Payer: Self-pay

## 2018-02-06 ENCOUNTER — Emergency Department (HOSPITAL_COMMUNITY)
Admission: EM | Admit: 2018-02-06 | Discharge: 2018-02-06 | Disposition: A | Discharge status: Home / Self Care | Payer: Self-pay | Attending: Emergency Medicine | Admitting: Emergency Medicine

## 2018-02-06 DIAGNOSIS — Y999 Unspecified external cause status: Secondary | ICD-10-CM | POA: Insufficient documentation

## 2018-02-06 DIAGNOSIS — Y929 Unspecified place or not applicable: Secondary | ICD-10-CM | POA: Insufficient documentation

## 2018-02-06 DIAGNOSIS — Y939 Activity, unspecified: Secondary | ICD-10-CM | POA: Insufficient documentation

## 2018-02-06 DIAGNOSIS — S63501A Unspecified sprain of right wrist, initial encounter: Secondary | ICD-10-CM | POA: Insufficient documentation

## 2018-02-06 DIAGNOSIS — W010XXA Fall on same level from slipping, tripping and stumbling without subsequent striking against object, initial encounter: Secondary | ICD-10-CM | POA: Insufficient documentation

## 2018-02-06 DIAGNOSIS — Z79899 Other long term (current) drug therapy: Secondary | ICD-10-CM | POA: Insufficient documentation

## 2018-02-06 MED ORDER — IBUPROFEN 800 MG PO TABS
800.0000 mg | ORAL_TABLET | Freq: Once | ORAL | Status: AC
  Administered 2018-02-06: 800 mg via ORAL
  Filled 2018-02-06: qty 1

## 2018-02-06 MED ORDER — IBUPROFEN 800 MG PO TABS: 800.0000 mg | ORAL_TABLET | Freq: Three times a day (TID) | ORAL | 0 refills | Status: AC

## 2018-02-06 NOTE — ED Provider Notes (Signed)
[facility] DEPT Provider Note  CSN: [id number] Arrival date & time: 02/06/18  1910    History   Chief Complaint Chief Complaint  Patient presents with  . Wrist Pain    d/t fall    HPI Lisa Liu is a 22 y.o. female with a medical history of fibroids who presented to the ED for right wrist pain. Patient states her friend's dog ran out of in front of her and she tripped over it today falling on her wrist. She is unsure of how her wrist took the impact. Endorses pain with movements and palpation. Denies swelling, deformity, paresthesias, weakness or change in color or temperature.   Past Medical History:  Diagnosis Date  . Fibroids   . Gestational hypertension 05/12/2017  . Insufficient weight gain during pregnancy 04/02/2017   Patient states that she is still throwing up after each meal; she has lost weight since she became pregnant.     Patient Active Problem List   Diagnosis Date Noted  . Fibroid uterus 07/18/2016    History reviewed. No pertinent surgical history.   OB History    Gravida  2   Para  1   Term  1   Preterm      AB  1   Living  1     SAB  1   TAB      Ectopic      Multiple  0   Live Births  1            Home Medications    Prior to Admission medications   Medication Sig Start Date End Date Taking? Authorizing Provider  benzonatate (TESSALON) 100 MG capsule Take 1-2 capsules (100-200 mg total) by mouth 2 (two) times daily as needed for cough. 10/17/17   Robyn Haber, MD  ibuprofen (ADVIL,MOTRIN) 800 MG tablet Take 1 tablet (800 mg total) by mouth 3 (three) times daily for 10 days. 02/06/18 02/16/18  Mortis, Alvie Heidelberg I, PA-C  predniSONE (DELTASONE) 20 MG tablet Two daily with food 10/17/17   Robyn Haber, MD    Family History Family History  Problem Relation Age of Onset  . Cancer Father   . Varicose Veins Father   . Diabetes Paternal Grandmother   . Hyperlipidemia Paternal  Grandmother     Social History Social History   Tobacco Use  . Smoking status: Never Smoker  . Smokeless tobacco: Never Used  Substance Use Topics  . Alcohol use: No  . Drug use: No     Allergies   Patient has no known allergies.   Review of Systems Review of Systems  Constitutional: Negative.   Musculoskeletal: Positive for arthralgias. Negative for joint swelling.  Skin: Negative for color change and pallor.  Neurological: Negative for weakness and numbness.  Hematological: Negative.      Physical Exam Updated Vital Signs BP 118/74 (BP Location: Left Arm)   Pulse 97   Temp 98.4 F (36.9 C) (Oral)   Resp 16   Ht 5\' 5"  (1.651 m)   Wt 79.1 kg (174 lb 4.8 oz)   LMP 01/09/2018 (Approximate)   SpO2 100%   BMI 29.01 kg/m   Physical Exam  Constitutional: She appears well-developed and well-nourished.  Cardiovascular:  Pulses:      Radial pulses are 2+ on the right side, and 2+ on the left side.  Musculoskeletal:       Right elbow: Normal.      Left elbow: Normal.  Right wrist: She exhibits tenderness, bony tenderness and deformity. She exhibits normal range of motion and no swelling.       Left wrist: Normal.       Right forearm: She exhibits tenderness.       Left forearm: Normal.       Arms:      Right hand: She exhibits normal range of motion, no tenderness, no bony tenderness and normal capillary refill. Normal sensation noted. Decreased strength noted.       Left hand: Normal.  Full active and passive ROM in right wrist, but endorses pain with movements. Grip, extension/flexion and deviations all intact. 4/5 strength in right wrist compared to left.  Neurological: No sensory deficit. She exhibits normal muscle tone.  Reflex Scores:      Bicep reflexes are 2+ on the right side and 2+ on the left side.      Brachioradialis reflexes are 2+ on the right side and 2+ on the left side. Skin: Skin is warm and intact. Capillary refill takes less than 2  seconds. No bruising and no ecchymosis noted.  Nursing note and vitals reviewed.    ED Treatments / Results  Labs (all labs ordered are listed, but only abnormal results are displayed) Labs Reviewed - No data to display  EKG None  Radiology Dg Wrist Complete Right  Result Date: 02/06/2018 CLINICAL DATA:  Fall with generalized wrist pain EXAM: RIGHT WRIST - COMPLETE 3+ VIEW COMPARISON:  None. FINDINGS: There is no evidence of fracture or dislocation. There is no evidence of arthropathy or other focal bone abnormality. Soft tissues are unremarkable. IMPRESSION: Negative. Electronically Signed   By: Donavan Foil M.D.   On: 02/06/2018 20:42    Procedures Procedures (including critical care time)  Medications Ordered in ED Medications  ibuprofen (ADVIL,MOTRIN) tablet 800 mg (has no administration in time range)     Initial Impression / Assessment and Plan / ED Course  Triage vital signs and the nursing notes have been reviewed.  Pertinent labs & imaging results that were available during care of the patient were reviewed and considered in medical decision making (see chart for details).   Patient is in no distress and well appearing. Patient has full sensation in right wrist. She also has full active and passive ROM. No deformities, decreased muscle tone or other abnormalities visualized. Patient has no focal neuro deficits. Neurovascular function intact. Physical exam and x-rays are reassuring as no fractures or dislocations are seen. There are no other physical exam findings or s/s that suggest an infectious or rheumatologic etiology to her pain.  Final Clinical Impressions(s) / ED Diagnoses  1. Right Wrist Sprain. Ibuprofen 800mg  TID PRN x10 days. Wrist brace applied in the ED. Education provided on OTC and supportive treatment for pain relief and inflammation.  Dispo: Home. After thorough clinical evaluation, this patient is determined to be medically stable and can be safely  discharged with the previously mentioned treatment and/or outpatient follow-up/referral(s). At this time, there are no other apparent medical conditions that require further screening, evaluation or treatment.   Final diagnoses:  Sprain of right wrist, initial encounter    ED Discharge Orders        Ordered    ibuprofen (ADVIL,MOTRIN) 800 MG tablet  3 times daily     02/06/18 2108        Junita Push 02/06/18 2129    Charlesetta Shanks, MD 02/09/18 1231

## 2018-02-06 NOTE — Discharge Instructions (Addendum)
X-ray was normal. No fractures or dislocations.  You may wear the wrist brace for 1 week. It can be taken off to bathe and at bedtime. Take the ibuprofen I prescribed you three times a day for the next 3-5 days to help with swelling and pain. You may also use Tylenol and cold compresses for 15-20 minutes without the brace to provide relief too.

## 2018-02-06 NOTE — ED Triage Notes (Signed)
Pt stated "the dog ran into me and made me fall making me fall on my right wrist."  No obvious deformity.

## 2018-09-09 ENCOUNTER — Ambulatory Visit (HOSPITAL_COMMUNITY)
Admission: EM | Admit: 2018-09-09 | Discharge: 2018-09-09 | Disposition: A | Discharge status: Home / Self Care | Payer: Self-pay | Attending: Family Medicine | Admitting: Family Medicine

## 2018-09-09 ENCOUNTER — Encounter (HOSPITAL_COMMUNITY): Payer: Self-pay | Admitting: Emergency Medicine

## 2018-09-09 DIAGNOSIS — N926 Irregular menstruation, unspecified: Secondary | ICD-10-CM | POA: Insufficient documentation

## 2018-09-09 LAB — POCT URINALYSIS DIP (DEVICE)
Bilirubin Urine: NEGATIVE
Glucose, UA: NEGATIVE mg/dL
Hgb urine dipstick: NEGATIVE
Ketones, ur: NEGATIVE mg/dL
Leukocytes, UA: NEGATIVE
Nitrite: NEGATIVE
Protein, ur: NEGATIVE mg/dL
Specific Gravity, Urine: >=1.03 (ref 1.005–1.030)
Urobilinogen, UA: 0.2 mg/dL (ref 0.0–1.0)
pH: 6 (ref 5.0–8.0)

## 2018-09-09 LAB — POCT PREGNANCY, URINE: Preg Test, Ur: NEGATIVE

## 2018-09-09 NOTE — ED Triage Notes (Signed)
Pt reports she's 10 days behind on her menstrual period   Sts she did a preg test and it = Neg  Sx also mild abd pain, diarrhea   Denies nausea, vomiting  A&O x4... NAD.Marland Kitchen. ambulatory

## 2018-09-09 NOTE — ED Provider Notes (Signed)
[facility]    CSN: [id number] Arrival date & time: 09/09/18  1143     History   Chief Complaint Chief Complaint  Patient presents with  . Menstrual Problem    HPI Lisa Liu is a 23 y.o. female.   Patient is a 23 year old female with past medical history of fibroids, gestational hypertension.  She presents with complaints of missed menstrual cycle.  She is approximately 10 days late on her menstrual cycle.  Her last menstrual cycle was 08/01/2018.  Her cycles are typically regular.  She has had some mild menstrual cramping.  Denies any vaginal discharge, itching or irritation.  Denies any urinary symptoms.  Denies any fevers, chills, myalgias.  She is currently sexually active with one partner unprotected.  She is not any high birth control.  She took a pregnancy test at home that was negative.  ROS per HPI      Past Medical History:  Diagnosis Date  . Fibroids   . Gestational hypertension 05/12/2017  . Insufficient weight gain during pregnancy 04/02/2017   Patient states that she is still throwing up after each meal; she has lost weight since she became pregnant.     Patient Active Problem List   Diagnosis Date Noted  . Fibroid uterus 07/18/2016    History reviewed. No pertinent surgical history.  OB History    Gravida  2   Para  1   Term  1   Preterm      AB  1   Living  1     SAB  1   TAB      Ectopic      Multiple  0   Live Births  1            Home Medications    Prior to Admission medications   Not on File    Family History Family History  Problem Relation Age of Onset  . Cancer Father   . Varicose Veins Father   . Diabetes Paternal Grandmother   . Hyperlipidemia Paternal Grandmother     Social History Social History   Tobacco Use  . Smoking status: Never Smoker  . Smokeless tobacco: Never Used  Substance Use Topics  . Alcohol use: No  . Drug use: No     Allergies   Patient has no known  allergies.   Review of Systems Review of Systems   Physical Exam Triage Vital Signs ED Triage Vitals [09/09/18 1232]  Enc Vitals Group     BP 114/64     Pulse Rate 82     Resp 16     Temp 98.1 F (36.7 C)     Temp Source Oral     SpO2 100 %     Weight      Height      Head Circumference      Peak Flow      Pain Score 4     Pain Loc      Pain Edu?      Excl. in [place]?    No data found.  Updated Vital Signs BP 114/64 (BP Location: Left Arm)   Pulse 82   Temp 98.1 F (36.7 C) (Oral)   Resp 16   LMP 08/01/2018   SpO2 100%   Breastfeeding No   Visual Acuity Right Eye Distance:   Left Eye Distance:   Bilateral Distance:    Right Eye Near:   Left Eye Near:  Bilateral Near:     Physical Exam Vitals signs and nursing note reviewed.  Constitutional:      General: She is not in acute distress.    Appearance: Normal appearance. She is well-developed. She is not ill-appearing, toxic-appearing or diaphoretic.  HENT:     Head: Normocephalic and atraumatic.  Eyes:     Conjunctiva/sclera: Conjunctivae normal.  Cardiovascular:     Heart sounds: No murmur.  Abdominal:     Palpations: Abdomen is soft.     Tenderness: There is no abdominal tenderness.  Musculoskeletal: Normal range of motion.  Skin:    General: Skin is warm and dry.     Findings: No rash.  Neurological:     Mental Status: She is alert.  Psychiatric:        Mood and Affect: Mood normal.      UC Treatments / Results  Labs (all labs ordered are listed, but only abnormal results are displayed) Labs Reviewed  POC URINE PREG, ED  POCT PREGNANCY, URINE  POCT URINALYSIS DIP (DEVICE)  CERVICOVAGINAL ANCILLARY ONLY    EKG None  Radiology No results found.  Procedures Procedures (including critical care time)  Medications Ordered in UC Medications - No data to display  Initial Impression / Assessment and Plan / UC Course  I have reviewed the triage vital signs and the nursing  notes.  Pertinent labs & imaging results that were available during my care of the patient were reviewed by me and considered in my medical decision making (see chart for details).     Urine negative for pregnancy or infection here Patient reports that she has been under a lot of stress recently.  This could be the cause of her missed menstrual cycle. Other reasons for menstrual irregularities include infection, hormonal changes and stress. 1 of these could be the cause. Spoke about this with the patient Sending self swab to test for infection We will call with any positive results Instructed that if her symptoms continue and she does not have a menstrual cycle next month she will need to follow-up with her OB/GYN for further evaluation and management Patient understanding and agreeable to plan  Final Clinical Impressions(s) / UC Diagnoses   Final diagnoses:  Menstrual irregularity     Discharge Instructions     As we spoke about menstrual cycles can be late for many different reasons. It could be hormonal related, stress related, infection related. Your urine was negative for pregnancy or infection If your period does not return you need to follow-up with OB/GYN for further evaluation We will send the swab for testing and call you with any positive results    ED Prescriptions    None     Controlled Substance Prescriptions [place] Controlled Substance Registry consulted? Not Applicable   Orvan July, NP 09/09/18 1536

## 2018-09-09 NOTE — Discharge Instructions (Addendum)
As we spoke about menstrual cycles can be late for many different reasons. It could be hormonal related, stress related, infection related. Your urine was negative for pregnancy or infection If your period does not return you need to follow-up with OB/GYN for further evaluation We will send the swab for testing and call you with any positive results

## 2018-09-12 LAB — CERVICOVAGINAL ANCILLARY ONLY
BACTERIAL VAGINITIS: NEGATIVE
CANDIDA VAGINITIS: NEGATIVE
Chlamydia: NEGATIVE
Neisseria Gonorrhea: NEGATIVE
Trichomonas: NEGATIVE

## 2018-09-30 IMAGING — US US OB COMP LESS 14 WK
1 series · 15 of 28 positions shown · non-contrast
Comparison: None.

CLINICAL DATA: Lower abdominal pain for 1 day.

EXAM:
OBSTETRIC <14 WK ULTRASOUND
TECHNIQUE: Transabdominal ultrasound was performed for evaluation of the
gestation as well as the maternal uterus and adnexal regions.

[Series 1: us ob comp less 14 wk · 15 of 33 slices shown]
[im 1/33]
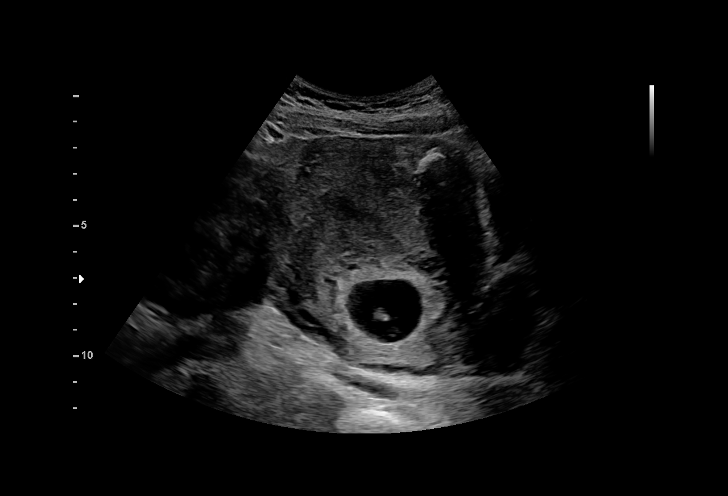
[im 3/33]
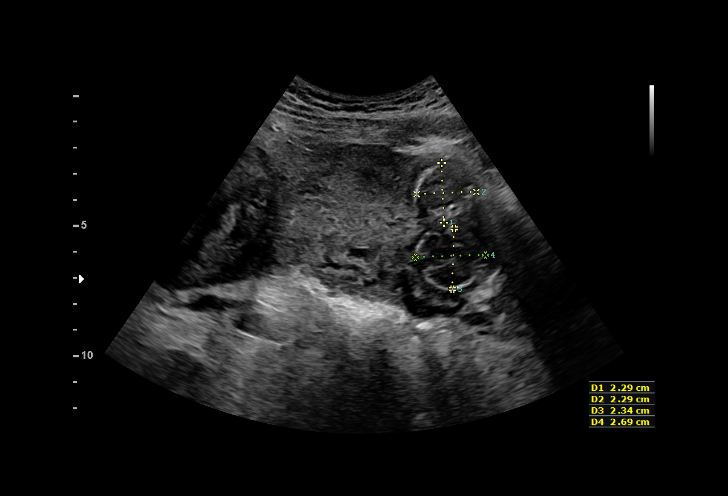
[im 5/33]
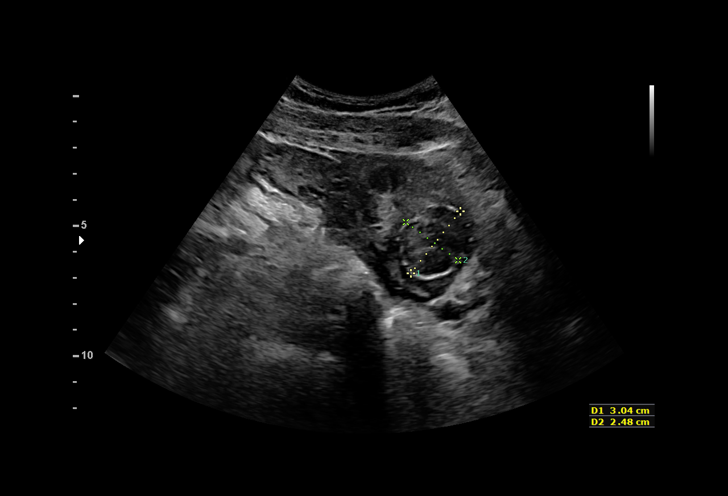
[im 8/33]
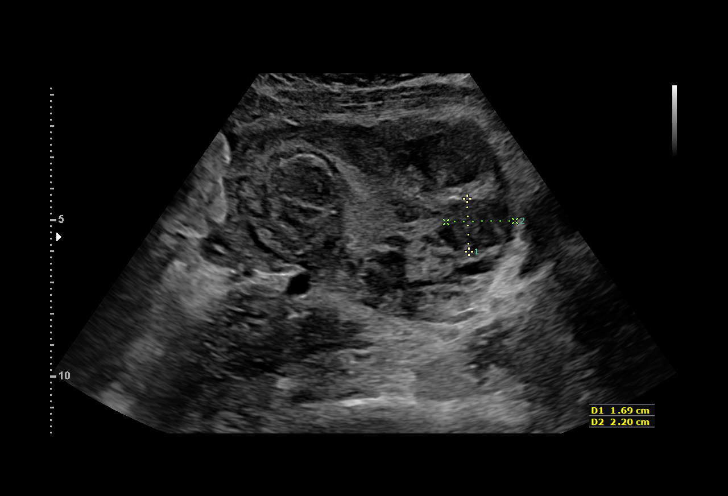
[im 10/33]
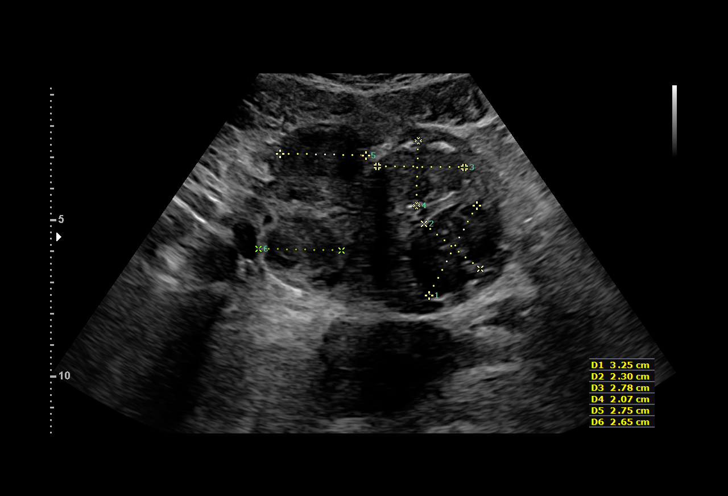
[im 12/33]
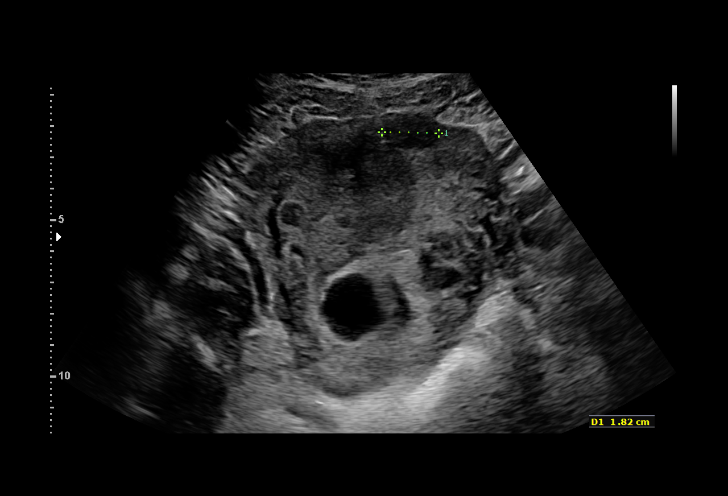
[im 15/33]
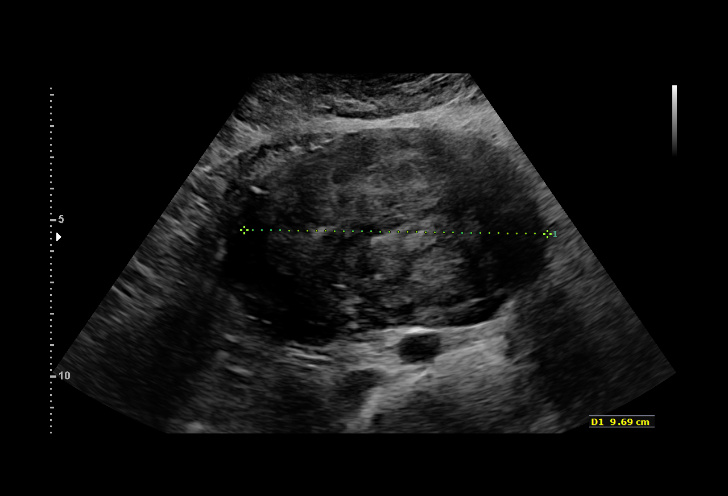
[im 17/33]
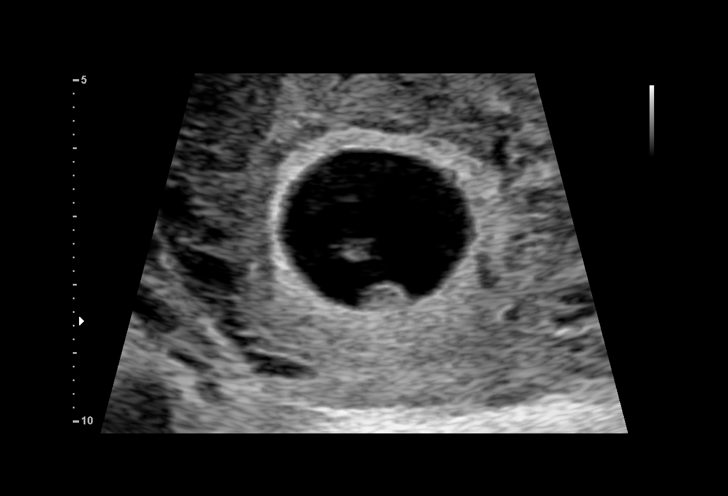
[im 18/33]
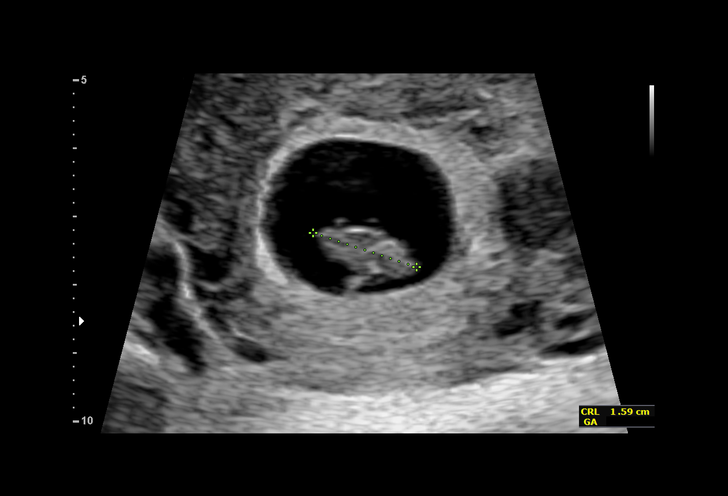
[im 21/33]
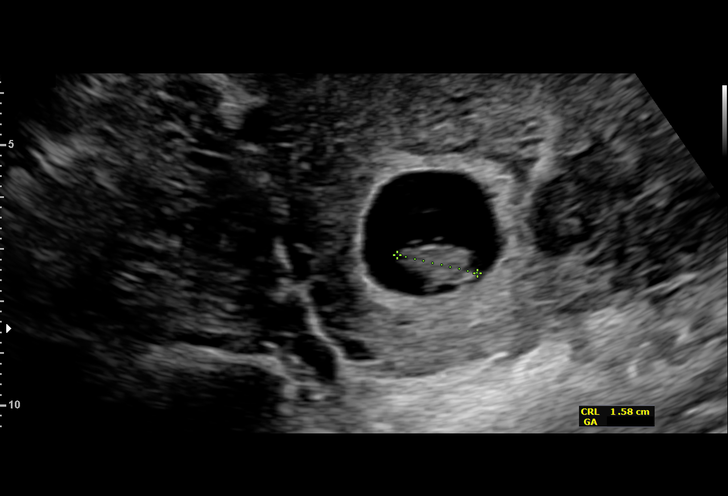
[im 23/33]
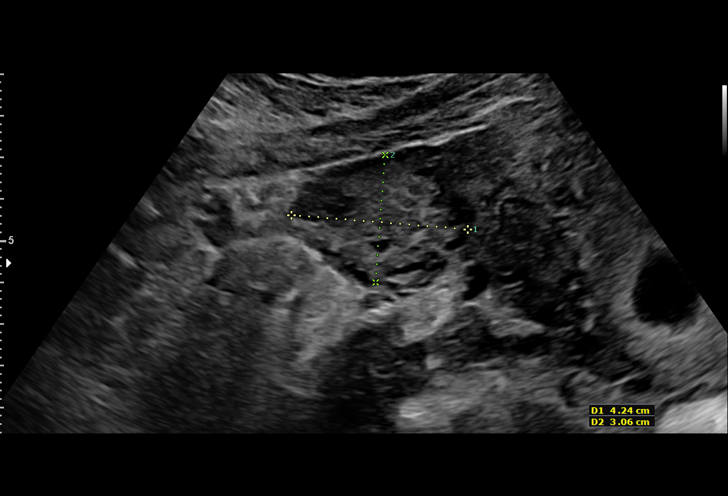
[im 25/33]
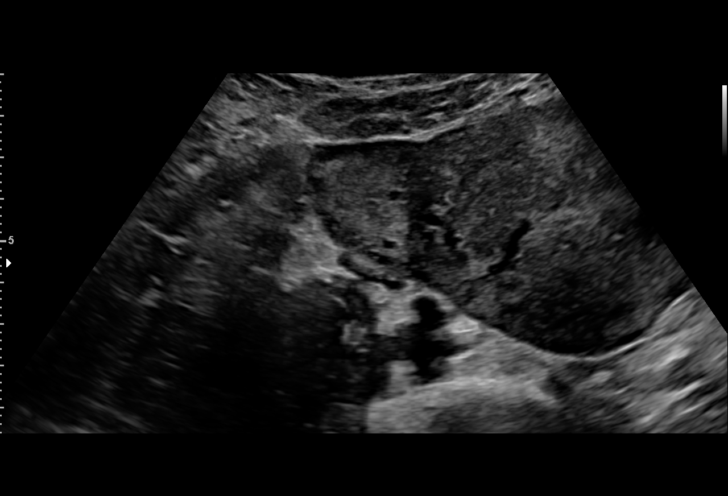
[im 28/33]
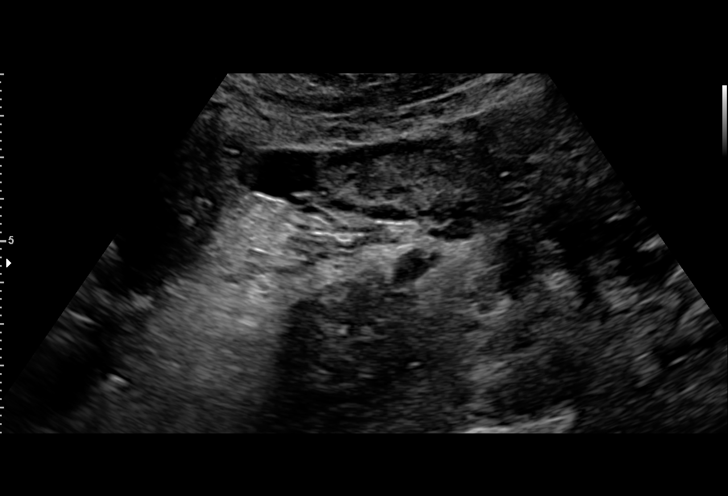
[im 30/33]
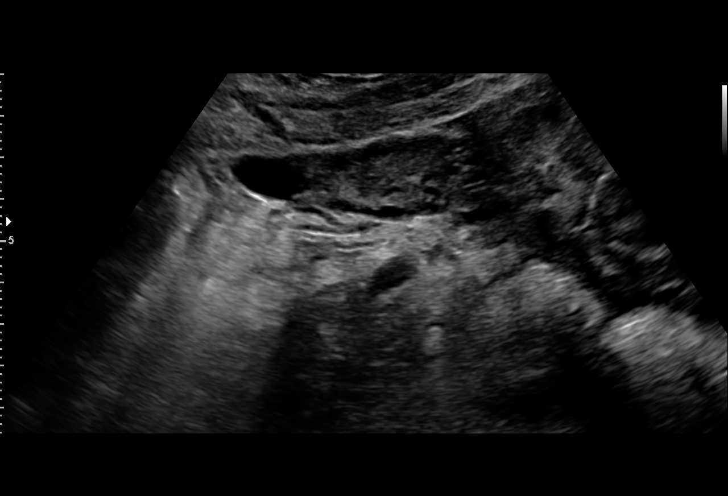
[im 33/33]
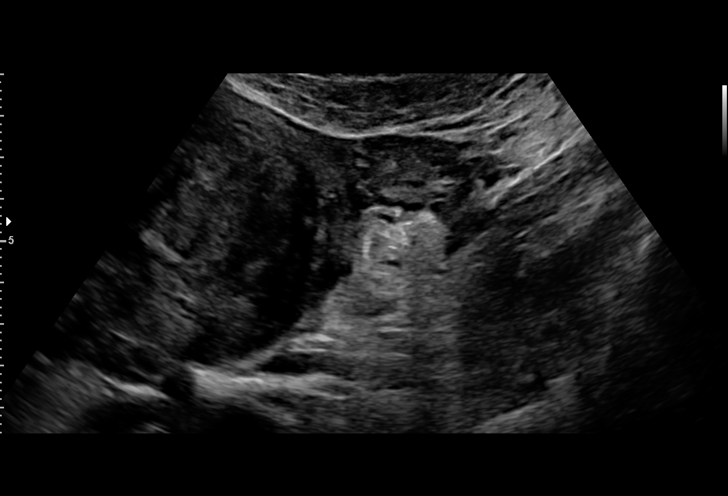

[15 of 28 positions shown; findings below may reference images not displayed]

FINDINGS: Intrauterine gestational sac: Single

Yolk sac:  Visualized

Embryo:  Visualized

Cardiac Activity: Visualized

Heart Rate: 165 bpm

MSD:   mm    w     d

CRL:   15.5  mm   7 w 6 d                  US EDC: 05/28/2017

Subchorionic hemorrhage:  None visualized.

Maternal uterus/adnexae: No adnexal mass or free fluid. Numerous
fibroids throughout the uterus, the largest arising from the fundus
measuring 10 cm.
IMPRESSION: Seven week 6 day intrauterine pregnancy. Fetal heart rate 165 beats
per minute.

Numerous uterine fibroids, the largest in the fundus measuring 10
cm.

## 2019-03-27 IMAGING — US US ABDOMEN COMPLETE
1 series · 15 of 25 positions shown · non-contrast
Comparison: None.

CLINICAL DATA: Vomiting, weakness, elevated liver function studies.
Thirty-three weeks pregnant.

EXAM:
ABDOMEN ULTRASOUND COMPLETE

[Series 1: us abdomen complete · 15 of 94 slices shown]
[im 1/94]
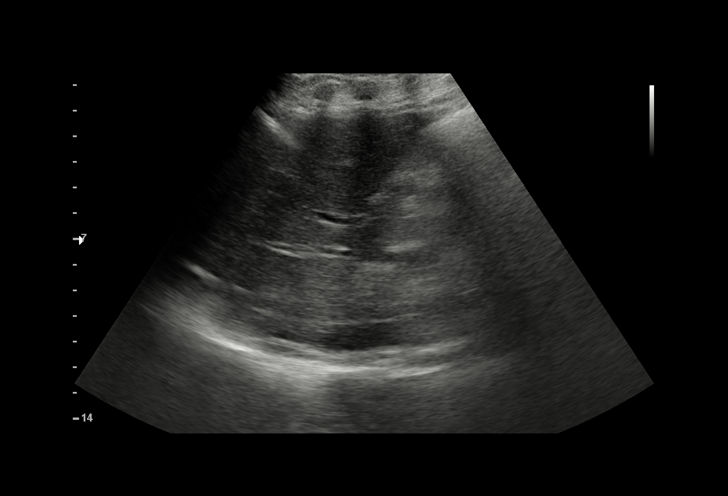
[im 8/94]
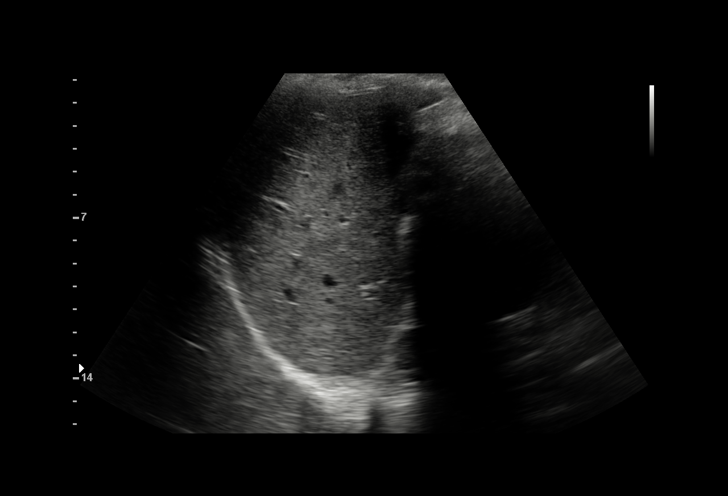
[im 16/94]
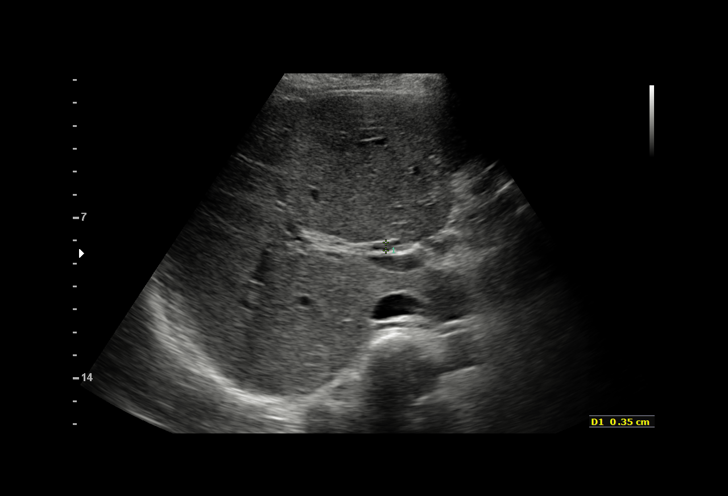
[im 20/94]
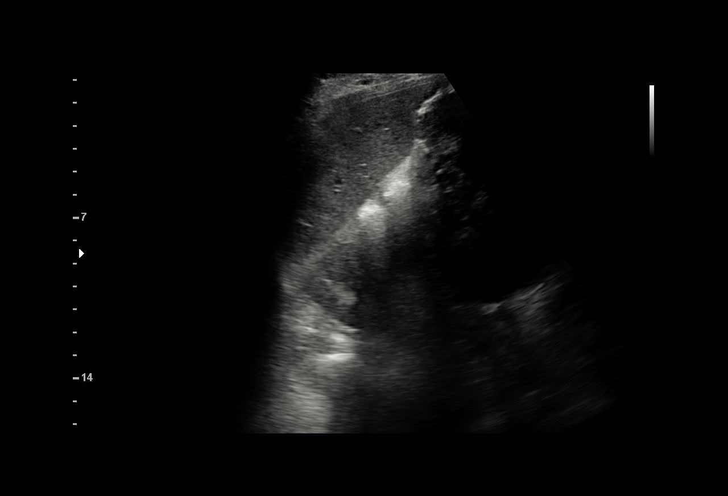
[im 28/94]
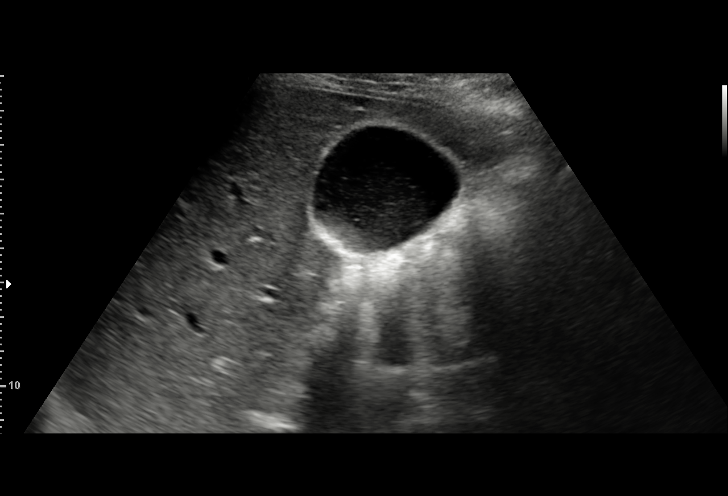
[im 35/94]
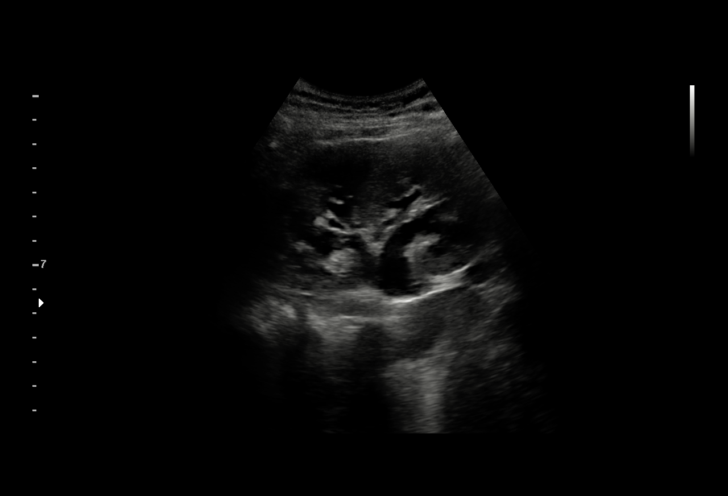
[im 39/94]
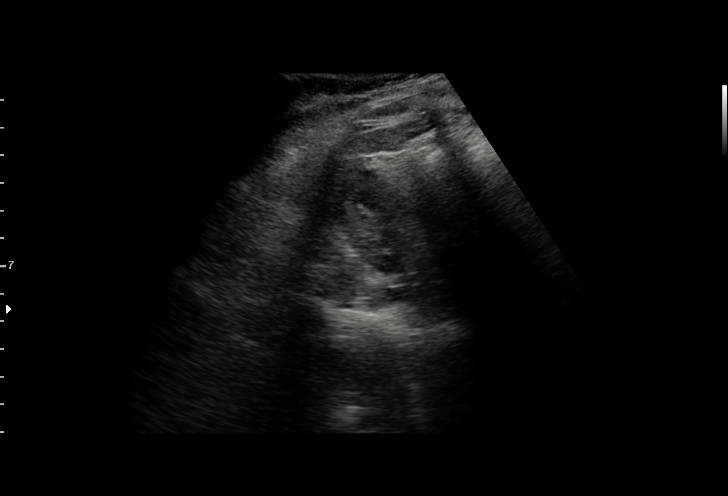
[im 47/94]
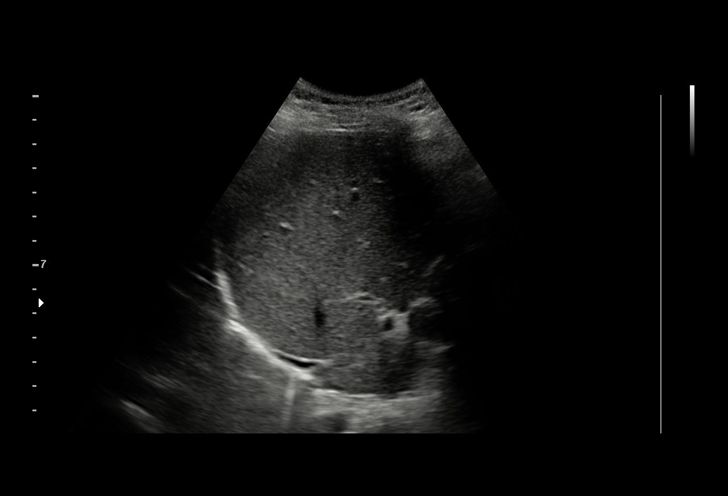
[im 55/94]
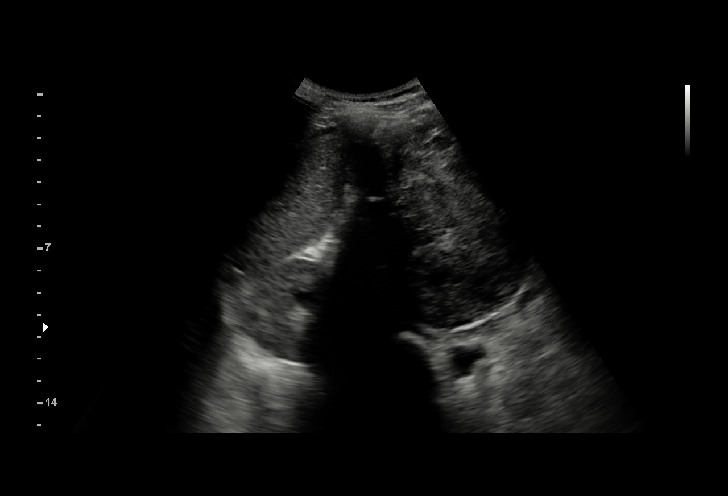
[im 59/94]
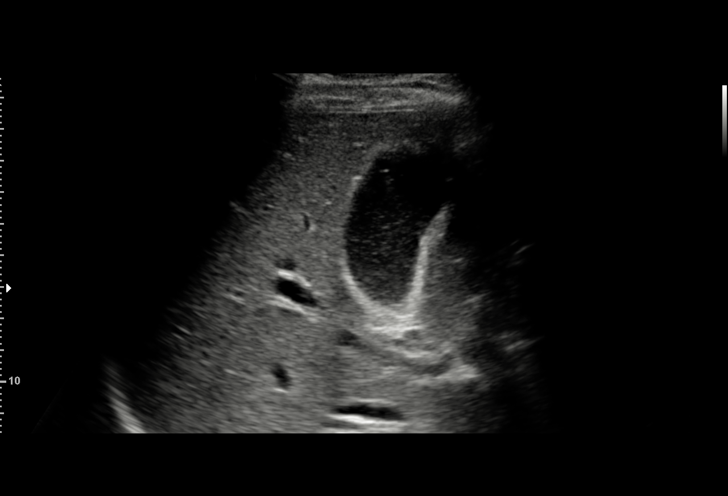
[im 66/94]
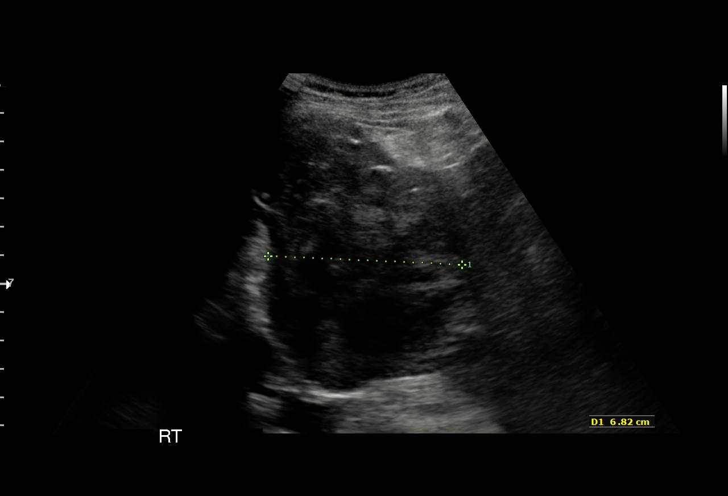
[im 74/94]
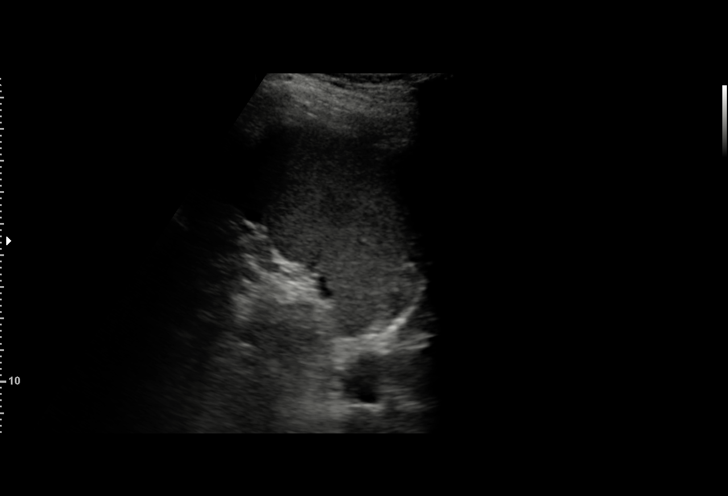
[im 78/94]
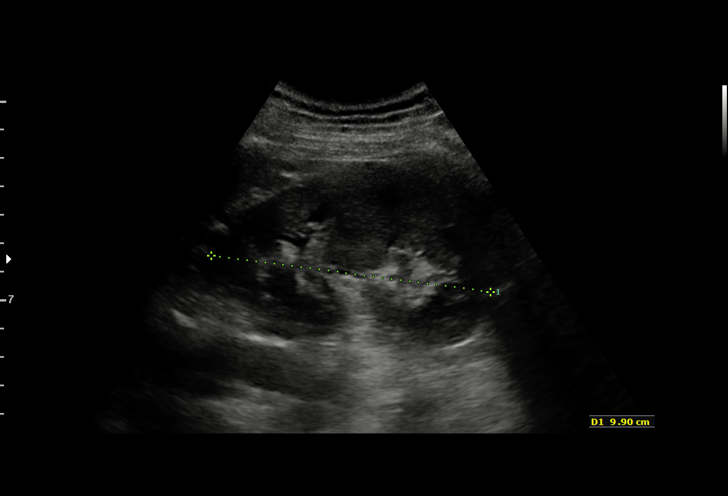
[im 86/94]
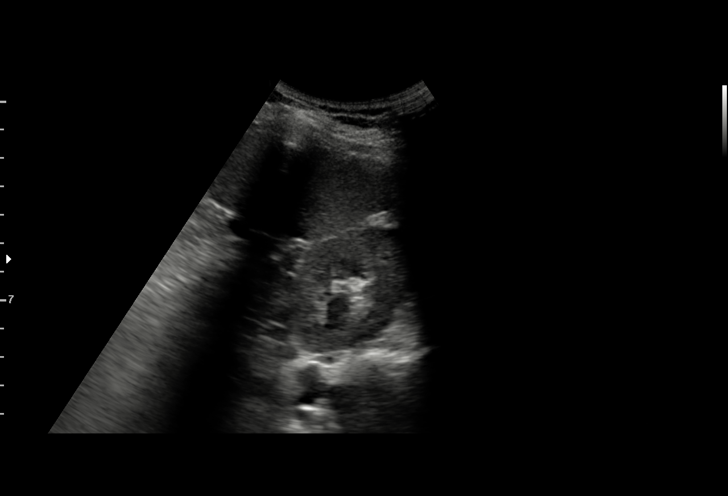
[im 94/94]
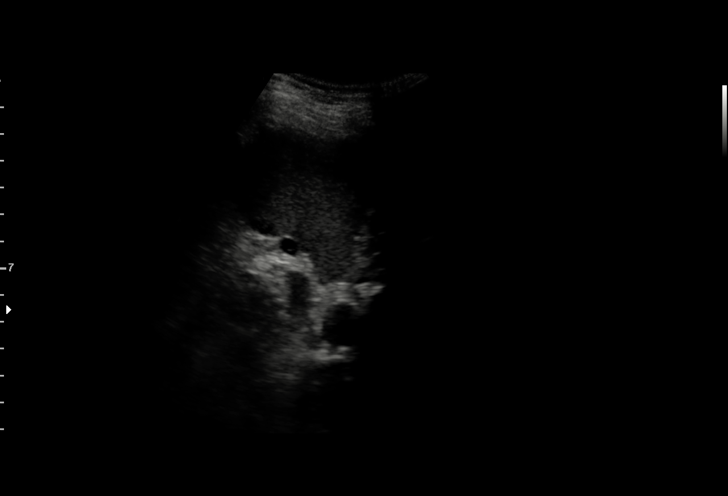

[15 of 25 positions shown; findings below may reference images not displayed]

FINDINGS: Gallbladder: Gallbladder sludge is present. No stones. No
gallbladder wall thickening or edema. Murphy's sign is negative.

Common bile duct: Diameter: 3.5 mm, normal

Liver: No focal lesion identified. Within normal limits in
parenchymal echogenicity. Portal vein is patent on color Doppler
imaging with normal direction of blood flow towards the liver.

IVC: Limited visualization.  No abnormality visualized.

Pancreas: Not visualized due to overlying bowel gas.

Spleen: Size and appearance within normal limits.

Right Kidney: Length: 10.5 cm. Mildly dilated intrarenal collecting
system demonstrated. This is likely due to compression from the
pregnancy.

Left Kidney: Length: 9.9 cm. Echogenicity within normal limits. No
mass or hydronephrosis visualized.

Abdominal aorta: No aneurysm visualized.

Other findings: AP solid heterogeneous hypoechoic mass lesion is
demonstrated in the right pelvis extending up to the liver edge.
Pelvic structures are incompletely demonstrated during this
abdominal examination but this likely represents a uterine fibroid.
Multiple uterine fibroids were noted on previous obstetrical
ultrasound from 02/13/2017.
IMPRESSION: 1. Gallbladder sludge consistent with stasis. No evidence of
cholelithiasis or cholecystitis.
2. Mildly dilated intrarenal collecting system on the right likely
due to compression from the pregnancy.
3. Heterogeneous solid mass demonstrated in the right pelvis
consistent with uterine fibroid.

## 2020-01-24 ENCOUNTER — Other Ambulatory Visit: Payer: Self-pay

## 2020-01-24 ENCOUNTER — Ambulatory Visit (HOSPITAL_COMMUNITY)
Admission: EM | Admit: 2020-01-24 | Discharge: 2020-01-24 | Disposition: A | Discharge status: Home / Self Care | Payer: Self-pay | Attending: Emergency Medicine | Admitting: Emergency Medicine

## 2020-01-24 DIAGNOSIS — Z3202 Encounter for pregnancy test, result negative: Secondary | ICD-10-CM

## 2020-01-24 DIAGNOSIS — R1030 Lower abdominal pain, unspecified: Secondary | ICD-10-CM | POA: Insufficient documentation

## 2020-01-24 DIAGNOSIS — N912 Amenorrhea, unspecified: Secondary | ICD-10-CM

## 2020-01-24 LAB — POC URINE PREG, ED: Preg Test, Ur: NEGATIVE

## 2020-01-24 LAB — POCT URINALYSIS DIP (DEVICE)
Bilirubin Urine: NEGATIVE
Glucose, UA: NEGATIVE mg/dL
Hgb urine dipstick: NEGATIVE
Ketones, ur: NEGATIVE mg/dL
Leukocytes,Ua: NEGATIVE
Nitrite: NEGATIVE
Protein, ur: NEGATIVE mg/dL
Specific Gravity, Urine: 1.025 (ref 1.005–1.030)
Urobilinogen, UA: 0.2 mg/dL (ref 0.0–1.0)
pH: 8.5 — ABNORMAL HIGH (ref 5.0–8.0)

## 2020-01-24 MED ORDER — NAPROXEN 500 MG PO TABS: 500.0000 mg | ORAL_TABLET | Freq: Two times a day (BID) | ORAL | 0 refills | Status: DC

## 2020-01-24 NOTE — ED Triage Notes (Signed)
Pt presents today with irregular periods and abdominal pain. Pt requesting pregnancy test. Pt states abdominal pain is low and bilateral and described as "cramping". Pt states last period was 11/2019.

## 2020-01-24 NOTE — Discharge Instructions (Signed)
STD screening pending Urine normal Naprosyn as needed for cramping Follow up with OBGYN if continuing to have irregular cycles/pelvic cramping Return if worsening/changing

## 2020-01-25 LAB — CERVICOVAGINAL ANCILLARY ONLY
Bacterial Vaginitis (gardnerella): NEGATIVE
Candida Glabrata: NEGATIVE
Candida Vaginitis: NEGATIVE
Chlamydia: NEGATIVE
Comment: NEGATIVE
Comment: NEGATIVE
Comment: NEGATIVE
Comment: NEGATIVE
Comment: NEGATIVE
Comment: NORMAL
Neisseria Gonorrhea: NEGATIVE
Trichomonas: NEGATIVE

## 2020-01-25 NOTE — ED Provider Notes (Signed)
[place]    CSN: [id number] Arrival date & time: 01/24/20  1633      History   Chief Complaint Chief Complaint  Patient presents with  . Amenorrhea    HPI Lisa Liu is a 24 y.o. female presenting today for evaluation of irregular period and abdominal pain. She reports over the past week she has had lower abdominal pain/cramping. Feels like menstrual cramping. LMP 11/2019, no cycle in May. Home pregnancy test negative. Denies urinary symptoms. Denies Pelvic symptoms. Sexually active with 1 partner, no suspicion of STD. Bowels normal and regular. Eating and drinking normally without affecting abdominal pain. Denies pain at current. Waxes and wanes.  HPI  Past Medical History:  Diagnosis Date  . Fibroids   . Gestational hypertension 05/12/2017  . Insufficient weight gain during pregnancy 04/02/2017   Patient states that she is still throwing up after each meal; she has lost weight since she became pregnant.     Patient Active Problem List   Diagnosis Date Noted  . Fibroid uterus 07/18/2016    No past surgical history on file.  OB History    Gravida  2   Para  1   Term  1   Preterm      AB  1   Living  1     SAB  1   TAB      Ectopic      Multiple  0   Live Births  1            Home Medications    Prior to Admission medications   Medication Sig Start Date End Date Taking? Authorizing Provider  naproxen (NAPROSYN) 500 MG tablet Take 1 tablet (500 mg total) by mouth 2 (two) times daily. 01/24/20   Marnee Sherrard, Elesa Hacker, PA-C    Family History Family History  Problem Relation Age of Onset  . Cancer Father   . Varicose Veins Father   . Diabetes Paternal Grandmother   . Hyperlipidemia Paternal Grandmother     Social History Social History   Tobacco Use  . Smoking status: Never Smoker  . Smokeless tobacco: Never Used  Vaping Use  . Vaping Use: Never used  Substance Use Topics  . Alcohol use: No  . Drug use: No      Allergies   Patient has no known allergies.   Review of Systems Review of Systems  Constitutional: Negative for fever.  Respiratory: Negative for shortness of breath.   Cardiovascular: Negative for chest pain.  Gastrointestinal: Positive for abdominal pain. Negative for diarrhea, nausea and vomiting.  Genitourinary: Positive for menstrual problem. Negative for dysuria, flank pain, genital sores, hematuria, vaginal bleeding, vaginal discharge and vaginal pain.  Musculoskeletal: Negative for back pain.  Skin: Negative for rash.  Neurological: Negative for dizziness, light-headedness and headaches.     Physical Exam Triage Vital Signs ED Triage Vitals [01/24/20 1712]  Enc Vitals Group     BP 122/68     Pulse Rate 82     Resp 16     Temp 98.2 F (36.8 C)     Temp Source Oral     SpO2 100 %     Weight      Height      Head Circumference      Peak Flow      Pain Score 0     Pain Loc      Pain Edu?      Excl. in [place]?  No data found.  Updated Vital Signs BP 122/68 (BP Location: Right Arm)   Pulse 82   Temp 98.2 F (36.8 C) (Oral)   Resp 16   LMP 11/15/2019 (Exact Date)   SpO2 100%   Visual Acuity Right Eye Distance:   Left Eye Distance:   Bilateral Distance:    Right Eye Near:   Left Eye Near:    Bilateral Near:     Physical Exam Vitals and nursing note reviewed.  Constitutional:      Appearance: She is well-developed.     Comments: No acute distress  HENT:     Head: Normocephalic and atraumatic.     Nose: Nose normal.     Mouth/Throat:     Comments: Oral mucosa pink and moist, no tonsillar enlargement or exudate. Posterior pharynx patent and nonerythematous, no uvula deviation or swelling. Normal phonation.  Eyes:     Conjunctiva/sclera: Conjunctivae normal.  Cardiovascular:     Rate and Rhythm: Normal rate.  Pulmonary:     Effort: Pulmonary effort is normal. No respiratory distress.     Comments: Breathing comfortably at rest, CTABL, no  wheezing, rales or other adventitious sounds auscultated  Abdominal:     General: There is no distension.  Genitourinary:    Comments: Normal external female genitalia, vaginal mucosa pink, no significant discharge, cervix pink Musculoskeletal:        General: Normal range of motion.     Cervical back: Neck supple.  Skin:    General: Skin is warm and dry.  Neurological:     Mental Status: She is alert and oriented to person, place, and time.      UC Treatments / Results  Labs (all labs ordered are listed, but only abnormal results are displayed) Labs Reviewed  POCT URINALYSIS DIP (DEVICE) - Abnormal; Notable for the following components:      Result Value   pH 8.5 (*)    All other components within normal limits  POC URINE PREG, ED  CERVICOVAGINAL ANCILLARY ONLY    EKG   Radiology No results found.  Procedures Procedures (including critical care time)  Medications Ordered in UC Medications - No data to display  Initial Impression / Assessment and Plan / UC Course  I have reviewed the triage vital signs and the nursing notes.  Pertinent labs & imaging results that were available during my care of the patient were reviewed by me and considered in my medical decision making (see chart for details).  Clinical Course as of Jan 25 1239  Tue Jan 24, 2020  1750 POCT urinalysis dip (device)(!) [HW]    Clinical Course User Index [HW] Mckay Brandt, Markham C, PA-C    Pregnancy test negative, UA unremarkable, STD screening pending. Naprosyn for cramping. Monitor for cycle to return. If pain/amennorhea continuing, follow up with OBGYN for evaluation of cysts/fibroids/etc. Seems less GI related.   Discussed strict return precautions. Patient verbalized understanding and is agreeable with plan.  Final Clinical Impressions(s) / UC Diagnoses   Final diagnoses:  Lower abdominal pain     Discharge Instructions     STD screening pending Urine normal Naprosyn as needed for  cramping Follow up with OBGYN if continuing to have irregular cycles/pelvic cramping Return if worsening/changing   ED Prescriptions    Medication Sig Dispense Auth. Provider   naproxen (NAPROSYN) 500 MG tablet Take 1 tablet (500 mg total) by mouth 2 (two) times daily. 30 tablet Mattheus Rauls, Elesa Hacker, PA-C     PDMP  not reviewed this encounter.   Janith Lima, PA-C 01/25/20 1248

## 2020-03-06 ENCOUNTER — Ambulatory Visit: Payer: Self-pay

## 2020-06-19 ENCOUNTER — Encounter (HOSPITAL_COMMUNITY): Payer: Self-pay

## 2020-06-19 ENCOUNTER — Ambulatory Visit (HOSPITAL_COMMUNITY)
Admission: EM | Admit: 2020-06-19 | Discharge: 2020-06-19 | Disposition: A | Discharge status: Home / Self Care | Payer: Self-pay | Attending: Family Medicine | Admitting: Family Medicine

## 2020-06-19 ENCOUNTER — Other Ambulatory Visit: Payer: Self-pay

## 2020-06-19 DIAGNOSIS — L299 Pruritus, unspecified: Secondary | ICD-10-CM

## 2020-06-19 MED ORDER — PERMETHRIN 5 % EX CREA: TOPICAL_CREAM | CUTANEOUS | 1 refills | Status: DC

## 2020-06-19 NOTE — Discharge Instructions (Addendum)
You may try over the counter CLARITIN daily for the next 1-2 weeks.

## 2020-06-19 NOTE — ED Triage Notes (Signed)
Pt presents with itchiness all over body X 1 week with no rash from unknown source.

## 2020-06-20 NOTE — ED Provider Notes (Signed)
[place]   [id number] 06/19/20 Arrival Time: 5465  ASSESSMENT & PLAN:  1. Pruritus     Generalized. Unclear etiology.    Discharge Instructions     You may try over the counter CLARITIN daily for the next 1-2 weeks.    Moisturizing lotion to avoid dry skin.  Low susp of scabies but will tx empirically.  Meds ordered this encounter  Medications  . permethrin (ELIMITE) 5 % cream    Sig: Apply from neck down before bed then wash off in the morning. May repeat in one week.    Dispense:  60 g    Refill:  1     Follow-up Information    [facility].   Why: If worsening or failing to improve as anticipated. Contact information: [address] [place] [address]              Reviewed expectations re: course of current medical issues. Questions answered. Outlined signs and symptoms indicating need for more acute intervention. Understanding verbalized. After Visit Summary given.   SUBJECTIVE: History from: patient. Lisa Liu is a 24 y.o. female who reports generalized itching over the past 1 week. No rashes. No new exposures/contacts. No new medications. Afebrile. Normal PO intake without n/v/d.    OBJECTIVE:  Vitals:   06/19/20 1618  BP: 121/80  Pulse: 97  Resp: 17  Temp: 97.7 F (36.5 C)  TempSrc: Oral  SpO2: 97%    General appearance: alert; no distress Eyes: PERRLA; EOMI; conjunctiva normal HENT: [place]; AT; without nasal congestion Neck: supple  Lungs: speaks full sentences without difficulty; unlabored Extremities: no edema Skin: warm and dry; no rashes Neurologic: normal gait Psychological: alert and cooperative; normal mood and affect   No Known Allergies  Past Medical History:  Diagnosis Date  . Fibroids   . Gestational hypertension 05/12/2017  . Insufficient weight gain during pregnancy 04/02/2017   Patient states that she is still throwing up after each meal;  she has lost weight since she became pregnant.    Social History   Socioeconomic History  . Marital status: Married    Spouse name: Not on file  . Number of children: Not on file  . Years of education: Not on file  . Highest education level: Not on file  Occupational History  . Not on file  Tobacco Use  . Smoking status: Never Smoker  . Smokeless tobacco: Never Used  Vaping Use  . Vaping Use: Never used  Substance and Sexual Activity  . Alcohol use: No  . Drug use: No  . Sexual activity: Yes    Birth control/protection: None  Other Topics Concern  . Not on file  Social History Narrative  . Not on file   Social Determinants of Health   Financial Resource Strain:   . Difficulty of Paying Living Expenses: Not on file  Food Insecurity:   . Worried About Charity fundraiser in the Last Year: Not on file  . Ran Out of Food in the Last Year: Not on file  Transportation Needs:   . Lack of Transportation (Medical): Not on file  . Lack of Transportation (Non-Medical): Not on file  Physical Activity:   . Days of Exercise per Week: Not on file  . Minutes of Exercise per Session: Not on file  Stress:   . Feeling of Stress : Not on file  Social Connections:   . Frequency of Communication  with Friends and Family: Not on file  . Frequency of Social Gatherings with Friends and Family: Not on file  . Attends Religious Services: Not on file  . Active Member of Clubs or Organizations: Not on file  . Attends Archivist Meetings: Not on file  . Marital Status: Not on file  Intimate Partner Violence:   . Fear of Current or Ex-Partner: Not on file  . Emotionally Abused: Not on file  . Physically Abused: Not on file  . Sexually Abused: Not on file   Family History  Problem Relation Age of Onset  . Cancer Father   . Varicose Veins Father   . Diabetes Paternal Grandmother   . Hyperlipidemia Paternal Grandmother    History reviewed. No pertinent surgical history.    Vanessa Kick, MD 06/20/20 [phone]

## 2020-11-28 ENCOUNTER — Emergency Department (HOSPITAL_COMMUNITY)
Admission: EM | Admit: 2020-11-28 | Discharge: 2020-11-28 | Disposition: A | Discharge status: Home / Self Care | Payer: Self-pay | Attending: Emergency Medicine | Admitting: Emergency Medicine

## 2020-11-28 ENCOUNTER — Other Ambulatory Visit: Payer: Self-pay

## 2020-11-28 ENCOUNTER — Emergency Department (HOSPITAL_COMMUNITY): Payer: Self-pay

## 2020-11-28 ENCOUNTER — Encounter (HOSPITAL_COMMUNITY): Payer: Self-pay

## 2020-11-28 DIAGNOSIS — M25562 Pain in left knee: Secondary | ICD-10-CM | POA: Insufficient documentation

## 2020-11-28 MED ORDER — ACETAMINOPHEN 325 MG PO TABS
650.0000 mg | ORAL_TABLET | Freq: Once | ORAL | Status: DC
  Filled 2020-11-28: qty 2

## 2020-11-28 NOTE — ED Notes (Signed)
An After Visit Summary was printed and given to the patient. Discharge instructions given and no further questions at this time.  

## 2020-11-28 NOTE — ED Triage Notes (Signed)
Pt c/o left knee pain for last few weeks. Pt states its been getting progressively worse. Pt denies injury/trauma. Pt ambulatory.

## 2020-11-28 NOTE — Discharge Instructions (Addendum)
  You were evaluated in the Emergency Department and after careful evaluation, we did not find any emergent condition requiring admission or further testing in the hospital.   Your exam/testing today was overall reassuring.  Symptoms seem to be due to possibly a muscle strain to the knee, I want you to start taking Tylenol directed on the bottle for pain.  I also want to follow-up with orthopedic doctor, their information is provided above. Please return to the Emergency Department if you experience any worsening of your condition.  Thank you for allowing [place] to be a part of your care. Please speak to your pharmacist about any new medications prescribed today in regards to side effects or interactions with other medications.

## 2020-11-28 NOTE — ED Provider Notes (Signed)
[facility] DEPT Provider Note   CSN: [id number] Arrival date & time: 11/28/20  1817     History Chief Complaint  Patient presents with  . Left Knee Pain    Lisa Liu is a 25 y.o. female with no pertinent past medical history that presents the emerge department today for left knee pain.  Patient states that she has had this left knee pain for the past couple of weeks, has been worsening. Pain does not radiate anywhere, feels like a aching sensation.Patient states that pain is worse when she stands or walks.  Is able to walk.  Denies any trauma or injury to the area.  Denies any hip pain or ankle pain.  Denies any swelling or redness or warmth to the knee.  Denies any fevers, nausea or vomiting.  States that this is never occurred before.  Patient states that she is constantly on her feet, works as a Theme park manager.  No other complaints at this time.  Patient has not been taking anything for this. HPI     Past Medical History:  Diagnosis Date  . Fibroids   . Gestational hypertension 05/12/2017  . Insufficient weight gain during pregnancy 04/02/2017   Patient states that she is still throwing up after each meal; she has lost weight since she became pregnant.     Patient Active Problem List   Diagnosis Date Noted  . Fibroid uterus 07/18/2016    History reviewed. No pertinent surgical history.   OB History    Gravida  2   Para  1   Term  1   Preterm      AB  1   Living  1     SAB  1   IAB      Ectopic      Multiple  0   Live Births  1           Family History  Problem Relation Age of Onset  . Cancer Father   . Varicose Veins Father   . Diabetes Paternal Grandmother   . Hyperlipidemia Paternal Grandmother     Social History   Tobacco Use  . Smoking status: Never Smoker  . Smokeless tobacco: Never Used  Vaping Use  . Vaping Use: Never used  Substance Use Topics  . Alcohol use: No  . Drug use: No     Home Medications Prior to Admission medications   Medication Sig Start Date End Date Taking? Authorizing Provider  naproxen (NAPROSYN) 500 MG tablet Take 1 tablet (500 mg total) by mouth 2 (two) times daily. 01/24/20   Wieters, Hallie C, PA-C  permethrin (ELIMITE) 5 % cream Apply from neck down before bed then wash off in the morning. May repeat in one week. 06/19/20   Vanessa Kick, MD    Allergies    Patient has no known allergies.  Review of Systems   Review of Systems  Constitutional: Negative for diaphoresis, fatigue and fever.  Eyes: Negative for visual disturbance.  Respiratory: Negative for shortness of breath.   Cardiovascular: Negative for chest pain.  Gastrointestinal: Negative for nausea and vomiting.  Musculoskeletal: Positive for arthralgias. Negative for back pain and myalgias.  Skin: Negative for color change, pallor, rash and wound.  Neurological: Negative for syncope, weakness, light-headedness, numbness and headaches.  Psychiatric/Behavioral: Negative for behavioral problems and confusion.    Physical Exam Updated Vital Signs BP 122/80   Pulse 90   Temp 98.2 F (36.8 C)   Resp  18   LMP 11/14/2020 (Approximate)   SpO2 99%   Physical Exam Constitutional:      General: She is not in acute distress.    Appearance: Normal appearance. She is not ill-appearing, toxic-appearing or diaphoretic.  Cardiovascular:     Rate and Rhythm: Normal rate and regular rhythm.     Pulses: Normal pulses.  Pulmonary:     Effort: Pulmonary effort is normal.     Breath sounds: Normal breath sounds.  Musculoskeletal:        General: Normal range of motion.     Right knee: Normal.     Left knee: Normal. No swelling, deformity, effusion, erythema, ecchymosis, lacerations or crepitus. Normal range of motion. No tenderness. No LCL laxity, MCL laxity, ACL laxity or PCL laxity.Normal pulse.     Instability Tests: Anterior drawer test negative. Posterior drawer test negative.        Legs:     Comments: Patient with tenderness to left knee, only upon standing.  No edema, erythema or warmth.  Normal range of motion to knee, no tenderness to palpation when patient is sitting.  Normal range of motion to hip and ankle as well.  Compartments are soft.  Normal leg raise.  PT pulses 2+.  Normal sensation.  Normal laxity test of knee.  Skin:    General: Skin is warm and dry.     Capillary Refill: Capillary refill takes less than 2 seconds.  Neurological:     General: No focal deficit present.     Mental Status: She is alert and oriented to person, place, and time.  Psychiatric:        Mood and Affect: Mood normal.        Behavior: Behavior normal.        Thought Content: Thought content normal.     ED Results / Procedures / Treatments   Labs (all labs ordered are listed, but only abnormal results are displayed) Labs Reviewed - No data to display  EKG None  Radiology DG Knee Complete 4 Views Left  Result Date: 11/28/2020 CLINICAL DATA:  Left knee pain for a few weeks, progressively worsening. No recent injury. EXAM: LEFT KNEE - COMPLETE 4+ VIEW COMPARISON:  None. FINDINGS: No evidence of fracture, dislocation, or joint effusion. No evidence of arthropathy or other focal bone abnormality. Soft tissues are unremarkable. IMPRESSION: Negative. Electronically Signed   By: Lucienne Capers M.D.   On: 11/28/2020 19:54    Procedures Procedures   Medications Ordered in ED Medications  acetaminophen (TYLENOL) tablet 650 mg (650 mg Oral Patient Refused/Not Given 11/28/20 1938)    ED Course  I have reviewed the triage vital signs and the nursing notes.  Pertinent labs & imaging results that were available during my care of the patient were reviewed by me and considered in my medical decision making (see chart for details).    MDM Rules/Calculators/A&P                           Lisa Liu is a 25 y.o. female with no pertinent past medical history that  presents the emerge department today for left knee pain.  Knee without any obvious trauma, normal laxity test and normal strength to knee.  No concerns of septic joint or DVT.  Patient is distally neurovascularly intact.  Plain films are negative, no obvious reason for knee pain, will refer to orthopedics.  Symptomatic treatment discussed.  Doubt need for  further emergent work up at this time. I explained the diagnosis and have given explicit precautions to return to the ER including for any other new or worsening symptoms. The patient understands and accepts the medical plan as it's been dictated and I have answered their questions. Discharge instructions concerning home care and prescriptions have been given. The patient is STABLE and is discharged to home in good condition.   Final Clinical Impression(s) / ED Diagnoses Final diagnoses:  Acute pain of left knee    Rx / DC Orders ED Discharge Orders    None       Alfredia Client, PA-C 11/28/20 2003    [address] DO 11/28/20 2012

## 2021-03-14 ENCOUNTER — Other Ambulatory Visit: Payer: Self-pay

## 2021-03-14 ENCOUNTER — Ambulatory Visit (HOSPITAL_COMMUNITY)
Admission: EM | Admit: 2021-03-14 | Discharge: 2021-03-14 | Disposition: A | Discharge status: Home / Self Care | Payer: Self-pay | Attending: Emergency Medicine | Admitting: Emergency Medicine

## 2021-03-14 ENCOUNTER — Encounter (HOSPITAL_COMMUNITY): Payer: Self-pay | Admitting: Emergency Medicine

## 2021-03-14 DIAGNOSIS — N939 Abnormal uterine and vaginal bleeding, unspecified: Secondary | ICD-10-CM

## 2021-03-14 LAB — CBC
HCT: 33.1 % — ABNORMAL LOW (ref 36.0–46.0)
Hemoglobin: 10.4 g/dL — ABNORMAL LOW (ref 12.0–15.0)
MCH: 26.3 pg (ref 26.0–34.0)
MCHC: 31.4 g/dL (ref 30.0–36.0)
MCV: 83.6 fL (ref 80.0–100.0)
Platelets: 345 10*3/uL (ref 150–400)
RBC: 3.96 MIL/uL (ref 3.87–5.11)
RDW: 14.9 % (ref 11.5–15.5)
WBC: 4 10*3/uL (ref 4.0–10.5)
nRBC: 0 % (ref 0.0–0.2)

## 2021-03-14 LAB — POCT URINALYSIS DIPSTICK, ED / UC
Bilirubin Urine: NEGATIVE
Glucose, UA: NEGATIVE mg/dL
Ketones, ur: NEGATIVE mg/dL
Leukocytes,Ua: NEGATIVE
Nitrite: NEGATIVE
Protein, ur: NEGATIVE mg/dL
Specific Gravity, Urine: >=1.03 (ref 1.005–1.030)
Urobilinogen, UA: 0.2 mg/dL (ref 0.0–1.0)
pH: 5.5 (ref 5.0–8.0)

## 2021-03-14 LAB — POC URINE PREG, ED: Preg Test, Ur: NEGATIVE

## 2021-03-14 NOTE — ED Provider Notes (Signed)
[facility]    CSN: [id number] Arrival date & time: 03/14/21  0843      History   Chief Complaint Chief Complaint  Patient presents with   Abdominal Pain    HPI Lisa Liu is a 25 y.o. female.   Patient here for abnormal vaginal bleeding for the past 10 days.  Reports period has lasted longer than normal and been heavier than normal for the past 2 months.  Does have a history of uterine fibroids.  Has not been evaluated by GYN outside of pregnancy.  Denies any trauma, injury, or other precipitating event.  Denies any specific alleviating or aggravating factors.  Denies any fevers, chest pain, shortness of breath, N/V/D, numbness, tingling, weakness, abdominal pain, or headaches.    The history is provided by the patient.  Abdominal Pain Associated symptoms: vaginal bleeding    Past Medical History:  Diagnosis Date   Fibroids    Gestational hypertension 05/12/2017   Insufficient weight gain during pregnancy 04/02/2017   Patient states that she is still throwing up after each meal; she has lost weight since she became pregnant.     Patient Active Problem List   Diagnosis Date Noted   Fibroid uterus 07/18/2016    History reviewed. No pertinent surgical history.  OB History     Gravida  2   Para  1   Term  1   Preterm      AB  1   Living  1      SAB  1   IAB      Ectopic      Multiple  0   Live Births  1            Home Medications    Prior to Admission medications   Medication Sig Start Date End Date Taking? Authorizing Provider  naproxen (NAPROSYN) 500 MG tablet Take 1 tablet (500 mg total) by mouth 2 (two) times daily. Patient not taking: Reported on 03/14/2021 01/24/20   Wieters, Madelynn Done C, PA-C  permethrin (ELIMITE) 5 % cream Apply from neck down before bed then wash off in the morning. May repeat in one week. Patient not taking: Reported on 03/14/2021 06/19/20   Vanessa Kick, MD    Family History Family History   Problem Relation Age of Onset   Cancer Father    Varicose Veins Father    Diabetes Paternal Grandmother    Hyperlipidemia Paternal Grandmother     Social History Social History   Tobacco Use   Smoking status: Never   Smokeless tobacco: Never  Vaping Use   Vaping Use: Never used  Substance Use Topics   Alcohol use: No   Drug use: No     Allergies   Patient has no known allergies.   Review of Systems Review of Systems  Gastrointestinal:  Positive for abdominal pain.  Genitourinary:  Positive for menstrual problem and vaginal bleeding.  All other systems reviewed and are negative.   Physical Exam Triage Vital Signs ED Triage Vitals  Enc Vitals Group     BP 03/14/21 0933 106/64     Pulse Rate 03/14/21 0933 77     Resp 03/14/21 0933 16     Temp 03/14/21 0933 98.4 F (36.9 C)     Temp Source 03/14/21 0933 Oral     SpO2 03/14/21 0933 100 %     Weight --      Height --      Head Circumference --  Peak Flow --      Pain Score 03/14/21 0930 6     Pain Loc --      Pain Edu? --      Excl. in [place]? --    No data found.  Updated Vital Signs BP 106/64 (BP Location: Right Arm)   Pulse 77   Temp 98.4 F (36.9 C) (Oral)   Resp 16   LMP 03/04/2021   SpO2 100%   Visual Acuity Right Eye Distance:   Left Eye Distance:   Bilateral Distance:    Right Eye Near:   Left Eye Near:    Bilateral Near:     Physical Exam Vitals and nursing note reviewed.  Constitutional:      General: She is not in acute distress.    Appearance: Normal appearance. She is not ill-appearing, toxic-appearing or diaphoretic.  HENT:     Head: Normocephalic and atraumatic.  Eyes:     Conjunctiva/sclera: Conjunctivae normal.  Cardiovascular:     Rate and Rhythm: Normal rate.     Pulses: Normal pulses.  Pulmonary:     Effort: Pulmonary effort is normal.  Abdominal:     General: Abdomen is flat. Bowel sounds are normal.     Palpations: Abdomen is soft.     Tenderness: There is no  abdominal tenderness.     Hernia: No hernia is present.  Musculoskeletal:        General: Normal range of motion.     Cervical back: Normal range of motion.  Skin:    General: Skin is warm and dry.  Neurological:     General: No focal deficit present.     Mental Status: She is alert and oriented to person, place, and time.  Psychiatric:        Mood and Affect: Mood normal.     UC Treatments / Results  Labs (all labs ordered are listed, but only abnormal results are displayed) Labs Reviewed  POCT URINALYSIS DIPSTICK, ED / UC - Abnormal; Notable for the following components:      Result Value   Hgb urine dipstick LARGE (*)    All other components within normal limits  CBC  POC URINE PREG, ED    EKG   Radiology No results found.  Procedures Procedures (including critical care time)  Medications Ordered in UC Medications - No data to display  Initial Impression / Assessment and Plan / UC Course  I have reviewed the triage vital signs and the nursing notes.  Pertinent labs & imaging results that were available during my care of the patient were reviewed by me and considered in my medical decision making (see chart for details).    Assessment negative for red flags or concerns.  Urine positive for hbg but otherwise negative.  Pregnancy test negative.  Will obtain CBC to ensure adequate hemoglobin.  If hgb is low, patient will need to go to the ED for further evaluation.  Otherwise, recommend follow up with GYN for abnormal uterine bleeding, possibly due to fibroids.  Verbalized understanding.   Final Clinical Impressions(s) / UC Diagnoses   Final diagnoses:  Abnormal vaginal bleeding     Discharge Instructions      We will contact you if your blood work is abnormal and will require you to go to the ED.   Follow up with the [facility] as soon as possible for re-evaluation of abnormal bleeding.       ED Prescriptions   None  PDMP not  reviewed this encounter.   Pearson Forster, NP 03/14/21 1014

## 2021-03-14 NOTE — ED Triage Notes (Signed)
Abdominal pain, vaginal bleeding for 10 days .

## 2021-03-14 NOTE — ED Notes (Signed)
Attempt to call from lobby. 

## 2021-03-14 NOTE — Discharge Instructions (Addendum)
We will contact you if your blood work is abnormal and will require you to go to the ED.   Follow up with the [facility] as soon as possible for re-evaluation of abnormal bleeding.

## 2021-04-04 ENCOUNTER — Encounter: Payer: Self-pay | Admitting: Obstetrics and Gynecology

## 2021-06-23 ENCOUNTER — Other Ambulatory Visit: Payer: Self-pay

## 2021-06-23 ENCOUNTER — Encounter (HOSPITAL_COMMUNITY): Payer: Self-pay | Admitting: Emergency Medicine

## 2021-06-23 ENCOUNTER — Ambulatory Visit (HOSPITAL_COMMUNITY)
Admission: EM | Admit: 2021-06-23 | Discharge: 2021-06-23 | Disposition: A | Discharge status: Home / Self Care | Payer: Medicaid Other | Attending: Physician Assistant | Admitting: Physician Assistant

## 2021-06-23 DIAGNOSIS — N926 Irregular menstruation, unspecified: Secondary | ICD-10-CM

## 2021-06-23 DIAGNOSIS — Z3201 Encounter for pregnancy test, result positive: Secondary | ICD-10-CM | POA: Diagnosis not present

## 2021-06-23 DIAGNOSIS — R1030 Lower abdominal pain, unspecified: Secondary | ICD-10-CM

## 2021-06-23 LAB — POCT URINALYSIS DIPSTICK, ED / UC
Bilirubin Urine: NEGATIVE
Glucose, UA: NEGATIVE mg/dL
Hgb urine dipstick: NEGATIVE
Ketones, ur: NEGATIVE mg/dL
Leukocytes,Ua: NEGATIVE
Nitrite: NEGATIVE
Protein, ur: NEGATIVE mg/dL
Specific Gravity, Urine: >=1.03 (ref 1.005–1.030)
Urobilinogen, UA: 0.2 mg/dL (ref 0.0–1.0)
pH: 5.5 (ref 5.0–8.0)

## 2021-06-23 LAB — POC URINE PREG, ED: Preg Test, Ur: POSITIVE — AB

## 2021-06-23 MED ORDER — PRENATAL COMPLETE 14-0.4 MG PO TABS: 1.0000 | ORAL_TABLET | Freq: Every day | ORAL | 0 refills | Status: DC

## 2021-06-23 NOTE — ED Triage Notes (Signed)
Low abdominal cramping for 2 weeks.  Denies vaginal bleeding. LMP 9/8  Patient did a pregnancy test last month and it was negative.  Denies urinary symptoms

## 2021-06-23 NOTE — Discharge Instructions (Addendum)
Your pregnancy test was positive.  I have called in a prenatal vitamin that you need to start daily.  It is very important that you follow-up with an OB/GYN so please call schedule appointment as soon as possible.  Do not take over-the-counter medications such as NSAIDs (can take limited amount of Tylenol but should limit all medication in the first trimester).  Make sure that all of your food is properly cooked.  General treating that raises your core body temperature.  If you have any abdominal pain, pelvic cramping, abnormal bleeding you must go to the emergency room.

## 2021-06-23 NOTE — ED Provider Notes (Signed)
[place]    CSN: [id number] Arrival date & time: 06/23/21  1426      History   Chief Complaint Chief Complaint  Patient presents with   Abdominal Pain    HPI Mikea Quadros is a 25 y.o. female.   Patient presents today with a 2-week history of intermittent lower abdominal cramping.  Reports this is mild and intermittent without identifiable trigger.  During episodes pain is rated 4/5 on a 0-10 pain scale, described as cramping, no aggravating alleviating factors identified.  She does have a history of fibroids but reports these no typically cause pain.  She is late for her menstrual cycle which was last 04/11/2021.  She did a pregnancy test last month that was negative but is not taking additional and since that time.  She is not currently on birth control.  She denies any nausea, vomiting, diarrhea, changes in bowel habits, urinary symptoms.  She does not take medication on a regular basis and has not taken anything for symptoms.  She does report associated breast tenderness.   Past Medical History:  Diagnosis Date   Fibroids    Gestational hypertension 05/12/2017   Insufficient weight gain during pregnancy 04/02/2017   Patient states that she is still throwing up after each meal; she has lost weight since she became pregnant.     Patient Active Problem List   Diagnosis Date Noted   Fibroid uterus 07/18/2016    History reviewed. No pertinent surgical history.  OB History     Gravida  2   Para  1   Term  1   Preterm      AB  1   Living  1      SAB  1   IAB      Ectopic      Multiple  0   Live Births  1            Home Medications    Prior to Admission medications   Medication Sig Start Date End Date Taking? Authorizing Provider  Prenatal Vit-Fe Fumarate-FA (PRENATAL COMPLETE) 14-0.4 MG TABS Take 1 tablet by mouth daily. 06/23/21  Yes Samaiya Awadallah, Derry Skill, PA-C    Family History Family History  Problem Relation Age of Onset    Healthy Mother    Varicose Veins Father    Diabetes Paternal Grandmother    Hyperlipidemia Paternal Grandmother     Social History Social History   Tobacco Use   Smoking status: Never   Smokeless tobacco: Never  Vaping Use   Vaping Use: Never used  Substance Use Topics   Alcohol use: No   Drug use: No     Allergies   Patient has no known allergies.   Review of Systems Review of Systems  Constitutional:  Negative for activity change, appetite change, fatigue and fever.  Respiratory:  Negative for cough and shortness of breath.   Cardiovascular:  Negative for chest pain.  Gastrointestinal:  Positive for abdominal pain. Negative for diarrhea, nausea and vomiting.  Genitourinary:  Negative for dysuria, frequency, urgency, vaginal bleeding, vaginal discharge and vaginal pain.  Musculoskeletal:  Negative for arthralgias and myalgias.  Neurological:  Negative for dizziness, light-headedness and headaches.    Physical Exam Triage Vital Signs ED Triage Vitals  Enc Vitals Group     BP 06/23/21 1655 116/69     Pulse Rate 06/23/21 1655 87     Resp 06/23/21 1655 18     Temp 06/23/21 1655 98.3  F (36.8 C)     Temp Source 06/23/21 1655 Oral     SpO2 06/23/21 1655 100 %     Weight --      Height --      Head Circumference --      Peak Flow --      Pain Score 06/23/21 1652 7     Pain Loc --      Pain Edu? --      Excl. in [place]? --    No data found.  Updated Vital Signs BP 116/69 (BP Location: Right Arm)   Pulse 87   Temp 98.3 F (36.8 C) (Oral)   Resp 18   LMP 04/11/2021   SpO2 100%   Visual Acuity Right Eye Distance:   Left Eye Distance:   Bilateral Distance:    Right Eye Near:   Left Eye Near:    Bilateral Near:     Physical Exam Vitals reviewed.  Constitutional:      General: She is awake. She is not in acute distress.    Appearance: Normal appearance. She is well-developed. She is not ill-appearing.     Comments: Very pleasant female appears stated age  in no acute distress  HENT:     Head: Normocephalic and atraumatic.  Cardiovascular:     Rate and Rhythm: Normal rate and regular rhythm.     Heart sounds: Normal heart sounds, S1 normal and S2 normal. No murmur heard. Pulmonary:     Effort: Pulmonary effort is normal.     Breath sounds: Normal breath sounds. No wheezing, rhonchi or rales.     Comments: Clear to auscultation bilaterally Abdominal:     General: Bowel sounds are normal.     Palpations: Abdomen is soft.     Tenderness: There is no abdominal tenderness. There is no right CVA tenderness, left CVA tenderness, guarding or rebound.     Comments: No abdominal pain.  Fundus noted on exam in suprapubic region below epigastrium.  Psychiatric:        Behavior: Behavior is cooperative.     UC Treatments / Results  Labs (all labs ordered are listed, but only abnormal results are displayed) Labs Reviewed  POC URINE PREG, ED - Abnormal; Notable for the following components:      Result Value   Preg Test, Ur POSITIVE (*)    All other components within normal limits  POCT URINALYSIS DIPSTICK, ED / UC    EKG   Radiology No results found.  Procedures Procedures (including critical care time)  Medications Ordered in UC Medications - No data to display  Initial Impression / Assessment and Plan / UC Course  I have reviewed the triage vital signs and the nursing notes.  Pertinent labs & imaging results that were available during my care of the patient were reviewed by me and considered in my medical decision making (see chart for details).     Discussed positive pregnancy test with patient in clinic today.  This is likely cause of her symptoms.  Discussed that she should not take any over-the-counter medication.  She is to start a prenatal vitamin, sent to pharmacy.  Discussed that she needs to avoid raising her core body temperature.  Discussed the importance of abstaining from alcohol, cigarettes, illicit drugs.  She must  have all meals properly cooked.  Discussed the importance of following up with OB/GYN and she was given contact information for local provider.  Discussed alarm symptoms that warrant going to  the emergency room including vaginal bleeding or increased pain.  Strict return precautions given to which she expressed understanding.  Final Clinical Impressions(s) / UC Diagnoses   Final diagnoses:  Lower abdominal pain  Missed menses  Positive pregnancy test     Discharge Instructions      Your pregnancy test was positive.  I have called in a prenatal vitamin that you need to start daily.  It is very important that you follow-up with an OB/GYN so please call schedule appointment as soon as possible.  Do not take over-the-counter medications such as NSAIDs (can take limited amount of Tylenol but should limit all medication in the first trimester).  Make sure that all of your food is properly cooked.  General treating that raises your core body temperature.  If you have any abdominal pain, pelvic cramping, abnormal bleeding you must go to the emergency room.     ED Prescriptions     Medication Sig Dispense Auth. Provider   Prenatal Vit-Fe Fumarate-FA (PRENATAL COMPLETE) 14-0.4 MG TABS Take 1 tablet by mouth daily. 60 tablet Zohar Maroney, Derry Skill, PA-C      PDMP not reviewed this encounter.   Terrilee Croak, PA-C 06/23/21 1710

## 2021-07-01 ENCOUNTER — Telehealth: Payer: Self-pay | Admitting: Family Medicine

## 2021-07-01 NOTE — Telephone Encounter (Signed)
Patient called after going to urgent care and receiving a positive pregnancy test, she states her last menstrual cycle was 04/11/2021 but tested negative following that cycle, then tested positive at the urgent care. She is not sure how far along she is so she will need an ultra sound prior to getting her scheduled for OB care.

## 2021-07-01 NOTE — Telephone Encounter (Signed)
Call placed to pt. Pt did not answer. Pt was ID on VM, Left VM for pt to return call to schedule nurse visit for UPT confirmation and possible early [place].  Colletta Maryland, RN

## 2021-07-02 NOTE — Telephone Encounter (Signed)
Called patient stating I am returning her phone call. Patient states she talked to someone yesterday and was waiting to hear back about an ultrasound. She reports history of missing periods and recently had a positive UPT but is uncertain of due date. Per chart review, patient went to [facility] office in the past. Patient states she plans to return there this pregnancy. Advised patient to reach out to them and they can provide further assistance with scheduling. Patient verbalized understanding.

## 2021-07-04 ENCOUNTER — Encounter (HOSPITAL_COMMUNITY): Payer: Self-pay

## 2021-07-04 ENCOUNTER — Emergency Department (HOSPITAL_COMMUNITY)
Admission: EM | Admit: 2021-07-04 | Discharge: 2021-07-04 | Disposition: A | Discharge status: Home / Self Care | Payer: Medicaid Other | Attending: Emergency Medicine | Admitting: Emergency Medicine

## 2021-07-04 ENCOUNTER — Emergency Department (HOSPITAL_COMMUNITY): Payer: Medicaid Other

## 2021-07-04 ENCOUNTER — Other Ambulatory Visit: Payer: Self-pay

## 2021-07-04 DIAGNOSIS — O26891 Other specified pregnancy related conditions, first trimester: Secondary | ICD-10-CM | POA: Diagnosis present

## 2021-07-04 DIAGNOSIS — Z3A01 Less than 8 weeks gestation of pregnancy: Secondary | ICD-10-CM | POA: Insufficient documentation

## 2021-07-04 DIAGNOSIS — O039 Complete or unspecified spontaneous abortion without complication: Secondary | ICD-10-CM | POA: Diagnosis not present

## 2021-07-04 LAB — CBC WITH DIFFERENTIAL/PLATELET
Abs Immature Granulocytes: 0.03 10*3/uL (ref 0.00–0.07)
Basophils Absolute: 0 10*3/uL (ref 0.0–0.1)
Basophils Relative: 0 %
Eosinophils Absolute: 0.1 10*3/uL (ref 0.0–0.5)
Eosinophils Relative: 2 %
HCT: 38.1 % (ref 36.0–46.0)
Hemoglobin: 12.2 g/dL (ref 12.0–15.0)
Immature Granulocytes: 1 %
Lymphocytes Relative: 17 %
Lymphs Abs: 1.1 10*3/uL (ref 0.7–4.0)
MCH: 26.5 pg (ref 26.0–34.0)
MCHC: 32 g/dL (ref 30.0–36.0)
MCV: 82.8 fL (ref 80.0–100.0)
Monocytes Absolute: 0.6 10*3/uL (ref 0.1–1.0)
Monocytes Relative: 10 %
Neutro Abs: 4.3 10*3/uL (ref 1.7–7.7)
Neutrophils Relative %: 70 %
Platelets: 302 10*3/uL (ref 150–400)
RBC: 4.6 MIL/uL (ref 3.87–5.11)
RDW: 21.6 % — ABNORMAL HIGH (ref 11.5–15.5)
WBC: 6.2 10*3/uL (ref 4.0–10.5)
nRBC: 0 % (ref 0.0–0.2)

## 2021-07-04 LAB — HCG, QUANTITATIVE, PREGNANCY: hCG, Beta Chain, Quant, S: 91 m[IU]/mL — ABNORMAL HIGH (ref <5)

## 2021-07-04 LAB — BASIC METABOLIC PANEL
Anion gap: 5 (ref 5–15)
BUN: 5 mg/dL — ABNORMAL LOW (ref 6–20)
CO2: 24 mmol/L (ref 22–32)
Calcium: 8.6 mg/dL — ABNORMAL LOW (ref 8.9–10.3)
Chloride: 107 mmol/L (ref 98–111)
Creatinine, Ser: 0.72 mg/dL (ref 0.44–1.00)
GFR, Estimated: >60 mL/min (ref >60)
Glucose, Bld: 95 mg/dL (ref 70–99)
Potassium: 4 mmol/L (ref 3.5–5.1)
Sodium: 136 mmol/L (ref 135–145)

## 2021-07-04 MED ORDER — IBUPROFEN 600 MG PO TABS: 600.0000 mg | ORAL_TABLET | Freq: Four times a day (QID) | ORAL | 0 refills | Status: DC | PRN

## 2021-07-04 MED ORDER — IBUPROFEN 800 MG PO TABS: 800.0000 mg | ORAL_TABLET | Freq: Once | ORAL | Status: DC

## 2021-07-04 NOTE — ED Triage Notes (Signed)
Pt states she is approximately 7-[redacted] weeks pregnant. Reports she noticed some light vaginal bleeding this morning while using the restroom.

## 2021-07-04 NOTE — Discharge Instructions (Addendum)
You will need to [facility] next week for a repeat hormone level.  Call today when you get home for an appointment.

## 2021-07-04 NOTE — ED Provider Notes (Signed)
[place] DEPT Provider Note   CSN: [id number] Arrival date & time: 07/04/21  3343     History Chief Complaint  Patient presents with   Vaginal Bleeding   pregnancy    Lisa Liu is a 25 y.o. female.  Pt presents to the ED today with vaginal bleeding.  Pt has had intermittent lower abdominal cramping.  She has a hx of fibroids.  She had a late menstrual cycle in September.  Pregnancy test was neg lat month.  She did present to UC on 11/20 and had a + pregnancy test.  She started having some bleeding this morning.  She has changed her pad one time while waiting (about 4 hrs).        Past Medical History:  Diagnosis Date   Fibroids    Gestational hypertension 05/12/2017   Insufficient weight gain during pregnancy 04/02/2017   Patient states that she is still throwing up after each meal; she has lost weight since she became pregnant.     Patient Active Problem List   Diagnosis Date Noted   Fibroid uterus 07/18/2016    History reviewed. No pertinent surgical history.   OB History     Gravida  3   Para  1   Term  1   Preterm      AB  1   Living  1      SAB  1   IAB      Ectopic      Multiple  0   Live Births  1           Family History  Problem Relation Age of Onset   Healthy Mother    Varicose Veins Father    Diabetes Paternal Grandmother    Hyperlipidemia Paternal Grandmother     Social History   Tobacco Use   Smoking status: Never   Smokeless tobacco: Never  Vaping Use   Vaping Use: Never used  Substance Use Topics   Alcohol use: No   Drug use: No    Home Medications Prior to Admission medications   Medication Sig Start Date End Date Taking? Authorizing Provider  ibuprofen (ADVIL) 600 MG tablet Take 1 tablet (600 mg total) by mouth every 6 (six) hours as needed. 07/04/21  Yes Isla Pence, MD  Prenatal Vit-Fe Fumarate-FA (PRENATAL COMPLETE) 14-0.4 MG TABS Take 1 tablet by mouth  daily. 06/23/21   Raspet, Derry Skill, PA-C    Allergies    Patient has no known allergies.  Review of Systems   Review of Systems  Gastrointestinal:  Positive for abdominal pain.  Genitourinary:  Positive for vaginal bleeding.  All other systems reviewed and are negative.  Physical Exam Updated Vital Signs BP 114/75   Pulse 87   Temp 98.7 F (37.1 C) (Oral)   Resp 16   LMP 04/11/2021   SpO2 100%   Physical Exam Vitals and nursing note reviewed.  Constitutional:      Appearance: Normal appearance.  HENT:     Head: Normocephalic and atraumatic.     Right Ear: External ear normal.     Left Ear: External ear normal.     Nose: Nose normal.     Mouth/Throat:     Mouth: Mucous membranes are moist.     Pharynx: Oropharynx is clear.  Eyes:     Extraocular Movements: Extraocular movements intact.     Conjunctiva/sclera: Conjunctivae normal.     Pupils: Pupils are equal, round, and  reactive to light.  Cardiovascular:     Rate and Rhythm: Normal rate and regular rhythm.     Pulses: Normal pulses.     Heart sounds: Normal heart sounds.  Pulmonary:     Effort: Pulmonary effort is normal.     Breath sounds: Normal breath sounds.  Abdominal:     General: Abdomen is flat. Bowel sounds are normal.     Palpations: Abdomen is soft.  Musculoskeletal:        General: Normal range of motion.     Cervical back: Normal range of motion and neck supple.  Skin:    General: Skin is warm.     Capillary Refill: Capillary refill takes less than 2 seconds.  Neurological:     General: No focal deficit present.     Mental Status: She is alert and oriented to person, place, and time.  Psychiatric:        Mood and Affect: Mood normal.        Behavior: Behavior normal.    ED Results / Procedures / Treatments   Labs (all labs ordered are listed, but only abnormal results are displayed) Labs Reviewed  BASIC METABOLIC PANEL - Abnormal; Notable for the following components:      Result Value    BUN 5 (*)    Calcium 8.6 (*)    All other components within normal limits  CBC WITH DIFFERENTIAL/PLATELET - Abnormal; Notable for the following components:   RDW 21.6 (*)    All other components within normal limits  HCG, QUANTITATIVE, PREGNANCY - Abnormal; Notable for the following components:   hCG, Beta Chain, Quant, S 91 (*)    All other components within normal limits    EKG None  Radiology US OB Comp < 14 Wks  Result Date: 07/04/2021 CLINICAL DATA:  25 year old female with positive urine pregnancy test. Unknown last menstrual. EXAM: OBSTETRIC <14 WK [place] AND TRANSVAGINAL OB US TECHNIQUE: Both transabdominal and transvaginal ultrasound examinations were performed for complete evaluation of the gestation as well as the maternal uterus, adnexal regions, and pelvic cul-de-sac. Transvaginal technique was performed to assess early pregnancy. COMPARISON:  None. FINDINGS: Intrauterine gestational sac: Not identified Yolk sac:  Not identified Embryo:  Not identified Subchorionic hemorrhage:  None visualized. Maternal uterus/adnexae: Normal sized ovaries. Multiple calcified leiomyoma within the uterine body and serosal surface. Trace free fluid IMPRESSION: 1. No intrauterine pregnancy identified. Trace free fluid. Normal ovaries. 2. No intrauterine gestational sac, yolk sac, or fetal pole identified. Differential considerations include intrauterine pregnancy too early to be sonographically visualized, missed abortion, or ectopic pregnancy. Followup ultrasound is recommended in 10-14 days for further evaluation. Electronically Signed   By: Suzy Bouchard M.D.   On: 07/04/2021 11:22   US OB Transvaginal  Result Date: 07/04/2021 CLINICAL DATA:  25 year old female with positive urine pregnancy test. Unknown last menstrual. EXAM: OBSTETRIC <14 WK [place] AND TRANSVAGINAL OB US TECHNIQUE: Both transabdominal and transvaginal ultrasound examinations were performed for complete evaluation of the gestation as  well as the maternal uterus, adnexal regions, and pelvic cul-de-sac. Transvaginal technique was performed to assess early pregnancy. COMPARISON:  None. FINDINGS: Intrauterine gestational sac: Not identified Yolk sac:  Not identified Embryo:  Not identified Subchorionic hemorrhage:  None visualized. Maternal uterus/adnexae: Normal sized ovaries. Multiple calcified leiomyoma within the uterine body and serosal surface. Trace free fluid IMPRESSION: 1. No intrauterine pregnancy identified. Trace free fluid. Normal ovaries. 2. No intrauterine gestational sac, yolk sac, or fetal pole identified. Differential considerations  include intrauterine pregnancy too early to be sonographically visualized, missed abortion, or ectopic pregnancy. Followup ultrasound is recommended in 10-14 days for further evaluation. Electronically Signed   By: Suzy Bouchard M.D.   On: 07/04/2021 11:22    Procedures Procedures   Medications Ordered in ED Medications  ibuprofen (ADVIL) tablet 800 mg (has no administration in time range)    ED Course  I have reviewed the triage vital signs and the nursing notes.  Pertinent labs & imaging results that were available during my care of the patient were reviewed by me and considered in my medical decision making (see chart for details).    MDM Rules/Calculators/A&P                           Per Epic review, blood type is A+.  Pt's quant is only 46 and no fetus in uterus.  Pt d/w obgyn who recommends that pt come in about a week to get a quant rechecked as they want to follow it to zero.  Pt knows to return if worse.     Final Clinical Impression(s) / ED Diagnoses Final diagnoses:  Miscarriage    Rx / DC Orders ED Discharge Orders          Ordered    ibuprofen (ADVIL) 600 MG tablet  Every 6 hours PRN        07/04/21 1450             Isla Pence, MD 07/04/21 1504

## 2021-08-04 NOTE — L&D Delivery Note (Signed)
[facility]N [facility] Delivery Note  Lisa Liu is a 26 y.o. [id number] s/p VD at [redacted]w[redacted]d She was admitted for IOL for cholestasis of pregnancy .   ROM: 2h 469mith clear fluid GBS Status:  Negative/-- (08/22 1126) Maximum Maternal Temperature:  Temp (24hrs), Avg:98.1 F (36.7 C), Min:97.6 F (36.4 C), Max:98.3 F (36.8 C)    Labor Progress: Patient arrived at 2 cm dilation and was induced with FB, pitocin, AROM.   Delivery Date/Time: 04/13/2022 at 2340 Delivery: Called to room and patient was complete and pushing. Head delivered in ROA position. No nuchal cord present. Right compound hand present. Shoulder and body delivered in usual fashion. Infant with spontaneous cry, placed on mother's abdomen, dried and stimulated. Cord clamped x 2 after 1-minute delay, and cut by FOB. Cord blood drawn. Placenta delivered spontaneously with gentle cord traction. Fundus firm with massage and Pitocin. Labia, perineum, vagina, and cervix inspected with 2nd degree perineal laceration repaired with 3-0 vicryl rapid with hemostasis achieved.    Placenta: spontaneous, intact, 3 vessel cord  Complications: None Lacerations: 2nd degree perineal repaired with 3-0 vicryl rapid suture EBL: 162 Analgesia: epidural    Infant: APGAR (1 MIN): 9   APGAR (5 MINS): 9   APGAR (10 MINS):    Weight: Pending   ViGifford ShaveMD  OB Fellow 04/13/2022 12:10 AM

## 2021-08-22 ENCOUNTER — Other Ambulatory Visit: Payer: Self-pay

## 2021-08-22 ENCOUNTER — Encounter (HOSPITAL_COMMUNITY): Payer: Self-pay | Admitting: Obstetrics & Gynecology

## 2021-08-22 ENCOUNTER — Inpatient Hospital Stay (HOSPITAL_COMMUNITY)
Admission: AD | Admit: 2021-08-22 | Discharge: 2021-08-22 | Disposition: A | Discharge status: Home / Self Care | Payer: Medicaid Other | Attending: Obstetrics & Gynecology | Admitting: Obstetrics & Gynecology

## 2021-08-22 ENCOUNTER — Inpatient Hospital Stay (HOSPITAL_COMMUNITY): Payer: Medicaid Other

## 2021-08-22 DIAGNOSIS — O99011 Anemia complicating pregnancy, first trimester: Secondary | ICD-10-CM

## 2021-08-22 DIAGNOSIS — D259 Leiomyoma of uterus, unspecified: Secondary | ICD-10-CM | POA: Insufficient documentation

## 2021-08-22 DIAGNOSIS — N83201 Unspecified ovarian cyst, right side: Secondary | ICD-10-CM

## 2021-08-22 DIAGNOSIS — O26891 Other specified pregnancy related conditions, first trimester: Secondary | ICD-10-CM | POA: Diagnosis not present

## 2021-08-22 DIAGNOSIS — Z3A01 Less than 8 weeks gestation of pregnancy: Secondary | ICD-10-CM | POA: Diagnosis not present

## 2021-08-22 DIAGNOSIS — O210 Mild hyperemesis gravidarum: Secondary | ICD-10-CM | POA: Insufficient documentation

## 2021-08-22 DIAGNOSIS — R109 Unspecified abdominal pain: Secondary | ICD-10-CM

## 2021-08-22 DIAGNOSIS — Z3490 Encounter for supervision of normal pregnancy, unspecified, unspecified trimester: Secondary | ICD-10-CM

## 2021-08-22 DIAGNOSIS — N83291 Other ovarian cyst, right side: Secondary | ICD-10-CM | POA: Insufficient documentation

## 2021-08-22 DIAGNOSIS — R1030 Lower abdominal pain, unspecified: Secondary | ICD-10-CM | POA: Diagnosis not present

## 2021-08-22 DIAGNOSIS — O3411 Maternal care for benign tumor of corpus uteri, first trimester: Secondary | ICD-10-CM

## 2021-08-22 DIAGNOSIS — O09291 Supervision of pregnancy with other poor reproductive or obstetric history, first trimester: Secondary | ICD-10-CM | POA: Diagnosis not present

## 2021-08-22 DIAGNOSIS — O26899 Other specified pregnancy related conditions, unspecified trimester: Secondary | ICD-10-CM

## 2021-08-22 DIAGNOSIS — O3481 Maternal care for other abnormalities of pelvic organs, first trimester: Secondary | ICD-10-CM | POA: Diagnosis not present

## 2021-08-22 HISTORY — DX: Gestational (pregnancy-induced) hypertension without significant proteinuria, unspecified trimester: O13.9

## 2021-08-22 LAB — WET PREP, GENITAL
Clue Cells Wet Prep HPF POC: NONE SEEN
Sperm: NONE SEEN
Trich, Wet Prep: NONE SEEN
WBC, Wet Prep HPF POC: <10 (ref <10)
Yeast Wet Prep HPF POC: NONE SEEN

## 2021-08-22 LAB — CBC
HCT: 33.1 % — ABNORMAL LOW (ref 36.0–46.0)
Hemoglobin: 10.8 g/dL — ABNORMAL LOW (ref 12.0–15.0)
MCH: 26.3 pg (ref 26.0–34.0)
MCHC: 32.6 g/dL (ref 30.0–36.0)
MCV: 80.7 fL (ref 80.0–100.0)
Platelets: 339 10*3/uL (ref 150–400)
RBC: 4.1 MIL/uL (ref 3.87–5.11)
RDW: 18.8 % — ABNORMAL HIGH (ref 11.5–15.5)
WBC: 5.1 10*3/uL (ref 4.0–10.5)
nRBC: 0 % (ref 0.0–0.2)

## 2021-08-22 LAB — URINALYSIS, ROUTINE W REFLEX MICROSCOPIC
Glucose, UA: NEGATIVE mg/dL
Hgb urine dipstick: NEGATIVE
Ketones, ur: NEGATIVE mg/dL
Leukocytes,Ua: NEGATIVE
Nitrite: NEGATIVE
Protein, ur: NEGATIVE mg/dL
Specific Gravity, Urine: >1.03 — ABNORMAL HIGH (ref 1.005–1.030)
pH: 6 (ref 5.0–8.0)

## 2021-08-22 LAB — HCG, QUANTITATIVE, PREGNANCY: hCG, Beta Chain, Quant, S: 17205 m[IU]/mL — ABNORMAL HIGH (ref <5)

## 2021-08-22 LAB — PREGNANCY, URINE: Preg Test, Ur: POSITIVE — AB

## 2021-08-22 MED ORDER — DOXYLAMINE-PYRIDOXINE 10-10 MG PO TBEC: 2.0000 | DELAYED_RELEASE_TABLET | Freq: Every day | ORAL | 1 refills | Status: DC

## 2021-08-22 NOTE — Progress Notes (Signed)
Written and verbal d/c instructions given and understanding voiced. 

## 2021-08-22 NOTE — Discharge Instructions (Signed)

## 2021-08-22 NOTE — Progress Notes (Signed)
GC/Chlamydia & wet prep cultures obtained via pt self swabbing.

## 2021-08-22 NOTE — MAU Provider Note (Signed)
History     CSN: [id number]  Arrival date and time: 08/22/21 1543   Event Date/Time   First Provider Initiated Contact with Patient 08/22/21 1758      Chief Complaint  Patient presents with   Abdominal Pain   26 y.o. [id number] @unknown  gestation presenting with low abdominal cramping. She is unsure of onset but became worried today because of SAB in November. Cramping is bilateral and central. Rates pain 5/10. Denies VB. Denies urinary sx. Reports frequent N/V.   OB History     Gravida  4   Para  1   Term  1   Preterm      AB  2   Living  1      SAB  2   IAB      Ectopic      Multiple  0   Live Births  1           Past Medical History:  Diagnosis Date   Fibroids    Gestational hypertension 05/12/2017   Insufficient weight gain during pregnancy 04/02/2017   Patient states that she is still throwing up after each meal; she has lost weight since she became pregnant.    Pregnancy induced hypertension     History reviewed. No pertinent surgical history.  Family History  Problem Relation Age of Onset   Healthy Mother    Varicose Veins Father    Diabetes Paternal Grandmother    Hyperlipidemia Paternal Grandmother     Social History   Tobacco Use   Smoking status: Never   Smokeless tobacco: Never  Vaping Use   Vaping Use: Never used  Substance Use Topics   Alcohol use: No   Drug use: No    Allergies: No Known Allergies  Medications Prior to Admission  Medication Sig Dispense Refill Last Dose   Prenatal Vit-Fe Fumarate-FA (PRENATAL COMPLETE) 14-0.4 MG TABS Take 1 tablet by mouth daily. 60 tablet 0 08/21/2021   ibuprofen (ADVIL) 600 MG tablet Take 1 tablet (600 mg total) by mouth every 6 (six) hours as needed. 30 tablet 0     Review of Systems  Constitutional:  Negative for chills and fever.  Gastrointestinal:  Positive for abdominal pain, constipation, nausea and vomiting.  Genitourinary:  Negative for dysuria, frequency, hematuria,  vaginal bleeding and vaginal discharge.  Physical Exam   Blood pressure 128/66, pulse 90, temperature 98.6 F (37 C), temperature source Oral, resp. rate 18, height 5\' 5"  (1.651 m), weight 85 kg, SpO2 100 %, unknown if currently breastfeeding.  Physical Exam Vitals and nursing note reviewed.  Constitutional:      General: She is not in acute distress.    Appearance: Normal appearance.  HENT:     Head: Normocephalic and atraumatic.  Cardiovascular:     Rate and Rhythm: Normal rate.  Pulmonary:     Effort: Pulmonary effort is normal. No respiratory distress.  Abdominal:     General: There is no distension.     Palpations: Abdomen is soft. There is no mass.     Tenderness: There is no abdominal tenderness. There is no guarding or rebound.     Hernia: No hernia is present.  Musculoskeletal:        General: Normal range of motion.     Cervical back: Normal range of motion.  Skin:    General: Skin is warm and dry.  Neurological:     General: No focal deficit present.     Mental Status: She  is alert and oriented to person, place, and time.  Psychiatric:        Mood and Affect: Mood normal.        Behavior: Behavior normal.   Results for orders placed or performed during the hospital encounter of 08/22/21 (from the past 24 hour(s))  Urinalysis, Routine w reflex microscopic Urine, Clean Catch     Status: Abnormal   Collection Time: 08/22/21  4:21 PM  Result Value Ref Range   Color, Urine YELLOW YELLOW   APPearance CLEAR CLEAR   Specific Gravity, Urine >1.030 (H) 1.005 - 1.030   pH 6.0 5.0 - 8.0   Glucose, UA NEGATIVE NEGATIVE mg/dL   Hgb urine dipstick NEGATIVE NEGATIVE   Bilirubin Urine SMALL (A) NEGATIVE   Ketones, ur NEGATIVE NEGATIVE mg/dL   Protein, ur NEGATIVE NEGATIVE mg/dL   Nitrite NEGATIVE NEGATIVE   Leukocytes,Ua NEGATIVE NEGATIVE  Pregnancy, urine     Status: Abnormal   Collection Time: 08/22/21  4:31 PM  Result Value Ref Range   Preg Test, Ur POSITIVE (A)  NEGATIVE  CBC     Status: Abnormal   Collection Time: 08/22/21  5:57 PM  Result Value Ref Range   WBC 5.1 4.0 - 10.5 K/uL   RBC 4.10 3.87 - 5.11 MIL/uL   Hemoglobin 10.8 (L) 12.0 - 15.0 g/dL   HCT 33.1 (L) 36.0 - 46.0 %   MCV 80.7 80.0 - 100.0 fL   MCH 26.3 26.0 - 34.0 pg   MCHC 32.6 30.0 - 36.0 g/dL   RDW 18.8 (H) 11.5 - 15.5 %   Platelets 339 150 - 400 K/uL   nRBC 0.0 0.0 - 0.2 %  hCG, quantitative, pregnancy     Status: Abnormal   Collection Time: 08/22/21  5:57 PM  Result Value Ref Range   hCG, Beta Chain, Quant, S 17,205 (H) <5 mIU/mL  Wet prep, genital     Status: None   Collection Time: 08/22/21  6:09 PM   Specimen: Vaginal  Result Value Ref Range   Yeast Wet Prep HPF POC NONE SEEN NONE SEEN   Trich, Wet Prep NONE SEEN NONE SEEN   Clue Cells Wet Prep HPF POC NONE SEEN NONE SEEN   WBC, Wet Prep HPF POC <10 <10   Sperm NONE SEEN    US OB LESS THAN 14 WEEKS WITH OB TRANSVAGINAL  Result Date: 08/22/2021 CLINICAL DATA:  Abdominal cramping EXAM: OBSTETRIC <14 WK [place] AND TRANSVAGINAL OB US TECHNIQUE: Both transabdominal and transvaginal ultrasound examinations were performed for complete evaluation of the gestation as well as the maternal uterus, adnexal regions, and pelvic cul-de-sac. Transvaginal technique was performed to assess early pregnancy. COMPARISON:  None. FINDINGS: Intrauterine gestational sac: Single Yolk sac:  Visualized. Embryo:  Not Visualized. MSD: 11.0 mm   5 w   5 d Subchorionic hemorrhage:  None visualized. Maternal uterus/adnexae: Anechoic right ovarian cyst is present measuring 4.7 x 4.3 by 4.8 cm. Otherwise, bilateral ovaries appear within normal limits. There is a small amount of free fluid in the pelvis. Multiple uterine fibroids identified. The largest fibroid is in the fundal region measuring 6.1 x 4.8 x 6.8 cm. IMPRESSION: 1. Single intrauterine gestational sac containing yolk sac only measuring 5 weeks 5 days by mean gestational sac diameter. No fetal pole  identified which may be within normal limits for sac size. Recommend short term interval follow-up ultrasound to confirm viability. 2. Uterine fibroids. 3. 4.8 cm simple right ovarian cyst. Recommend attention on follow-up  exam. Electronically Signed   By: Ronney Asters M.D.   On: 08/22/2021 19:11    MAU Course  Procedures  MDM Labs and [place] ordered and reviewed. Early IUP w/p FP. Rt ovarian cyst and uterine fibroids on [place] which may be causing pain. Recommend viability [place] in 2 weeks. Stable for discharge home.  Assessment and Plan   1. Early stage of pregnancy   2. Abdominal cramping affecting pregnancy   3. Anemia during pregnancy in first trimester   4. Right ovarian cyst   5. Leiomyoma of uterus affecting pregnancy in first trimester   6. Morning sickness    Discharge home Follow up at [facility] in 2 weeks for US-message sent SAB precautions Rx Diclegis  Allergies as of 08/22/2021   No Known Allergies      Medication List     STOP taking these medications    ibuprofen 600 MG tablet Commonly known as: ADVIL       TAKE these medications    Doxylamine-Pyridoxine 10-10 MG Tbec Take 2 tablets by mouth at bedtime. May also take 1 tab in am and 1 tab in afternoon   Prenatal Complete 14-0.4 MG Tabs Take 1 tablet by mouth daily.        Julianne Handler, CNM 08/22/2021, 8:01 PM

## 2021-08-22 NOTE — MAU Note (Signed)
Lisa Liu is a 26 y.o. here in MAU reporting: states she had a miscarriage in November/December. States she is pregnant again. Is having some mild cramping and back pain. No bleeding. Had + upt on Saturday.   Pt has her daughter with her, states she will call her husband.  LMP: none since miscarriage  Onset of complaint: a couple of days  Pain score: 5/10  Vitals:   08/22/21 1555  BP: 126/76  Pulse: (!) 102  Resp: 16  Temp: 98.6 F (37 C)  SpO2: 100%     Lab orders placed from triage: upt

## 2021-08-23 LAB — GC/CHLAMYDIA PROBE AMP (~~LOC~~) NOT AT ARMC
Chlamydia: NEGATIVE
Comment: NEGATIVE
Comment: NORMAL
Neisseria Gonorrhea: NEGATIVE

## 2021-08-31 ENCOUNTER — Other Ambulatory Visit: Payer: Self-pay

## 2021-08-31 ENCOUNTER — Inpatient Hospital Stay (HOSPITAL_COMMUNITY): Payer: Medicaid Other

## 2021-08-31 ENCOUNTER — Inpatient Hospital Stay (HOSPITAL_COMMUNITY)
Admission: AD | Admit: 2021-08-31 | Discharge: 2021-08-31 | Disposition: A | Discharge status: Home / Self Care | Payer: Medicaid Other | Attending: Obstetrics & Gynecology | Admitting: Obstetrics & Gynecology

## 2021-08-31 DIAGNOSIS — O3411 Maternal care for benign tumor of corpus uteri, first trimester: Secondary | ICD-10-CM | POA: Diagnosis not present

## 2021-08-31 DIAGNOSIS — R1030 Lower abdominal pain, unspecified: Secondary | ICD-10-CM | POA: Diagnosis not present

## 2021-08-31 DIAGNOSIS — O26891 Other specified pregnancy related conditions, first trimester: Secondary | ICD-10-CM | POA: Diagnosis not present

## 2021-08-31 DIAGNOSIS — Z3491 Encounter for supervision of normal pregnancy, unspecified, first trimester: Secondary | ICD-10-CM

## 2021-08-31 DIAGNOSIS — O09291 Supervision of pregnancy with other poor reproductive or obstetric history, first trimester: Secondary | ICD-10-CM | POA: Insufficient documentation

## 2021-08-31 DIAGNOSIS — O3680X0 Pregnancy with inconclusive fetal viability, not applicable or unspecified: Secondary | ICD-10-CM | POA: Diagnosis not present

## 2021-08-31 DIAGNOSIS — M549 Dorsalgia, unspecified: Secondary | ICD-10-CM | POA: Diagnosis not present

## 2021-08-31 DIAGNOSIS — Z3A01 Less than 8 weeks gestation of pregnancy: Secondary | ICD-10-CM | POA: Diagnosis not present

## 2021-08-31 DIAGNOSIS — D252 Subserosal leiomyoma of uterus: Secondary | ICD-10-CM | POA: Diagnosis not present

## 2021-08-31 LAB — COMPREHENSIVE METABOLIC PANEL
ALT: 43 U/L (ref 0–44)
AST: 27 U/L (ref 15–41)
Albumin: 3.5 g/dL (ref 3.5–5.0)
Alkaline Phosphatase: 45 U/L (ref 38–126)
Anion gap: 6 (ref 5–15)
BUN: <5 mg/dL — ABNORMAL LOW (ref 6–20)
CO2: 23 mmol/L (ref 22–32)
Calcium: 9.3 mg/dL (ref 8.9–10.3)
Chloride: 107 mmol/L (ref 98–111)
Creatinine, Ser: 0.81 mg/dL (ref 0.44–1.00)
GFR, Estimated: >60 mL/min (ref >60)
Glucose, Bld: 88 mg/dL (ref 70–99)
Potassium: 4.1 mmol/L (ref 3.5–5.1)
Sodium: 136 mmol/L (ref 135–145)
Total Bilirubin: 1 mg/dL (ref 0.3–1.2)
Total Protein: 7.1 g/dL (ref 6.5–8.1)

## 2021-08-31 LAB — CBC WITH DIFFERENTIAL/PLATELET
Abs Immature Granulocytes: 0.01 10*3/uL (ref 0.00–0.07)
Basophils Absolute: 0 10*3/uL (ref 0.0–0.1)
Basophils Relative: 0 %
Eosinophils Absolute: 0 10*3/uL (ref 0.0–0.5)
Eosinophils Relative: 1 %
HCT: 36.3 % (ref 36.0–46.0)
Hemoglobin: 12.3 g/dL (ref 12.0–15.0)
Immature Granulocytes: 0 %
Lymphocytes Relative: 33 %
Lymphs Abs: 1.3 10*3/uL (ref 0.7–4.0)
MCH: 27.1 pg (ref 26.0–34.0)
MCHC: 33.9 g/dL (ref 30.0–36.0)
MCV: 80 fL (ref 80.0–100.0)
Monocytes Absolute: 0.4 10*3/uL (ref 0.1–1.0)
Monocytes Relative: 9 %
Neutro Abs: 2.3 10*3/uL (ref 1.7–7.7)
Neutrophils Relative %: 57 %
Platelets: 357 10*3/uL (ref 150–400)
RBC: 4.54 MIL/uL (ref 3.87–5.11)
RDW: 17.6 % — ABNORMAL HIGH (ref 11.5–15.5)
WBC: 4 10*3/uL (ref 4.0–10.5)
nRBC: 0 % (ref 0.0–0.2)

## 2021-08-31 LAB — HCG, QUANTITATIVE, PREGNANCY: hCG, Beta Chain, Quant, S: 88972 m[IU]/mL — ABNORMAL HIGH (ref <5)

## 2021-08-31 MED ORDER — ACETAMINOPHEN 500 MG PO TABS
1000.0000 mg | ORAL_TABLET | Freq: Once | ORAL | Status: AC
  Administered 2021-08-31: 1000 mg via ORAL
  Filled 2021-08-31: qty 2

## 2021-08-31 NOTE — Discharge Instructions (Signed)

## 2021-08-31 NOTE — MAU Note (Signed)
Pt reports to mau with c/o lower abd and back pain.  Denies vag bleeding.

## 2021-08-31 NOTE — MAU Provider Note (Signed)
History     CSN: [id number]  Arrival date and time: 08/31/21 0850   Event Date/Time   First Provider Initiated Contact with Patient 08/31/21 1209      Chief Complaint  Patient presents with   Abdominal Pain   Back Pain   HPI Lisa Liu is a 26 y.o. G4P1021 at [redacted]w[redacted]d who presents with lower abdominal pain. This has been ongoing since she was seen on 1/19. She has not tried anything for the pain. At that visit, she was told the pregnancy had inconclusive viability. She is concerned that the pain means she is going to have a miscarriage. She denies any bleeding or leaking.   OB History     Gravida  4   Para  1   Term  1   Preterm      AB  2   Living  1      SAB  2   IAB      Ectopic      Multiple  0   Live Births  1           Past Medical History:  Diagnosis Date   Fibroids    Gestational hypertension 05/12/2017   Insufficient weight gain during pregnancy 04/02/2017   Patient states that she is still throwing up after each meal; she has lost weight since she became pregnant.    Pregnancy induced hypertension     No past surgical history on file.  Family History  Problem Relation Age of Onset   Healthy Mother    Varicose Veins Father    Diabetes Paternal Grandmother    Hyperlipidemia Paternal Grandmother     Social History   Tobacco Use   Smoking status: Never   Smokeless tobacco: Never  Vaping Use   Vaping Use: Never used  Substance Use Topics   Alcohol use: No   Drug use: No    Allergies: No Known Allergies  Medications Prior to Admission  Medication Sig Dispense Refill Last Dose   Doxylamine-Pyridoxine 10-10 MG TBEC Take 2 tablets by mouth at bedtime. May also take 1 tab in am and 1 tab in afternoon 100 tablet 1    Prenatal Vit-Fe Fumarate-FA (PRENATAL COMPLETE) 14-0.4 MG TABS Take 1 tablet by mouth daily. 60 tablet 0     Review of Systems  Constitutional: Negative.  Negative for fatigue and fever.  HENT: Negative.     Respiratory: Negative.  Negative for shortness of breath.   Cardiovascular: Negative.  Negative for chest pain.  Gastrointestinal:  Positive for abdominal pain. Negative for constipation, diarrhea, nausea and vomiting.  Genitourinary: Negative.  Negative for dysuria.  Neurological: Negative.  Negative for dizziness and headaches.  Physical Exam   Blood pressure 108/83, pulse 80, temperature 98.1 F (36.7 C), temperature source Oral, resp. rate 15, SpO2 99 %, unknown if currently breastfeeding.  Physical Exam Vitals and nursing note reviewed.  Constitutional:      General: She is not in acute distress.    Appearance: She is well-developed.  HENT:     Head: Normocephalic.  Eyes:     Pupils: Pupils are equal, round, and reactive to light.  Cardiovascular:     Rate and Rhythm: Normal rate and regular rhythm.     Heart sounds: Normal heart sounds.  Pulmonary:     Effort: Pulmonary effort is normal. No respiratory distress.     Breath sounds: Normal breath sounds.  Abdominal:     General: Bowel sounds are  normal. There is no distension.     Palpations: Abdomen is soft.     Tenderness: There is no abdominal tenderness.  Skin:    General: Skin is warm and dry.  Neurological:     Mental Status: She is alert and oriented to person, place, and time.  Psychiatric:        Mood and Affect: Mood normal.        Behavior: Behavior normal.        Thought Content: Thought content normal.        Judgment: Judgment normal.    MAU Course  Procedures Results for orders placed or performed during the hospital encounter of 08/31/21 (from the past 24 hour(s))  CBC with Differential/Platelet     Status: Abnormal   Collection Time: 08/31/21 10:23 AM  Result Value Ref Range   WBC 4.0 4.0 - 10.5 K/uL   RBC 4.54 3.87 - 5.11 MIL/uL   Hemoglobin 12.3 12.0 - 15.0 g/dL   HCT 36.3 36.0 - 46.0 %   MCV 80.0 80.0 - 100.0 fL   MCH 27.1 26.0 - 34.0 pg   MCHC 33.9 30.0 - 36.0 g/dL   RDW 17.6 (H) 11.5 -  15.5 %   Platelets 357 150 - 400 K/uL   nRBC 0.0 0.0 - 0.2 %   Neutrophils Relative % 57 %   Neutro Abs 2.3 1.7 - 7.7 K/uL   Lymphocytes Relative 33 %   Lymphs Abs 1.3 0.7 - 4.0 K/uL   Monocytes Relative 9 %   Monocytes Absolute 0.4 0.1 - 1.0 K/uL   Eosinophils Relative 1 %   Eosinophils Absolute 0.0 0.0 - 0.5 K/uL   Basophils Relative 0 %   Basophils Absolute 0.0 0.0 - 0.1 K/uL   Immature Granulocytes 0 %   Abs Immature Granulocytes 0.01 0.00 - 0.07 K/uL  Comprehensive metabolic panel     Status: Abnormal   Collection Time: 08/31/21 10:23 AM  Result Value Ref Range   Sodium 136 135 - 145 mmol/L   Potassium 4.1 3.5 - 5.1 mmol/L   Chloride 107 98 - 111 mmol/L   CO2 23 22 - 32 mmol/L   Glucose, Bld 88 70 - 99 mg/dL   BUN <5 (L) 6 - 20 mg/dL   Creatinine, Ser 0.81 0.44 - 1.00 mg/dL   Calcium 9.3 8.9 - 10.3 mg/dL   Total Protein 7.1 6.5 - 8.1 g/dL   Albumin 3.5 3.5 - 5.0 g/dL   AST 27 15 - 41 U/L   ALT 43 0 - 44 U/L   Alkaline Phosphatase 45 38 - 126 U/L   Total Bilirubin 1.0 0.3 - 1.2 mg/dL   GFR, Estimated >60 >60 mL/min   Anion gap 6 5 - 15  hCG, quantitative, pregnancy     Status: Abnormal   Collection Time: 08/31/21 10:23 AM  Result Value Ref Range   hCG, Beta Chain, Quant, S 88,972 (H) <5 mIU/mL    US OB Comp Less 14 Wks  Result Date: 08/31/2021 CLINICAL DATA:  Pregnant patient.  Viability check. EXAM: OBSTETRIC <14 WK ULTRASOUND TECHNIQUE: Transabdominal ultrasound was performed for evaluation of the gestation as well as the maternal uterus and adnexal regions. COMPARISON:  08/22/2021 FINDINGS: Intrauterine gestational sac: Single Yolk sac:  Visualized. Embryo:  Visualized. Cardiac Activity: Visualized. Heart Rate: 126 bpm CRL:   8.9 mm   6 w 5 d  [place] EDC: 04/21/2022 Subchorionic hemorrhage:  None visualized. Maternal uterus/adnexae: Multiple fibroids, several with peripheral calcification. Largest is a subserosal fundal fibroid measuring 6.4 x 6.1 x 6.2 cm.  Right ovary mildly enlarged by the cyst, cyst measuring 5.7 x 5.0 x 5.3 cm, similar to the prior ultrasound. Normal left ovary. No adnexal masses. No pelvic free fluid. IMPRESSION: 1. Normal pregnancy progression. There is now a single live intrauterine pregnancy with a measured gestational age of [redacted] weeks and 5 days. 2. Simple appearing right ovarian cyst similar to the prior ultrasound. Recommend follow-up US in 3-6 months. Note: This recommendation does not apply to premenarchal patients or to those with increased risk (genetic, family history, elevated tumor markers or other high-risk factors) of ovarian cancer. Reference: Radiology 2019 Nov; 293(2):359-371. 3. Stable uterine fibroids. Electronically Signed   By: Lajean Manes M.D.   On: 08/31/2021 11:49     MDM UA CBC, CMP, HCG US OB transvaginal Tylenol PO Assessment and Plan   1. Normal intrauterine pregnancy on prenatal ultrasound in first trimester   2. Pregnancy with inconclusive fetal viability   3. [redacted] weeks gestation of pregnancy    -Discharge home in stable condition -Fibroid precautions discussed -Patient advised to follow-up with OB as scheduled for prenatal care -Patient may return to MAU as needed or if her condition were to change or worsen   Wende Mott CNM 08/31/2021, 12:09 PM

## 2021-09-04 DIAGNOSIS — Z419 Encounter for procedure for purposes other than remedying health state, unspecified: Secondary | ICD-10-CM | POA: Diagnosis not present

## 2021-09-16 ENCOUNTER — Other Ambulatory Visit: Payer: Self-pay

## 2021-09-16 ENCOUNTER — Encounter (HOSPITAL_COMMUNITY): Payer: Self-pay | Admitting: Obstetrics & Gynecology

## 2021-09-16 ENCOUNTER — Inpatient Hospital Stay (HOSPITAL_COMMUNITY)
Admission: AD | Admit: 2021-09-16 | Discharge: 2021-09-16 | Disposition: A | Discharge status: Home / Self Care | Payer: Medicaid Other | Attending: Obstetrics & Gynecology | Admitting: Obstetrics & Gynecology

## 2021-09-16 DIAGNOSIS — D259 Leiomyoma of uterus, unspecified: Secondary | ICD-10-CM | POA: Diagnosis not present

## 2021-09-16 DIAGNOSIS — O99611 Diseases of the digestive system complicating pregnancy, first trimester: Secondary | ICD-10-CM | POA: Insufficient documentation

## 2021-09-16 DIAGNOSIS — O26891 Other specified pregnancy related conditions, first trimester: Secondary | ICD-10-CM | POA: Diagnosis not present

## 2021-09-16 DIAGNOSIS — Z3A09 9 weeks gestation of pregnancy: Secondary | ICD-10-CM | POA: Diagnosis not present

## 2021-09-16 DIAGNOSIS — Z3490 Encounter for supervision of normal pregnancy, unspecified, unspecified trimester: Secondary | ICD-10-CM

## 2021-09-16 DIAGNOSIS — R109 Unspecified abdominal pain: Secondary | ICD-10-CM

## 2021-09-16 DIAGNOSIS — K5900 Constipation, unspecified: Secondary | ICD-10-CM | POA: Diagnosis not present

## 2021-09-16 DIAGNOSIS — R102 Pelvic and perineal pain: Secondary | ICD-10-CM | POA: Insufficient documentation

## 2021-09-16 DIAGNOSIS — O3411 Maternal care for benign tumor of corpus uteri, first trimester: Secondary | ICD-10-CM | POA: Insufficient documentation

## 2021-09-16 LAB — URINALYSIS, ROUTINE W REFLEX MICROSCOPIC
Bilirubin Urine: NEGATIVE
Glucose, UA: 50 mg/dL — AB
Hgb urine dipstick: NEGATIVE
Ketones, ur: NEGATIVE mg/dL
Leukocytes,Ua: NEGATIVE
Nitrite: NEGATIVE
Protein, ur: NEGATIVE mg/dL
Specific Gravity, Urine: 1.025 (ref 1.005–1.030)
pH: 6 (ref 5.0–8.0)

## 2021-09-16 NOTE — MAU Note (Signed)
Lisa Liu is a 26 y.o. at [redacted]w[redacted]d here in MAU reporting: Pt has fibroids and is having cramping in her lower abdomen. Also feels bloated.  LMP:  Onset of complaint: 09/15/21 Pain score: 6/10 Vitals:   09/16/21 1935  BP: 126/78  Pulse: 89  Resp: 16  Temp: 98 F (36.7 C)  SpO2: 100%     FHT: Lab orders placed from triage:

## 2021-09-16 NOTE — MAU Provider Note (Signed)
History     CSN: [id number]  Arrival date and time: 09/16/21 1924   Event Date/Time   First Provider Initiated Contact with Patient 09/16/21 2030      Chief Complaint  Patient presents with   Abdominal Pain   Lisa Liu is a 26 y.o. G4P1021 at [redacted]w[redacted]d who presents today with abdominal pain. She reports that she has a fibroid and she has also had some constipation. She denies any cramping or vaginal bleeding.   Abdominal Pain This is a new problem. The current episode started yesterday. The onset quality is gradual. The problem occurs intermittently. The problem has been unchanged. The pain is located in the suprapubic region and periumbilical region. The pain is at a severity of 5/10. The quality of the pain is cramping. The abdominal pain does not radiate. Nothing aggravates the pain. The pain is relieved by Nothing. She has tried acetaminophen for the symptoms. The treatment provided moderate relief.   OB History     Gravida  4   Para  1   Term  1   Preterm      AB  2   Living  1      SAB  2   IAB      Ectopic      Multiple  0   Live Births  1           Past Medical History:  Diagnosis Date   Fibroids    Gestational hypertension 05/12/2017   Insufficient weight gain during pregnancy 04/02/2017   Patient states that she is still throwing up after each meal; she has lost weight since she became pregnant.    Pregnancy induced hypertension     History reviewed. No pertinent surgical history.  Family History  Problem Relation Age of Onset   Healthy Mother    Varicose Veins Father    Diabetes Paternal Grandmother    Hyperlipidemia Paternal Grandmother     Social History   Tobacco Use   Smoking status: Never   Smokeless tobacco: Never  Vaping Use   Vaping Use: Never used  Substance Use Topics   Alcohol use: No   Drug use: No    Allergies: No Known Allergies  Medications Prior to Admission  Medication Sig Dispense Refill Last  Dose   Doxylamine-Pyridoxine 10-10 MG TBEC Take 2 tablets by mouth at bedtime. May also take 1 tab in am and 1 tab in afternoon 100 tablet 1    Prenatal Vit-Fe Fumarate-FA (PRENATAL COMPLETE) 14-0.4 MG TABS Take 1 tablet by mouth daily. 60 tablet 0     Review of Systems  Gastrointestinal:  Positive for abdominal pain.  All other systems reviewed and are negative. Physical Exam   Blood pressure 126/78, pulse 89, temperature 98 F (36.7 C), temperature source Oral, resp. rate 16, height 5\' 5"  (1.651 m), weight 84 kg, SpO2 100 %, unknown if currently breastfeeding.  Physical Exam Constitutional:      Appearance: She is well-developed.  HENT:     Head: Normocephalic.  Eyes:     Pupils: Pupils are equal, round, and reactive to light.  Cardiovascular:     Rate and Rhythm: Normal rate and regular rhythm.     Heart sounds: Normal heart sounds.  Pulmonary:     Effort: Pulmonary effort is normal. No respiratory distress.     Breath sounds: Normal breath sounds.  Abdominal:     Palpations: Abdomen is soft.     Tenderness: There is  no abdominal tenderness.  Genitourinary:    Vagina: No bleeding. Vaginal discharge: mucusy.    Comments: External: no lesion Vagina: small amount of white discharge     Musculoskeletal:        General: Normal range of motion.     Cervical back: Normal range of motion and neck supple.  Skin:    General: Skin is warm and dry.  Neurological:     Mental Status: She is alert and oriented to person, place, and time.  Psychiatric:        Mood and Affect: Mood normal.        Behavior: Behavior normal.    Pt informed that the ultrasound is considered a limited OB ultrasound and is not intended to be a complete ultrasound exam.  Patient also informed that the ultrasound is not being completed with the intent of assessing for fetal or placental anomalies or any pelvic abnormalities.  Explained that the purpose of todays ultrasound is to assess for  viability.   Patient acknowledges the purpose of the exam and the limitations of the study.   FHT 160  MAU Course  Procedures  MDM   Assessment and Plan   1. Abdominal pain during pregnancy in first trimester   2. Intrauterine pregnancy   3. Uterine leiomyoma, unspecified location    DC home Comfort measures reviewed  1st Trimester precautions  Bleeding precautions RX: none  Return to MAU as needed FU with OB as planned   Marcille Buffy DNP, CNM  09/16/21  9:02 PM

## 2021-09-18 ENCOUNTER — Telehealth (INDEPENDENT_AMBULATORY_CARE_PROVIDER_SITE_OTHER): Payer: Medicaid Other

## 2021-09-18 DIAGNOSIS — Z3A00 Weeks of gestation of pregnancy not specified: Secondary | ICD-10-CM

## 2021-09-18 DIAGNOSIS — Z3480 Encounter for supervision of other normal pregnancy, unspecified trimester: Secondary | ICD-10-CM

## 2021-09-18 MED ORDER — BLOOD PRESSURE MONITORING DEVI: 1.0000 | 0 refills | Status: DC

## 2021-09-18 MED ORDER — GOJJI WEIGHT SCALE MISC: 1.0000 | 0 refills | Status: DC

## 2021-09-18 NOTE — Progress Notes (Signed)
New OB Intake  I connected with  Lisa Liu on 09/18/21 at  9:15 AM EST by MyChart Video Visit and verified that I am speaking with the correct person using two identifiers. Nurse is located at [facility] and pt is located at work.  I discussed the limitations, risks, security and privacy concerns of performing an evaluation and management service by telephone and the availability of in person appointments. I also discussed with the patient that there may be a patient responsible charge related to this service. The patient expressed understanding and agreed to proceed.  I explained I am completing New OB Intake today. We discussed her EDD of 04/21/22 that is based on U/S on 08/31/21. Pt is G4/P1. I reviewed her allergies, medications, Medical/Surgical/OB history, and appropriate screenings. I informed her of [facility] services. Based on history, this is a/an  pregnancy uncomplicated .   Patient Active Problem List   Diagnosis Date Noted   Fibroid uterus 07/18/2016    Concerns addressed today  Delivery Plans:  Plans to deliver at [facility].   MyChart/Babyscripts MyChart access verified. I explained pt will have some visits in office and some virtually. Babyscripts instructions given and order placed. Patient verifies receipt of registration text/e-mail. Account successfully created and app downloaded.  Blood Pressure Cuff  Blood pressure cuff ordered for patient to pick-up from [employer]. Explained after first prenatal appt pt will check weekly and document in 31.  Weight scale: Patient does / does not  have weight scale. Weight scale ordered for patient to pick up from [employer].   Anatomy US Explained first scheduled [place] will be around 19 weeks. Anatomy US scheduled for 11/25/21 at 0745a. Pt notified to arrive at 0730a. Scheduled AFP lab only appointment if CenteringPregnancy pt for same day as anatomy US.   Labs Discussed Johnsie Cancel genetic screening with patient.  Would like both Panorama and Horizon drawn at new OB visit.Also if interested in genetic testing, tell patient she will need AFP 15-21 weeks to complete genetic testing .Routine prenatal labs needed.  Covid Vaccine Patient has covid vaccine.   Is patient a CenteringPregnancy candidate? Accepted   Is patient a Mom+Baby Combined Care candidate? Not a candidate     Informed patient of [facility] website  and placed link in her AVS.   Social Determinants of Health Food Insecurity: Patient denies food insecurity. WIC Referral: Patient is interested in referral to [facility].  Transportation: Patient denies transportation needs. Childcare: Discussed no children allowed at ultrasound appointments. Offered childcare services; patient declines childcare services at this time.  Send link to Pregnancy Navigators   Placed OB Box on problem list and updated  First visit review I reviewed new OB appt with pt. I explained she will have a pelvic exam, ob bloodwork with genetic screening, and PAP smear. Explained pt will be seen by Dr. Kennon Rounds at first visit; encounter routed to appropriate provider. Explained that patient will be seen by pregnancy navigator following visit with provider. [facility] information placed in AVS.   Bethanne Ginger, CMA 09/18/2021  9:25 AM

## 2021-09-18 NOTE — Patient Instructions (Signed)
AREA PEDIATRIC/FAMILY PRACTICE PHYSICIANS  Central/[place]outheast [place] ([address]) [facility] Chambliss, MD; Eniola, MD; Hale, MD; Hensel, MD; McDiarmid, MD; McIntyer, MD; Admire Bunnell, MD; Walden, MD [address] [place], [place] [address] [phone] Mon-Fri 8:30-12:30, 1:30-5:00 Providers come to see babies at [facility] Accepting Medicaid [facility] providers who accept newborns: Koirala, MD; Morrow, MD; Wolters, MD [address], [place], [place] [address] [phone] Mon-Fri 8:00-5:30 Babies seen by providers at [facility] Does NOT accept Medicaid Please call early in hospitalization for appointment (limited availability)  [facility] Mulberry, MD [address] [place], [place] [address] [phone] Mon, Tue, Thur, Fri 8:30-5:00, Wed 10:00-7:00 (closed 1-2pm) Babies seen by [facility] providers Accepting Medicaid Rubin - Pediatrician Rubin, MD [address], [place], [place] [address] [phone] Mon-Fri 8:30-5:00, Sat 8:30-12:00 Provider comes to see babies at [facility] Accepting Medicaid Must have been referred from current patients or contacted office prior to delivery [facility] ([facility]) Brown, MD; Chandler, MD; Ettefagh, MD; Grant, MD; Lester, MD; McCormick, MD; McQueen, MD; Prose, MD; Simha, MD; Stanley, MD; Stryffeler, NP; Tebben, NP [address], [place], [place] [address] [phone] Mon, Tue, Thur, Fri 8:30-5:30, Wed 9:30-5:30, Sat 8:30-12:30 Babies seen by [facility] providers Accepting Medicaid Only accepting infants of first-time parents or siblings of current patients Hospital discharge coordinator will make follow-up appointment Jack Amos [address], [place], [place]  [address] [phone]   Fax - [phone] [facility] [address], [place], [place]  [address] Phone - [phone]   Fax - [phone] Shilpa Gosrani [address], [place], [place]  [address] [phone]  [place]ortheast [place] ([address]) [facility] Bates, MD; Brassfield, MD; Cooper, Cox, MD; MD; Davis, MD; Dovico, MD; Ettefaugh, MD; Little, MD; Lowe, MD; Keiffer, MD; Melvin, MD; Sumner, MD; Williams, MD [address], [place], [place] [address] [phone] Mon-Fri 8:30-5:00 (extended evenings Mon-Thur as needed), Sat-Sun 10:00-1:00 Providers come to see babies at [facility] Accepting Medicaid for families of first-time babies and families with all children in the household age 3 and under. Must register with office prior to making appointment (M-F only). [facility] Henson, NP; Knapp, MD; Lalonde, MD; Tysinger, PA [address] [place], [place] [address] [phone] Mon-Fri 8:00-5:00 Babies seen by providers at [facility] Does NOT accept Medicaid/Commercial Insurance Only [facility] ([facility])  Artis, MD; Barnes, MD; Bratton, MD; Coccaro, MD; Lockett Gardner, MD; Kramer, MD; Marshall, MD; Netherton, MD; Poleto, MD; Skinner, MD [address] [place], [place] [address] [phone] Mon-Fri 8:30-5:30, Sat (Oct.-Mar.) 9:00-1:00 Babies seen by providers at [facility] Accepting Medicaid  [place] ([address]) [facility] Reid, MD; Warner, MD [address], [place], [place] [address] [phone] Mon-Fri 8:30-5:00, Sat 8:30-12:00 Providers come to see babies at [facility] Does NOT accept Medicaid [facility] Becker, PA; Hagler, MD; Scifres, PA; Sun, MD; Swayne, MD [address], [place], [place] [address] [phone] Mon-Fri 8:00-5:00 Babies seen by providers at [facility] Does NOT accept Medicaid Only accepting babies of parents who  are patients Please call early in hospitalization for appointment (limited availability) [facility] Clark, MD; Frye, MD; Kelleher, MD; Mack, NP; Miller, MD; O'Keller, MD; Patterson, NP; Pudlo, MD; Puzio, MD; Thomas, MD; Tucker, MD; Twiselton, MD [address], [place], [place] [address] [phone] Mon-Fri 8:00-5:00, Sat 9:00-12:00 Providers come to see babies at [facility] Does NOT accept Medicaid  [place] ([address]) [facility] providers accepting new patients: Brake, NP; Wharton, PA [address], [place], [place] [address] [phone] Mon-Fri 8:00-5:00 Babies seen by providers at [facility] Does NOT accept Medicaid Only accepting babies of parents who are patients Please call early in hospitalization for appointment (limited availability) [facility] Gay, MD; Quinlan, MD [address] [place], [place] [address] [phone] (press 1 to schedule appointment) Mon-Fri 8:00-5:00 Providers come to see babies at [facility] Does NOT accept Medicaid [facility] Mazer, MD [address] [place], [place] [address] [phone] Mon-Fri 8:30-5:00 (lunch 12:30-1:00), extended hours by appointment only Wed 5:00-6:30 Babies seen by [facility] providers Accepting Medicaid [facility] Banks, MD; Jordan, MD; Koberlein, MD [address], [place], [place] [address] [phone] Mon-Fri 8:00-5:00 Babies seen by [facility] providers Does NOT accept Medicaid [facility] Parker, MD; [place], MD; Wallace, DO [address] [place], [place] [address] [phone] Mon-Fri 8:00-5:00 Babies seen by [facility] providers Does NOT accept Medicaid [facility] Brandon, PA; Brecken, PA; Christy, NP; Dees, MD; DeClaire, MD; DeWeese, MD; Hansen, NP; Mills, NP; Parrish, NP; Smoot, NP; Summer, MD; Vapne,  MD [address] [place], [place] [address] [phone] Mon-Fri 8:30-5:00, Sat 10:00-1:00 Providers come to see babies at [facility] Does NOT accept Medicaid Free prenatal information session Tuesdays at 4:45pm [facility] Bouska, MD; Gordon, PA; Jeffery, PA; Weber, PA [address] [place] [address] [phone] Mon-Fri 7:30-5:30 Babies seen by [facility] providers [facility] [address], [place], [place]  [address] [phone]   Fax - [phone]  [place] ([address] & [address]) [facility] [place] [address] [place], [place] [address] [phone] Mon-Thur 8:00-6:00 Providers come to see babies at [facility] Accepting Medicaid [facility] Anderson, NP; Badger, MD; Beal, PA; Spencer, PA [address] [place], [place] [address] [phone] Mon-Thur 7:30-7:30, Fri 7:30-4:30 Babies seen by [facility] providers Accepting Medicaid [facility] Agbuya, MD; Klett, NP; Romgoolam, MD [address], [place], [place] [address] [phone] Mon-Fri 8:30-5:00, Sat 8:30-12:00 Providers come to see babies at [facility] Accepting Medicaid Must have "Meet & Greet" appointment at office prior to delivery [facility] ([facility]) McCord, MD; Wallace, MD; Wood, MD [address], [place], [place] [address] [phone] Mon-Wed 8:00-6:00, Thur-Fri 8:00-5:00, Sat 9:00-12:00 Providers come to see babies at [facility] Does NOT accept Medicaid Only accepting siblings of current patients [facility]  [address], [place], [place]  [address] [phone]   Fax - [phone] [facility] [address], [place], [place]  [address] [phone]   Fax -  [phone]  [place] ([address] & [address]) [facility] Cirigliano, DO; Matthews, DO [address] , [place] [address] [phone] Mon-Fri 7:00-5:00 Babies seen by [facility] providers Does NOT accept Medicaid [facility] Briscoe, MD; Howley, PA; Moreira, PA [address], [place], [place] [address] [phone] Mon-Fri 8:00-5:00 Babies seen by [facility] providers Accepting Medicaid [facility] Boyd, MD; Church, PA; Jones, NP; Osborn, PA [address], , [place] [address] [phone] Mon-Fri 8:00-5:00 Babies seen by providers at [facility] Accepting Medicaid  [place] ([address]) [facility] [place] Wendling, DO [address] [place], [place] [address] [phone] Mon-Fri 8:00-5:00 Babies seen by [facility] providers Does NOT accept Medicaid Limited availability, please call early in hospitalization to schedule follow-up [facility] Calderon, PA; Cummings, MD; Dillard, MD; Martin, PA; Olson, MD; VanDeven, PA [address], [place], [place] [address] [phone] Mon-Fri 8:30-5:00, Sat 9:00-12:00 Babies seen by providers at [facility] Accepting Medicaid Please register online then schedule online or call office [email or web address]) Hunter, NP; Kumar, MD; Martin Rogers, PA [address], [place], [place] [address] [phone] Mon-Fri 8:00-5:00 Babies seen by providers at [facility] Accepting Medicaid [facility] ([facility]) Jasper, MD; Kristi Fleenor, NP; West, MD [address], [place], [place] [address] [phone] Mon-Fri 8:00-5:30, Sat&Sun by appointment (phones open at  8:30) Babies seen by [facility] providers Accepting Medicaid Must be a first-time baby or sibling of current patient [facility] [place]  [address], [place], [place]  [address] [phone]   Fax - [phone]  [place] ([address]) [facility] Brown, PA; Cowen, PA; Rice, MD; Helton, PA; Spry, MD [address] [place], [place] [address] [phone] Mon-Thur 8:00-7:00, Fri 8:00-5:00, Sat 8:00-12:00, Sun 9:00-12:00 Babies seen by [facility] providers Accepting Medicaid [facility] Coe-Goins, MD; Marshall, MD; Pierre-Louis, MD [address], [place], [place] [address] [phone] Mon-Thur 8:00-5:00 Babies seen by providers at [facility] Accepting Medicaid [facility] Bratton, MD; Coe-Goins, MD; Hayes, MD; Lewis, MD; List, MD; Lott, MD; Marshall, MD; Moran, MD; O'Lattie Cervi, MD; Pierre-Louis, MD; Pitonzo, MD; Scholer, MD; Spangle, MD [address] [place], [place] [address] [phone] Mon-Fri 8:00-5:30, Sat (Oct.-Mar.) 9:00-1:00 Babies seen by providers at [facility] Accepting Medicaid Must fill out new patient packet, available online at [email or web address] [facility] ([facility]) Friddle, NP; Harris, NP; Kelly, NP; Logan, MD; Melvin, PA; Poth, MD; Ramadoss, MD; Stanton, NP [address], [place], [place] [address] [phone] Mon-Thur 8:00-5:30, Fri 8:00-5:00 Babies seen by providers at [facility] Accepting Medicaid  [place] ([address]) [facility] Dixon, PA; [place], MD; Pickard, MD; Tapia, PA [address], [place], [place] [address] [phone] Mon-Fri 8:00-5:00 Babies seen by providers at [facility] Accepting Medicaid   [place] ([address]) [facility] [place] Masneri, DO; Meyers, MD; Nelson, PA [address], [place], [place] [address] [phone] Mon-Fri 8:00-5:00 Babies seen by providers at [facility] Does NOT accept Medicaid Limited appointment availability, please call early in hospitalization  [facility] [place] Kunedd, DO; McGowen, MD [address], [place], [place] [address] [phone] Mon-Fri 8:00-5:00 Babies seen by [facility] providers Does NOT accept Medicaid [facility] [place] Cameron, MD; MacDonald, MD; Michaels, PA; Nayak, MD [address] [place] [address], [place],  [address] [phone] Mon-Fri 8:00-5:00 After hours clinic ([address] [place],  [address]) [phone] Mon-Fri 5:00-8:00, Sat 12:00-6:00, Sun 10:00-4:00 Babies seen by [facility] providers Accepting Medicaid [facility] [place] [address], [place], [place]  [address] [phone]   Fax - [phone]  [place] [phone]) [place], [address] [place], [place], [place] [address] [phone] Mon-Fri 8:00-5:00 Babies seen by [facility] providers Does NOT accept Medicaid [place] [facility] [place]) Daron Offer, [place] [address] [place], [place], [place] [address] [phone] Mon-Thur 8:00-7:00, Fri 8:00-5:00, Sat 8:00-12:00 Babies seen by providers at [facility] Accepting Medicaid - but does not have vaccinations in office (must be received elsewhere) Limited availability, please call early in hospitalization  [place] [phone]) Driscoll, MD [address] [place] [address] [phone]  Fax [phone]  [facility]  Lauretta Chester, MD, [place], [place], [place], [place], [place], [place]  [address] [facility]  [place], [place], [place] [address] [place], [place], [place] [address] [phone] ([place])  [facility] [address], [place], [place] [address] [place], [place], [place] [address] [phone] [facility] [address] [place], [place], [place] [address] [phone] [facility] [address], [place], [place] [address] [place], [place], [place] [address] [facility] [address] [place], [place] [address] [phone] [place] [address], [place], [place] [address] [phone] Dr. Okey Regal. [place] [address] [place], [place] [address] [phone] [facility] [address] [place] [address], [place] [address], [place],  [address] [facility] [address], [place],  [address] [phone]   Childbirth Education Options: [facility] Department Classes:  Childbirth education classes can help you get ready for a positive parenting experience. You can also meet other expectant parents and get free stuff for your baby. Each class runs for five weeks on the same night and costs $45 for the mother-to-be and her support person. Medicaid covers the cost if you are eligible. Call [phone] to register. [place] Childbirth Education: Classes can vary in availability and schedule is subject to change. For most up-to-date information please visit [email or web address] to review and register.

## 2021-10-02 DIAGNOSIS — Z419 Encounter for procedure for purposes other than remedying health state, unspecified: Secondary | ICD-10-CM | POA: Diagnosis not present

## 2021-10-03 ENCOUNTER — Encounter: Payer: Medicaid Other | Admitting: Family Medicine

## 2021-10-06 ENCOUNTER — Inpatient Hospital Stay (HOSPITAL_COMMUNITY)
Admission: AD | Admit: 2021-10-06 | Discharge: 2021-10-06 | Disposition: A | Discharge status: Home / Self Care | Payer: Medicaid Other | Attending: Obstetrics and Gynecology | Admitting: Obstetrics and Gynecology

## 2021-10-06 ENCOUNTER — Inpatient Hospital Stay (HOSPITAL_COMMUNITY): Payer: Medicaid Other

## 2021-10-06 ENCOUNTER — Other Ambulatory Visit: Payer: Self-pay

## 2021-10-06 ENCOUNTER — Encounter (HOSPITAL_COMMUNITY): Payer: Self-pay | Admitting: Obstetrics and Gynecology

## 2021-10-06 DIAGNOSIS — O209 Hemorrhage in early pregnancy, unspecified: Secondary | ICD-10-CM | POA: Insufficient documentation

## 2021-10-06 DIAGNOSIS — Z3A12 12 weeks gestation of pregnancy: Secondary | ICD-10-CM | POA: Diagnosis not present

## 2021-10-06 DIAGNOSIS — R1030 Lower abdominal pain, unspecified: Secondary | ICD-10-CM | POA: Insufficient documentation

## 2021-10-06 DIAGNOSIS — O26891 Other specified pregnancy related conditions, first trimester: Secondary | ICD-10-CM | POA: Diagnosis not present

## 2021-10-06 DIAGNOSIS — O09292 Supervision of pregnancy with other poor reproductive or obstetric history, second trimester: Secondary | ICD-10-CM | POA: Insufficient documentation

## 2021-10-06 DIAGNOSIS — O3411 Maternal care for benign tumor of corpus uteri, first trimester: Secondary | ICD-10-CM | POA: Insufficient documentation

## 2021-10-06 DIAGNOSIS — O4690 Antepartum hemorrhage, unspecified, unspecified trimester: Secondary | ICD-10-CM | POA: Diagnosis not present

## 2021-10-06 DIAGNOSIS — D259 Leiomyoma of uterus, unspecified: Secondary | ICD-10-CM | POA: Insufficient documentation

## 2021-10-06 DIAGNOSIS — Z3A11 11 weeks gestation of pregnancy: Secondary | ICD-10-CM | POA: Diagnosis not present

## 2021-10-06 LAB — URINALYSIS, ROUTINE W REFLEX MICROSCOPIC
Bacteria, UA: NONE SEEN
Bilirubin Urine: NEGATIVE
Glucose, UA: NEGATIVE mg/dL
Hgb urine dipstick: NEGATIVE
Ketones, ur: 5 mg/dL — AB
Leukocytes,Ua: NEGATIVE
Nitrite: NEGATIVE
Protein, ur: 30 mg/dL — AB
Specific Gravity, Urine: 1.035 — ABNORMAL HIGH (ref 1.005–1.030)
pH: 5 (ref 5.0–8.0)

## 2021-10-06 NOTE — MAU Provider Note (Signed)
?History  ?  ? ?CSN: [id number] ? ?Arrival date and time: 10/06/21 1115 ? ? Event Date/Time  ? First Provider Initiated Contact with Patient 10/06/21 1138   ?  ? ?Chief Complaint  ?Patient presents with  ? Abdominal Pain  ? Vaginal Bleeding  ? ?HPI ?Lisa Liu is a 26 y.o. [id number] at 43w6dwho presents with vaginal bleeding and abdominal pain. She states she was at church and felt a gush of bleeding. She reports some lower abdominal cramping that she rates a 4/10 and has taken tylenol for with some improvement. She denies any abnormal discharge. She denies any urinary or bowel complaints. She has known fibroids and feels that the pain is related to the fibroids. ? ?OB History   ? ? Gravida  ?4  ? Para  ?1  ? Term  ?1  ? Preterm  ?   ? AB  ?2  ? Living  ?1  ?  ? ? SAB  ?2  ? IAB  ?   ? Ectopic  ?   ? Multiple  ?0  ? Live Births  ?1  ?   ?  ?  ? ? ?Past Medical History:  ?Diagnosis Date  ? Fibroids   ? Gestational hypertension 05/12/2017  ? Insufficient weight gain during pregnancy 04/02/2017  ? Patient states that she is still throwing up after each meal; she has lost weight since she became pregnant.   ? Pregnancy induced hypertension   ? ? ?No past surgical history on file. ? ?Family History  ?Problem Relation Age of Onset  ? Healthy Mother   ? Varicose Veins Father   ? Diabetes Paternal Grandmother   ? Hyperlipidemia Paternal Grandmother   ? ? ?Social History  ? ?Tobacco Use  ? Smoking status: Never  ? Smokeless tobacco: Never  ?Vaping Use  ? Vaping Use: Never used  ?Substance Use Topics  ? Alcohol use: No  ? Drug use: No  ? ? ?Allergies: No Known Allergies ? ?Medications Prior to Admission  ?Medication Sig Dispense Refill Last Dose  ? Blood Pressure Monitoring DEVI 1 each by Does not apply route once a week. 1 each 0   ? Doxylamine-Pyridoxine 10-10 MG TBEC Take 2 tablets by mouth at bedtime. May also take 1 tab in am and 1 tab in afternoon 100 tablet 1   ? Misc. Devices (GOJJI WEIGHT SCALE) MISC 1 each  by Does not apply route once a week. 1 each 0   ? Prenatal Vit-Fe Fumarate-FA (PRENATAL COMPLETE) 14-0.4 MG TABS Take 1 tablet by mouth daily. 60 tablet 0   ? ? ?Review of Systems  ?Constitutional: Negative.  Negative for fatigue and fever.  ?HENT: Negative.    ?Respiratory: Negative.  Negative for shortness of breath.   ?Cardiovascular: Negative.  Negative for chest pain.  ?Gastrointestinal:  Positive for abdominal pain. Negative for constipation, diarrhea, nausea and vomiting.  ?Genitourinary:  Positive for vaginal bleeding. Negative for dysuria and vaginal discharge.  ?Neurological: Negative.  Negative for dizziness and headaches.  ?Physical Exam  ? ?Blood pressure 123/76, pulse 95, temperature 98.1 ?F (36.7 ?C), resp. rate 16, SpO2 100 %, unknown if currently breastfeeding. ? ?Physical Exam ?Vitals and nursing note reviewed.  ?Constitutional:   ?   General: She is not in acute distress. ?   Appearance: She is well-developed.  ?HENT:  ?   Head: Normocephalic.  ?Eyes:  ?   Pupils: Pupils are equal, round, and reactive to light.  ?Cardiovascular:  ?  Rate and Rhythm: Normal rate and regular rhythm.  ?   Heart sounds: Normal heart sounds.  ?Pulmonary:  ?   Effort: Pulmonary effort is normal. No respiratory distress.  ?   Breath sounds: Normal breath sounds.  ?Abdominal:  ?   General: Bowel sounds are normal. There is no distension.  ?   Palpations: Abdomen is soft.  ?   Tenderness: There is no abdominal tenderness.  ?Genitourinary: ?   Comments: Pelvic exam: Cervix pink, visually closed, without lesion, small amount of red blood in vault, vaginal walls and external genitalia normal ? ?Skin: ?   General: Skin is warm and dry.  ?Neurological:  ?   Mental Status: She is alert and oriented to person, place, and time.  ?Psychiatric:     ?   Mood and Affect: Mood normal.     ?   Behavior: Behavior normal.     ?   Thought Content: Thought content normal.     ?   Judgment: Judgment normal.  ? ?Cervix:  closed/thick/posterior ? ?MAU Course  ?Procedures ?Results for orders placed or performed during the hospital encounter of 10/06/21 (from the past 24 hour(s))  ?Urinalysis, Routine w reflex microscopic     Status: Abnormal  ? Collection Time: 10/06/21 12:08 PM  ?Result Value Ref Range  ? Color, Urine AMBER (A) YELLOW  ? APPearance HAZY (A) CLEAR  ? Specific Gravity, Urine 1.035 (H) 1.005 - 1.030  ? pH 5.0 5.0 - 8.0  ? Glucose, UA NEGATIVE NEGATIVE mg/dL  ? Hgb urine dipstick NEGATIVE NEGATIVE  ? Bilirubin Urine NEGATIVE NEGATIVE  ? Ketones, ur 5 (A) NEGATIVE mg/dL  ? Protein, ur 30 (A) NEGATIVE mg/dL  ? Nitrite NEGATIVE NEGATIVE  ? Leukocytes,Ua NEGATIVE NEGATIVE  ? RBC / HPF 0-5 0 - 5 RBC/hpf  ? WBC, UA 0-5 0 - 5 WBC/hpf  ? Bacteria, UA NONE SEEN NONE SEEN  ? Squamous Epithelial / LPF 0-5 0 - 5  ? Mucus PRESENT   ?  ?US OB Comp Less 14 Wks ? ?Result Date: 10/06/2021 ?CLINICAL DATA:  Vaginal bleeding. EXAM: OBSTETRIC <14 WK ULTRASOUND TECHNIQUE: Transabdominal ultrasound was performed for evaluation of the gestation as well as the maternal uterus and adnexal regions. COMPARISON:  08/31/2021 FINDINGS: Intrauterine gestational sac: Single Yolk sac:  Not Visualized. Embryo:  Visualized. Cardiac Activity: Visualized. Heart Rate: 156 bpm CRL:   57.7 mm   12 w 2 d                  [place] EDC: 04/18/2022 Subchorionic hemorrhage:  None visualized. Maternal uterus/adnexae: Right ovary: Right ovary cyst measures 3.4 x 3.0 x 2.8 cm. Previously 5.7 x 5.0 by 5.3 cm. Left ovary: Normal Other :Multiple uterine fibroids are identified. The largest is in the fundus measuring 7.8 by 7.0 x 6.0 cm. Free fluid:  None IMPRESSION: 1. Single living intrauterine gestation with an estimated gestational age of [redacted] weeks and 2 days. The clinical gestational age is 56 weeks and 6 days. 2. Right ovary cyst is decreased in size from previous exam. 3. Uterine fibroids. Electronically Signed   By: Kerby Moors M.D.   On: 10/06/2021 12:55     ? ?MDM ?UA ?US OB Transvaginal ? ?Discussed proper hydration in pregnancy to prevent cramping and pelvic rest due to the bleeding.  ? ?Assessment and Plan  ? ?1. Vaginal bleeding during pregnancy   ?2. [redacted] weeks gestation of pregnancy   ? ?-Discharge home in stable  condition ?-First trimester precautions discussed ?-Patient advised to follow-up with OB as scheduled for prenatal care ?-Patient may return to MAU as needed or if her condition were to change or worsen ? ? ?Wende Mott CNM ?10/06/2021, 11:38 AM  ?

## 2021-10-06 NOTE — MAU Note (Signed)
Pt reports to mau with c/o vag bleeding that started this morning at church, pt reports some lower abd cramping  ? ?

## 2021-10-06 NOTE — Discharge Instructions (Signed)

## 2021-10-09 ENCOUNTER — Other Ambulatory Visit (HOSPITAL_COMMUNITY)
Admission: RE | Admit: 2021-10-09 | Discharge: 2021-10-09 | Disposition: A | Discharge status: Home / Self Care | Payer: Medicaid Other | Source: Ambulatory Visit | Attending: Family Medicine | Admitting: Family Medicine

## 2021-10-09 ENCOUNTER — Ambulatory Visit (INDEPENDENT_AMBULATORY_CARE_PROVIDER_SITE_OTHER): Payer: Medicaid Other | Admitting: Certified Nurse Midwife

## 2021-10-09 ENCOUNTER — Other Ambulatory Visit: Payer: Self-pay

## 2021-10-09 VITALS — BP 100/58 | HR 104 | Wt 178.0 lb

## 2021-10-09 DIAGNOSIS — O219 Vomiting of pregnancy, unspecified: Secondary | ICD-10-CM

## 2021-10-09 DIAGNOSIS — Z3491 Encounter for supervision of normal pregnancy, unspecified, first trimester: Secondary | ICD-10-CM | POA: Diagnosis not present

## 2021-10-09 DIAGNOSIS — Z8759 Personal history of other complications of pregnancy, childbirth and the puerperium: Secondary | ICD-10-CM

## 2021-10-09 DIAGNOSIS — D252 Subserosal leiomyoma of uterus: Secondary | ICD-10-CM

## 2021-10-09 DIAGNOSIS — Z3A12 12 weeks gestation of pregnancy: Secondary | ICD-10-CM

## 2021-10-09 DIAGNOSIS — D251 Intramural leiomyoma of uterus: Secondary | ICD-10-CM | POA: Diagnosis not present

## 2021-10-09 MED ORDER — ASPIRIN EC 81 MG PO TBEC: 81.0000 mg | DELAYED_RELEASE_TABLET | Freq: Every day | ORAL | 11 refills | Status: DC

## 2021-10-09 MED ORDER — METOCLOPRAMIDE HCL 10 MG PO TABS: 10.0000 mg | ORAL_TABLET | Freq: Three times a day (TID) | ORAL | 0 refills | Status: DC | PRN

## 2021-10-09 MED ORDER — BONJESTA 20-20 MG PO TBCR: 1.0000 | EXTENDED_RELEASE_TABLET | Freq: Every day | ORAL | 6 refills | Status: DC

## 2021-10-09 NOTE — Progress Notes (Signed)
? ?History:  ? Lisa Liu is a 26 y.o. [id number] at 36w2dby LMP being seen today for her first obstetrical visit.  Her obstetrical history is significant for pregnancy induced hypertension. Patient does intend to breast feed. Pregnancy history fully reviewed. ? ?Patient reports  still having some brown mucus when she wipes but dark red bleeding has stopped (was seen in MAU on 10/06/21 for this issue. Also reports severe nausea/vomiting, she is able to keep down fluids but throws up every time she eats. She has lost 10lbs, the diclegis is not working  . ?  ?HISTORY: ?OB History  ?Gravida Para Term Preterm AB Living  ?'4 1 1 '$ 0 2 1  ?SAB IAB Ectopic Multiple Live Births  ?2 0 0 0 1  ?  ?# Outcome Date GA Lbr Len/2nd Weight Sex Delivery Anes PTL Lv  ?4 Current           ?3 SAB 07/04/21          ?2 Term 05/12/17 361w1d 01:15 6 lb 11.1 oz (3.036 kg) F Vag-Spont EPI  LIV  ?   Birth Comments: wnl  ?   Name: DJDESTANY, SEVERNS?   Apgar1: 9  Apgar5: 9  ?1 SAB 07/2016 8w30w0d      ?  ?Last pap smear was done 2018 and was normal ? ?Past Medical History:  ?Diagnosis Date  ? Fibroids   ? Gestational hypertension 05/12/2017  ? Insufficient weight gain during pregnancy 04/02/2017  ? Patient states that she is still throwing up after each meal; she has lost weight since she became pregnant.   ? Pregnancy induced hypertension   ? ?No past surgical history on file. ?Family History  ?Problem Relation Age of Onset  ? Healthy Mother   ? Varicose Veins Father   ? Diabetes Paternal Grandmother   ? Hyperlipidemia Paternal Grandmother   ? ?Social History  ? ?Tobacco Use  ? Smoking status: Never  ? Smokeless tobacco: Never  ?Vaping Use  ? Vaping Use: Never used  ?Substance Use Topics  ? Alcohol use: No  ? Drug use: No  ? ?No Known Allergies ?Current Outpatient Medications on File Prior to Visit  ?Medication Sig Dispense Refill  ? Blood Pressure Monitoring DEVI 1 each by Does not apply route once a week. 1 each 0  ? Misc.  Devices (GOJJI WEIGHT SCALE) MISC 1 each by Does not apply route once a week. 1 each 0  ? Prenatal Vit-Fe Fumarate-FA (PRENATAL COMPLETE) 14-0.4 MG TABS Take 1 tablet by mouth daily. 60 tablet 0  ? ?No current facility-administered medications on file prior to visit.  ? ? ?Review of Systems ?Pertinent items noted in HPI and remainder of comprehensive ROS otherwise negative. ?Physical Exam:  ? ?Vitals:  ? 10/09/21 1410  ?BP: (!) 100/58  ?Pulse: (!) 104  ?Weight: 178 lb (80.7 kg)  ? ?Fetal Heart Rate (bpm): 151 ? ?Constitutional: Well-developed, well-nourished pregnant female in no acute distress.  ?HEENT: PERRLA ?Skin: normal color and turgor, no rash ?Cardiovascular: normal rate & rhythm, no murmur ?Respiratory: normal effort ?GI: Abd soft, non-tender, pos BS x 4, gravid appropriate for gestational age ?MS: Extremities nontender, no edema, normal ROM ?Neurologic: Alert and oriented x 4.  ?GU: no CVA tenderness ?Pelvic: NEFG, physiologic discharge, brown blood mixed with cervical mucus, cervix otherwise clean. Pap/swabs collected ? ?Assessment & Plan:  ? ?1. Supervision of low-risk pregnancy, first trimester ?- Doing well other than the  n/v, no fetal movement yet ?- Cytology - PAP( [facility]) ?- GC/Chlamydia probe amp ()not at [facility] ?- Genetic Screening ?- Culture, OB Urine ?- CBC/D/Plt+RPR+Rh+ABO+RubIgG... ? ?2. [redacted] weeks gestation of pregnancy ?- Routine OB care  ? ?3. History of gestational hypertension ?- aspirin EC 81 MG tablet; Take 1 tablet (81 mg total) by mouth daily. Swallow whole. Start taking on 10/14/21.  Dispense: 30 tablet; Refill: 11 ? ?4. Intramural and subserous leiomyoma of uterus ?- Reviewed how fibroids can affect pregnancy including degeneration and when to present to MAU for evaluation ? ?5. Nausea/vomiting in pregnancy ?- Doxylamine-Pyridoxine ER (BONJESTA) 20-20 MG TBCR; Take 1 tablet by mouth at bedtime.  Dispense: 60 tablet; Refill: 6 ?- metoCLOPramide (REGLAN) 10 MG tablet; Take  1 tablet (10 mg total) by mouth every 8 (eight) hours as needed for nausea.  Dispense: 30 tablet; Refill: 0 ? ?6. Initial obstetric visit in first trimester ?- Initial labs drawn. ?- Continue prenatal vitamins. ?- Problem list reviewed and updated. ?- Genetic Screening discussed, First trimester screen, Quad screen, and NIPS: ordered. ?- Ultrasound discussed; fetal anatomic survey: ordered. ?- Anticipatory guidance about prenatal visits given including labs, ultrasounds, and testing. ?- Discussed usage of Babyscripts and virtual visits as additional source of managing and completing prenatal visits in midst of coronavirus and pandemic.   ?- Encouraged to complete MyChart Registration for her ability to review results, send requests, and have questions addressed.  ?- The nature of [place] [facility] with multiple MDs and Advanced Practice Providers was explained to patient; also emphasized that residents, students are part of our team. ?- Routine obstetric precautions reviewed. Encouraged to seek out care at office or emergency room [facility] preferred) for urgent and/or emergent concerns. ?Return in about 3 weeks (around 10/30/2021) for IN-PERSON, CENTERING.  ?  ? ?Gaylan Gerold, MSN, CNM, IBCLC ?Certified Nurse Midwife, [place] ? ?

## 2021-10-10 ENCOUNTER — Encounter: Payer: Self-pay | Admitting: Certified Nurse Midwife

## 2021-10-10 DIAGNOSIS — Z3480 Encounter for supervision of other normal pregnancy, unspecified trimester: Secondary | ICD-10-CM | POA: Diagnosis not present

## 2021-10-10 LAB — CBC/D/PLT+RPR+RH+ABO+RUBIGG...
Antibody Screen: NEGATIVE
Basophils Absolute: 0 10*3/uL (ref 0.0–0.2)
Basos: 0 %
EOS (ABSOLUTE): 0 10*3/uL (ref 0.0–0.4)
Eos: 0 %
HCV Ab: NONREACTIVE
HIV Screen 4th Generation wRfx: NONREACTIVE
Hematocrit: 34.7 % (ref 34.0–46.6)
Hemoglobin: 11.5 g/dL (ref 11.1–15.9)
Hepatitis B Surface Ag: NEGATIVE
Immature Grans (Abs): 0 10*3/uL (ref 0.0–0.1)
Immature Granulocytes: 1 %
Lymphocytes Absolute: 1 10*3/uL (ref 0.7–3.1)
Lymphs: 14 %
MCH: 27.1 pg (ref 26.6–33.0)
MCHC: 33.1 g/dL (ref 31.5–35.7)
MCV: 82 fL (ref 79–97)
Monocytes Absolute: 0.6 10*3/uL (ref 0.1–0.9)
Monocytes: 8 %
Neutrophils Absolute: 5.7 10*3/uL (ref 1.4–7.0)
Neutrophils: 77 %
Platelets: 363 10*3/uL (ref 150–450)
RBC: 4.25 x10E6/uL (ref 3.77–5.28)
RDW: 15.9 % — ABNORMAL HIGH (ref 11.7–15.4)
RPR Ser Ql: NONREACTIVE
Rh Factor: POSITIVE
Rubella Antibodies, IGG: >33 index (ref >0.99)
WBC: 7.4 10*3/uL (ref 3.4–10.8)

## 2021-10-10 LAB — HCV INTERPRETATION

## 2021-10-10 LAB — GC/CHLAMYDIA PROBE AMP (~~LOC~~) NOT AT ARMC
Chlamydia: NEGATIVE
Comment: NEGATIVE
Comment: NORMAL
Neisseria Gonorrhea: NEGATIVE

## 2021-10-11 LAB — CYTOLOGY - PAP
Comment: NEGATIVE
Diagnosis: NEGATIVE
High risk HPV: NEGATIVE

## 2021-10-11 LAB — CULTURE, OB URINE

## 2021-10-11 LAB — URINE CULTURE, OB REFLEX

## 2021-10-18 ENCOUNTER — Encounter: Payer: Self-pay | Admitting: *Deleted

## 2021-10-21 ENCOUNTER — Encounter: Payer: Self-pay | Admitting: Advanced Practice Midwife

## 2021-10-21 DIAGNOSIS — N83209 Unspecified ovarian cyst, unspecified side: Secondary | ICD-10-CM | POA: Insufficient documentation

## 2021-10-22 ENCOUNTER — Other Ambulatory Visit: Payer: Self-pay

## 2021-10-22 ENCOUNTER — Ambulatory Visit (INDEPENDENT_AMBULATORY_CARE_PROVIDER_SITE_OTHER): Payer: Medicaid Other | Admitting: Advanced Practice Midwife

## 2021-10-22 VITALS — BP 100/58 | Wt 178.0 lb

## 2021-10-22 DIAGNOSIS — D251 Intramural leiomyoma of uterus: Secondary | ICD-10-CM

## 2021-10-22 DIAGNOSIS — D252 Subserosal leiomyoma of uterus: Secondary | ICD-10-CM

## 2021-10-22 DIAGNOSIS — O219 Vomiting of pregnancy, unspecified: Secondary | ICD-10-CM

## 2021-10-22 DIAGNOSIS — N83209 Unspecified ovarian cyst, unspecified side: Secondary | ICD-10-CM

## 2021-10-22 DIAGNOSIS — Z3480 Encounter for supervision of other normal pregnancy, unspecified trimester: Secondary | ICD-10-CM

## 2021-10-22 DIAGNOSIS — O3481 Maternal care for other abnormalities of pelvic organs, first trimester: Secondary | ICD-10-CM

## 2021-10-22 MED ORDER — ONDANSETRON HCL 4 MG PO TABS: 4.0000 mg | ORAL_TABLET | Freq: Four times a day (QID) | ORAL | 3 refills | Status: DC | PRN

## 2021-10-23 ENCOUNTER — Encounter: Payer: Self-pay | Admitting: Advanced Practice Midwife

## 2021-10-23 NOTE — Progress Notes (Signed)
? ?  PRENATAL VISIT NOTE ? ?Subjective:  ?Lisa Liu is a 26 y.o. [id number] at 104w2dbeing seen today for ongoing prenatal care.  She is currently monitored for the following issues for this low-risk pregnancy and has Fibroid uterus; Supervision of other normal pregnancy, antepartum; and Ovarian cyst during pregnancy in first trimester on their problem list. ? ?Patient reports vomiting. Reglan helps but concerned about taking taking it when she has to drive due to drowsiness.    Contractions: Not present. Vag. Bleeding: None.  Movement: Absent. Denies leaking of fluid.  ? ?The following portions of the patient's history were reviewed and updated as appropriate: allergies, current medications, past family history, past medical history, past social history, past surgical history and problem list.  ? ?Objective:  ? ?Vitals:  ? 10/22/21 1206  ?BP: (!) 100/58  ?Weight: 178 lb (80.7 kg)  ? ? ?Fetal Status: Fetal Heart Rate (bpm): 155 Fundal Height: 14 cm Movement: Absent    ? ?General:  Alert, oriented and cooperative. Patient is in no acute distress.  ?Skin: Skin is warm and dry. No rash noted.   ?Cardiovascular: Normal heart rate noted  ?Respiratory: Normal respiratory effort, no problems with respiration noted  ?Abdomen: Soft, gravid, appropriate for gestational age.  Pain/Pressure: Absent     ?Pelvic: Cervical exam deferred        ?Extremities: Normal range of motion.  Edema: None  ?Mental Status: Normal mood and affect. Normal behavior. Normal judgment and thought content.  ? ?Assessment and Plan:  ?Pregnancy: [id number] 144w2d1. Nausea/vomiting in pregnancy ?- ondansetron (ZOFRAN) 4 MG tablet; Take 1 tablet (4 mg total) by mouth every 6 (six) hours as needed for nausea or vomiting.  Dispense: 20 tablet; Refill: 3 ? ?2. Supervision of other normal pregnancy, antepartum ?- Routine CenteringPregnancy Care ? ?3. Intramural and subserous leiomyoma of uterus ?- MFM Anatomy USKoreacheduled. Will review their  findings and recommendations.  ? ?4. Ovarian cyst during pregnancy in first trimester ?- MFM Anatomy USKoreacheduled. Will review their findings and recommendations.  ? ? ?Centering Pregnancy, Session#1: Introduction to model of care. Group determined rules for self-governance and closing phrase. Oriented group to space and mother's notebook.  ? ?Facilitated discussion today:  common discomforts, When to call practice ? ?Mindfulness activity completed as well as introduction to deep breathing for childbirth preparation- Centering 3 Breaths ? ?Fundal height and FHR appropriate today unless noted otherwise in plan of care. Patient to continue group care.  ? ?Preterm labor symptoms and general obstetric precautions including but not limited to vaginal bleeding, contractions, leaking of fluid and fetal movement were reviewed in detail with the patient. ?Please refer to After Visit Summary for other counseling recommendations.  ? ?No follow-ups on file. ? ?Future Appointments  ?Date Time Provider DeKidron?11/19/2021  9:00 AM CENTERING PROVIDER [facility] [place]?11/25/2021  7:30 AM [facility] NURSE [facility]  ?11/25/2021  7:45 AM [facility] US4 [facility]  ?12/17/2021  9:00 AM CENTERING PROVIDER [facility] [place]?01/14/2022  9:00 AM CENTERING PROVIDER [facility] [place]?01/28/2022  9:00 AM CENTERING PROVIDER [facility] [place]?02/11/2022  9:00 AM CENTERING PROVIDER [facility] [place]?02/25/2022  9:00 AM CENTERING PROVIDER [facility]?03/11/2022  9:00 AM CENTERING PROVIDER [facility] [place]?03/25/2022  9:00 AM CENTERING PROVIDER [facility] [place]?04/08/2022  9:00 AM CENTERING PROVIDER [facility] [place]? ? ?ViManya SilvasCNM ? ?

## 2021-11-01 ENCOUNTER — Other Ambulatory Visit: Payer: Self-pay

## 2021-11-01 ENCOUNTER — Encounter (HOSPITAL_COMMUNITY): Payer: Self-pay | Admitting: Obstetrics and Gynecology

## 2021-11-01 ENCOUNTER — Inpatient Hospital Stay (HOSPITAL_COMMUNITY)
Admission: AD | Admit: 2021-11-01 | Discharge: 2021-11-01 | Disposition: A | Discharge status: Home / Self Care | Payer: Medicaid Other | Attending: Obstetrics and Gynecology | Admitting: Obstetrics and Gynecology

## 2021-11-01 DIAGNOSIS — N898 Other specified noninflammatory disorders of vagina: Secondary | ICD-10-CM | POA: Diagnosis not present

## 2021-11-01 DIAGNOSIS — Z3A15 15 weeks gestation of pregnancy: Secondary | ICD-10-CM | POA: Insufficient documentation

## 2021-11-01 DIAGNOSIS — R0981 Nasal congestion: Secondary | ICD-10-CM | POA: Insufficient documentation

## 2021-11-01 DIAGNOSIS — O26892 Other specified pregnancy related conditions, second trimester: Secondary | ICD-10-CM | POA: Diagnosis not present

## 2021-11-01 DIAGNOSIS — O209 Hemorrhage in early pregnancy, unspecified: Secondary | ICD-10-CM | POA: Diagnosis not present

## 2021-11-01 DIAGNOSIS — O219 Vomiting of pregnancy, unspecified: Secondary | ICD-10-CM

## 2021-11-01 HISTORY — DX: Benign neoplasm of connective and other soft tissue, unspecified: D21.9

## 2021-11-01 LAB — WET PREP, GENITAL
Clue Cells Wet Prep HPF POC: NONE SEEN
Sperm: NONE SEEN
Trich, Wet Prep: NONE SEEN
WBC, Wet Prep HPF POC: >=10 — AB (ref <10)
Yeast Wet Prep HPF POC: NONE SEEN

## 2021-11-01 LAB — URINALYSIS, ROUTINE W REFLEX MICROSCOPIC
Bilirubin Urine: NEGATIVE
Glucose, UA: 50 mg/dL — AB
Hgb urine dipstick: NEGATIVE
Ketones, ur: 5 mg/dL — AB
Leukocytes,Ua: NEGATIVE
Nitrite: NEGATIVE
Protein, ur: 30 mg/dL — AB
Specific Gravity, Urine: 1.038 — ABNORMAL HIGH (ref 1.005–1.030)
pH: 5 (ref 5.0–8.0)

## 2021-11-01 MED ORDER — METOCLOPRAMIDE HCL 10 MG PO TABS: 10.0000 mg | ORAL_TABLET | Freq: Three times a day (TID) | ORAL | 2 refills | Status: DC | PRN

## 2021-11-01 MED ORDER — FLUTICASONE PROPIONATE 50 MCG/ACT NA SUSP: 2.0000 | Freq: Every day | NASAL | 2 refills | Status: DC

## 2021-11-01 NOTE — MAU Provider Note (Signed)
?History  ?  ? ?CSN: [id number] ? ?Arrival date and time: 11/01/21 1451 ? ? Event Date/Time  ? First Provider Initiated Contact with Patient 11/01/21 1530   ?  ? ?Chief Complaint  ?Patient presents with  ? Vaginal Bleeding  ? ?26 yo G4P1021 at 16w4dwith history of intramural and subserous fibroids presents with 2 days of intermittent vaginal spotting. Patient reports she is seeing a small amount of blood every time she wipes. She also endorses intermittent abdominal pain that feels similar to pain she has had from her fibroids in the past. Denies LOF/CTX. Denies HA, LE edema, or current abdominal pain. She has had persistent nausea/vomiting thus far this pregnancy which is ongoing. Denies recent sexual activity. ? ?She has also had worsening allergy symptoms recently, especially nasal congestion.  ? ? ?OB History   ? ? Gravida  ?4  ? Para  ?1  ? Term  ?1  ? Preterm  ?   ? AB  ?2  ? Living  ?1  ?  ? ? SAB  ?2  ? IAB  ?   ? Ectopic  ?   ? Multiple  ?0  ? Live Births  ?1  ?   ?  ?  ? ? ?Past Medical History:  ?Diagnosis Date  ? Fibroid   ? Fibroids   ? Gestational hypertension 05/12/2017  ? Insufficient weight gain during pregnancy 04/02/2017  ? Pregnancy induced hypertension   ? ? ?History reviewed. No pertinent surgical history. ? ?Family History  ?Problem Relation Age of Onset  ? Healthy Mother   ? Varicose Veins Father   ? Diabetes Paternal Grandmother   ? Hyperlipidemia Paternal Grandmother   ? ? ?Social History  ? ?Tobacco Use  ? Smoking status: Never  ? Smokeless tobacco: Never  ?Vaping Use  ? Vaping Use: Never used  ?Substance Use Topics  ? Alcohol use: No  ? Drug use: No  ? ? ?Allergies: No Known Allergies ? ?Medications Prior to Admission  ?Medication Sig Dispense Refill Last Dose  ? acetaminophen (TYLENOL) 325 MG tablet Take 325 mg by mouth every 6 (six) hours as needed.   10/31/2021  ? aspirin EC 81 MG tablet Take 1 tablet (81 mg total) by mouth daily. Swallow whole. Start taking on 10/14/21. 30 tablet 11  10/31/2021  ? ondansetron (ZOFRAN) 4 MG tablet Take 1 tablet (4 mg total) by mouth every 6 (six) hours as needed for nausea or vomiting. 20 tablet 3 10/31/2021  ? Prenatal Vit-Fe Fumarate-FA (PRENATAL COMPLETE) 14-0.4 MG TABS Take 1 tablet by mouth daily. 60 tablet 0 11/01/2021  ? Blood Pressure Monitoring DEVI 1 each by Does not apply route once a week. 1 each 0   ? Doxylamine-Pyridoxine ER (BONJESTA) 20-20 MG TBCR Take 1 tablet by mouth at bedtime. 60 tablet 6   ? metoCLOPramide (REGLAN) 10 MG tablet Take 1 tablet (10 mg total) by mouth every 8 (eight) hours as needed for nausea. (Patient not taking: Reported on 10/22/2021) 30 tablet 0   ? Misc. Devices (GOJJI WEIGHT SCALE) MISC 1 each by Does not apply route once a week. 1 each 0   ? ? ?Review of Systems  ?Constitutional:  Negative for activity change and appetite change.  ?HENT:  Positive for congestion, sinus pressure and sinus pain.   ?Eyes:  Negative for itching and visual disturbance.  ?Respiratory:  Negative for cough, choking, chest tightness and shortness of breath.   ?Cardiovascular:  Negative for chest pain  and leg swelling.  ?Gastrointestinal:  Positive for nausea and vomiting. Negative for abdominal pain, constipation and diarrhea.  ?Genitourinary:  Positive for vaginal bleeding and vaginal discharge. Negative for dysuria and vaginal pain.  ?Neurological:  Negative for dizziness, syncope and headaches.  ?Psychiatric/Behavioral:  Negative for confusion.   ?Physical Exam  ? ?Blood pressure 108/64, pulse (!) 102, temperature 98.3 ?F (36.8 ?C), temperature source Oral, resp. rate 16, height '5\' 5"'$  (1.651 m), weight 80.9 kg, SpO2 100 %, unknown if currently breastfeeding. ? ?Physical Exam ?Constitutional:   ?   General: She is not in acute distress. ?   Appearance: Normal appearance. She is not ill-appearing.  ?HENT:  ?   Head: Normocephalic and atraumatic.  ?   Right Ear: External ear normal.  ?   Left Ear: External ear normal.  ?   Nose: Congestion present.   ?Eyes:  ?   Extraocular Movements: Extraocular movements intact.  ?   Pupils: Pupils are equal, round, and reactive to light.  ?Cardiovascular:  ?   Rate and Rhythm: Normal rate and regular rhythm.  ?   Pulses: Normal pulses.  ?   Heart sounds: Normal heart sounds. No murmur heard. ?Pulmonary:  ?   Effort: Pulmonary effort is normal. No respiratory distress.  ?   Breath sounds: Normal breath sounds.  ?Abdominal:  ?   Tenderness: There is no abdominal tenderness.  ?Genitourinary: ?   Comments: Exterior vagina normal ?Speculum exam performed with visualization of pink, closed cervix with small amount of white/yellow discharge and no bleeding. Lateral vaginal walls normal.  ?Bimanual exam performed with no adnexal masses and closed cervix. ?Musculoskeletal:  ?   Cervical back: Normal range of motion and neck supple.  ?Neurological:  ?   Mental Status: She is alert.  ? ? ?MAU Course  ? ? ?MDM ?26 yo G4P1021 with hx of fibroids presenting for vaginal spotting ?VSS ?Reassuring speculum/bimanual exam ?Wet prep with no trich/candida/BV ?UA c/w dehydration, no sign of UTI ? ?Assessment and Plan  ?Vaginal Spotting ?Reassuring physical exam, negative wet prep.  ?Discussed bleeding return precautions ?Discharge home in stable condition ?Recommended routine OB follow up ? ?Seasonal Allergies ?Recent onset of nasal congestion with changing seasons ?Start Flonase 2 sprays daily ? ?Daniel Nones ?11/01/2021, 4:00 PM  ?

## 2021-11-01 NOTE — MAU Provider Note (Addendum)
?History  ?  ? ?CSN: [id number] ? ?Arrival date and time: 11/01/21 1451 ? ? Event Date/Time  ? First Provider Initiated Contact with Patient 11/01/21 1530   ?  ? ?Chief Complaint  ?Patient presents with  ? Vaginal Bleeding  ? ?HPI ?Lisa Liu is a 26 y.o. [id number] at 61w4dwho presents with brown spotting when she wipes for 2 days. She reports this is similar to the spotting she saw in previous MAU visits. She reports it has improved since her arrival to MAU but she was worried something was wrong. She reports intermittent abdominal pain that is consistent with the daily pain she has. She denies any leaking of fluid. She reports she is still struggling with nausea and vomiting but the zofran has helped some.  ? ?OB History   ? ? Gravida  ?4  ? Para  ?1  ? Term  ?1  ? Preterm  ?   ? AB  ?2  ? Living  ?1  ?  ? ? SAB  ?2  ? IAB  ?   ? Ectopic  ?   ? Multiple  ?0  ? Live Births  ?1  ?   ?  ?  ? ? ?Past Medical History:  ?Diagnosis Date  ? Fibroid   ? Fibroids   ? Gestational hypertension 05/12/2017  ? Insufficient weight gain during pregnancy 04/02/2017  ? Pregnancy induced hypertension   ? ? ?History reviewed. No pertinent surgical history. ? ?Family History  ?Problem Relation Age of Onset  ? Healthy Mother   ? Varicose Veins Father   ? Diabetes Paternal Grandmother   ? Hyperlipidemia Paternal Grandmother   ? ? ?Social History  ? ?Tobacco Use  ? Smoking status: Never  ? Smokeless tobacco: Never  ?Vaping Use  ? Vaping Use: Never used  ?Substance Use Topics  ? Alcohol use: No  ? Drug use: No  ? ? ?Allergies: No Known Allergies ? ?Medications Prior to Admission  ?Medication Sig Dispense Refill Last Dose  ? acetaminophen (TYLENOL) 325 MG tablet Take 325 mg by mouth every 6 (six) hours as needed.   10/31/2021  ? aspirin EC 81 MG tablet Take 1 tablet (81 mg total) by mouth daily. Swallow whole. Start taking on 10/14/21. 30 tablet 11 10/31/2021  ? ondansetron (ZOFRAN) 4 MG tablet Take 1 tablet (4 mg total) by mouth  every 6 (six) hours as needed for nausea or vomiting. 20 tablet 3 10/31/2021  ? Prenatal Vit-Fe Fumarate-FA (PRENATAL COMPLETE) 14-0.4 MG TABS Take 1 tablet by mouth daily. 60 tablet 0 11/01/2021  ? Blood Pressure Monitoring DEVI 1 each by Does not apply route once a week. 1 each 0   ? Doxylamine-Pyridoxine ER (BONJESTA) 20-20 MG TBCR Take 1 tablet by mouth at bedtime. 60 tablet 6   ? metoCLOPramide (REGLAN) 10 MG tablet Take 1 tablet (10 mg total) by mouth every 8 (eight) hours as needed for nausea. (Patient not taking: Reported on 10/22/2021) 30 tablet 0   ? Misc. Devices (GOJJI WEIGHT SCALE) MISC 1 each by Does not apply route once a week. 1 each 0   ? ? ?Review of Systems  ?Constitutional: Negative.  Negative for fatigue and fever.  ?HENT: Negative.    ?Respiratory: Negative.  Negative for shortness of breath.   ?Cardiovascular: Negative.  Negative for chest pain.  ?Gastrointestinal: Negative.  Negative for abdominal pain, constipation, diarrhea, nausea and vomiting.  ?Genitourinary:  Positive for vaginal discharge. Negative for dysuria and  vaginal bleeding.  ?Neurological: Negative.  Negative for dizziness and headaches.  ?Physical Exam  ? ?Blood pressure 108/64, pulse (!) 102, temperature 98.3 ?F (36.8 ?C), temperature source Oral, resp. rate 16, height '5\' 5"'$  (1.651 m), weight 80.9 kg, SpO2 100 %, unknown if currently breastfeeding. ? ?Physical Exam ?Vitals and nursing note reviewed.  ?Constitutional:   ?   General: She is not in acute distress. ?   Appearance: She is well-developed.  ?HENT:  ?   Head: Normocephalic.  ?Eyes:  ?   Pupils: Pupils are equal, round, and reactive to light.  ?Cardiovascular:  ?   Rate and Rhythm: Normal rate and regular rhythm.  ?   Heart sounds: Normal heart sounds.  ?Pulmonary:  ?   Effort: Pulmonary effort is normal. No respiratory distress.  ?   Breath sounds: Normal breath sounds.  ?Abdominal:  ?   General: Bowel sounds are normal. There is no distension.  ?   Palpations: Abdomen  is soft.  ?   Tenderness: There is no abdominal tenderness.  ?Genitourinary: ?   Comments: SSE: small amount of brown discharge, cervix visually closed ?Skin: ?   General: Skin is warm and dry.  ?Neurological:  ?   Mental Status: She is alert and oriented to person, place, and time.  ?Psychiatric:     ?   Mood and Affect: Mood normal.     ?   Behavior: Behavior normal.     ?   Thought Content: Thought content normal.     ?   Judgment: Judgment normal.  ? ?FHT: 160 bpm ? ?MAU Course  ?Procedures ?Results for orders placed or performed during the hospital encounter of 11/01/21 (from the past 24 hour(s))  ?Urinalysis, Routine w reflex microscopic Urine, Clean Catch     Status: Abnormal  ? Collection Time: 11/01/21  3:15 PM  ?Result Value Ref Range  ? Color, Urine AMBER (A) YELLOW  ? APPearance HAZY (A) CLEAR  ? Specific Gravity, Urine 1.038 (H) 1.005 - 1.030  ? pH 5.0 5.0 - 8.0  ? Glucose, UA 50 (A) NEGATIVE mg/dL  ? Hgb urine dipstick NEGATIVE NEGATIVE  ? Bilirubin Urine NEGATIVE NEGATIVE  ? Ketones, ur 5 (A) NEGATIVE mg/dL  ? Protein, ur 30 (A) NEGATIVE mg/dL  ? Nitrite NEGATIVE NEGATIVE  ? Leukocytes,Ua NEGATIVE NEGATIVE  ? RBC / HPF 0-5 0 - 5 RBC/hpf  ? WBC, UA 0-5 0 - 5 WBC/hpf  ? Bacteria, UA FEW (A) NONE SEEN  ? Squamous Epithelial / LPF 0-5 0 - 5  ? Mucus PRESENT   ?Wet prep, genital     Status: Abnormal  ? Collection Time: 11/01/21  3:56 PM  ? Specimen: Vaginal  ?Result Value Ref Range  ? Yeast Wet Prep HPF POC NONE SEEN NONE SEEN  ? Trich, Wet Prep NONE SEEN NONE SEEN  ? Clue Cells Wet Prep HPF POC NONE SEEN NONE SEEN  ? WBC, Wet Prep HPF POC >=10 (A) <10  ? Sperm NONE SEEN   ?  ?MDM ?UA ?Wet prep  ? ?Reassurance provided based on exam and warning signs of bleeding reviewed at length.  ? ?Assessment and Plan  ? ?1. Vaginal discharge during pregnancy in second trimester   ?2. [redacted] weeks gestation of pregnancy   ?3. Nausea/vomiting in pregnancy   ? ?-Discharge home in stable condition ?-Rx for reglan and flonase  sent to patient's pharmacy ?-Bleeding precautions discussed ?-Patient advised to follow-up with OB as scheduled for prenatal  care ?-Patient may return to MAU as needed or if her condition were to change or worsen ? ? ?Wende Mott CNM ?11/01/2021, 3:31 PM  ?

## 2021-11-01 NOTE — Discharge Instructions (Signed)

## 2021-11-01 NOTE — MAU Note (Signed)
Lisa Liu is a 26 y.o. at 59w4dhere in MAU reporting: had spotting in the past, started again 2 days ago brownish, spotting only - no clots.  Not having any pain or cramping. Was told in the past everything was fine, just worries her ? ?Onset of complaint: 2 days ago ?Pain score: none ?Vitals:  ? 11/01/21 1523  ?BP: 108/64  ?Pulse: (!) 102  ?Resp: 16  ?Temp: 98.3 ?F (36.8 ?C)  ?SpO2: 100%  ?   ?FHT:160 ?Lab orders placed from triage:  urine ?

## 2021-11-02 DIAGNOSIS — Z419 Encounter for procedure for purposes other than remedying health state, unspecified: Secondary | ICD-10-CM | POA: Diagnosis not present

## 2021-11-19 ENCOUNTER — Ambulatory Visit (INDEPENDENT_AMBULATORY_CARE_PROVIDER_SITE_OTHER): Payer: Medicaid Other | Admitting: Advanced Practice Midwife

## 2021-11-19 VITALS — BP 114/72 | HR 101 | Wt 171.8 lb

## 2021-11-19 DIAGNOSIS — Z3480 Encounter for supervision of other normal pregnancy, unspecified trimester: Secondary | ICD-10-CM

## 2021-11-19 DIAGNOSIS — O219 Vomiting of pregnancy, unspecified: Secondary | ICD-10-CM

## 2021-11-19 DIAGNOSIS — D252 Subserosal leiomyoma of uterus: Secondary | ICD-10-CM

## 2021-11-19 DIAGNOSIS — D251 Intramural leiomyoma of uterus: Secondary | ICD-10-CM

## 2021-11-19 MED ORDER — ONDANSETRON HCL 8 MG PO TABS: 8.0000 mg | ORAL_TABLET | Freq: Three times a day (TID) | ORAL | 6 refills | Status: DC | PRN

## 2021-11-20 ENCOUNTER — Encounter: Payer: Self-pay | Admitting: Advanced Practice Midwife

## 2021-11-20 NOTE — Progress Notes (Signed)
? ?  PRENATAL VISIT NOTE ? ?Subjective:  ?Lisa Liu is a 26 y.o. [id number] at 43w2dbeing seen today for ongoing prenatal care.  She is currently monitored for the following issues for this low-risk pregnancy and has Fibroid uterus; Supervision of other normal pregnancy, antepartum; and Ovarian cyst during pregnancy in first trimester on their problem list. ? ?Patient reports  pain on right side of abd that she attributes to fibroids .  Contractions: Not present. Vag. Bleeding: None.  Movement: Absent. Denies leaking of fluid.  ? ?The following portions of the patient's history were reviewed and updated as appropriate: allergies, current medications, past family history, past medical history, past social history, past surgical history and problem list.  ? ?Objective:  ? ?Vitals:  ? 11/19/21 0927  ?BP: 114/72  ?Pulse: (!) 101  ?Weight: 171 lb 12.8 oz (77.9 kg)  ? ? ?Fetal Status: Fetal Heart Rate (bpm): 153 Fundal Height: 19 cm Movement: Absent    ? ?General:  Alert, oriented and cooperative. Patient is in no acute distress.  ?Skin: Skin is warm and dry. No rash noted.   ?Cardiovascular: Normal heart rate noted  ?Respiratory: Normal respiratory effort, no problems with respiration noted  ?Abdomen: Soft, gravid, appropriate for gestational age.  Pain/Pressure: Present     ?Pelvic: Cervical exam deferredDelined  ?Extremities: Normal range of motion.  Edema: None  ?Mental Status: Normal mood and affect. Normal behavior. Normal judgment and thought content.  ? ?Assessment and Plan:  ?Pregnancy: [id number] 116w2d1. Nausea/vomiting in pregnancy ? ?- ondansetron (ZOFRAN) 8 MG tablet; Take 1 tablet (8 mg total) by mouth every 8 (eight) hours as needed for nausea or vomiting.  Dispense: 30 tablet; Refill: 6 ? ?2. Supervision of other normal pregnancy, antepartum ?- Routine CenteringPregnancy care ?- requests to move AFP lab appt to afternoon of 4/28 due to work schedule. In-basket msg sent to front desk.  ? ?3.  Intramural and subserous leiomyoma of uterus ?- F/U at Anatomy [place] ?Preterm labor symptoms and general obstetric precautions including but not limited to vaginal bleeding, contractions, leaking of fluid and fetal movement were reviewed in detail with the patient. ?Please refer to After Visit Summary for other counseling recommendations.  ? ? ?Centering Pregnancy, Session#2: Reviewed rules for self-governance with group. Direct group to [employer] ? ?Facilitated discussion today:  Nutrition, Back pain, Genetic screening options with AFP/Quad.  ? ?Mindfulness activity completed   Also participated in mindful eating activity and plate activity.  ?Activity regarding normal and abnormal causes of abdominal pain in second trimester discussed.  ? ?Fundal height and FHR appropriate today unless noted otherwise in plan. Patient to continue group care.  ? ?No follow-ups on file. ? ?Future Appointments  ?Date Time Provider [facility] [employer]?11/25/2021  7:30 AM WMC-MFC NURSE WMC-MFC WMC  ?11/25/2021  7:45 AM WMC-MFC US4 WMC-MFCUS WMC  ?12/17/2021  9:00 AM CENTERING PROVIDER WMC-CWH W[place]?01/14/2022  9:00 AM CENTERING PROVIDER WMC-CWH WME[place]?01/28/2022  9:00 AM CENTERING PROVIDER WMC-CWH WMP[place]?02/11/2022  9:00 AM CENTERING PROVIDER WMC-CWH WMHamilton?02/25/2022  9:00 AM CENTERING PROVIDER WMC-CWH WMSmoaks?03/11/2022  9:00 AM CENTERING PROVIDER WMC-CWH WMM[place]?03/25/2022  9:00 AM CENTERING PROVIDER WMC-CWH WMForrest?04/08/2022  9:00 AM CENTERING PROVIDER WMC-CWH WMJoppatow[place]? ? ?ViManya SilvasCNM ?

## 2021-11-25 ENCOUNTER — Other Ambulatory Visit: Payer: Self-pay | Admitting: *Deleted

## 2021-11-25 ENCOUNTER — Ambulatory Visit: Payer: Medicaid Other | Attending: Family Medicine

## 2021-11-25 ENCOUNTER — Ambulatory Visit: Payer: Medicaid Other | Admitting: *Deleted

## 2021-11-25 ENCOUNTER — Encounter: Payer: Self-pay | Admitting: *Deleted

## 2021-11-25 VITALS — BP 116/71 | HR 86

## 2021-11-25 DIAGNOSIS — Z8759 Personal history of other complications of pregnancy, childbirth and the puerperium: Secondary | ICD-10-CM

## 2021-11-25 DIAGNOSIS — Z3480 Encounter for supervision of other normal pregnancy, unspecified trimester: Secondary | ICD-10-CM | POA: Insufficient documentation

## 2021-11-25 DIAGNOSIS — Z363 Encounter for antenatal screening for malformations: Secondary | ICD-10-CM | POA: Insufficient documentation

## 2021-11-25 DIAGNOSIS — D259 Leiomyoma of uterus, unspecified: Secondary | ICD-10-CM

## 2021-11-25 DIAGNOSIS — Z362 Encounter for other antenatal screening follow-up: Secondary | ICD-10-CM

## 2021-11-25 DIAGNOSIS — Z3A19 19 weeks gestation of pregnancy: Secondary | ICD-10-CM | POA: Insufficient documentation

## 2021-11-25 DIAGNOSIS — O3412 Maternal care for benign tumor of corpus uteri, second trimester: Secondary | ICD-10-CM | POA: Diagnosis not present

## 2021-11-25 DIAGNOSIS — O09292 Supervision of pregnancy with other poor reproductive or obstetric history, second trimester: Secondary | ICD-10-CM | POA: Diagnosis not present

## 2021-11-25 DIAGNOSIS — O132 Gestational [pregnancy-induced] hypertension without significant proteinuria, second trimester: Secondary | ICD-10-CM

## 2021-12-02 DIAGNOSIS — Z419 Encounter for procedure for purposes other than remedying health state, unspecified: Secondary | ICD-10-CM | POA: Diagnosis not present

## 2021-12-10 ENCOUNTER — Encounter: Payer: Self-pay | Admitting: Advanced Practice Midwife

## 2021-12-17 ENCOUNTER — Ambulatory Visit (INDEPENDENT_AMBULATORY_CARE_PROVIDER_SITE_OTHER): Payer: Medicaid Other | Admitting: Advanced Practice Midwife

## 2021-12-17 VITALS — BP 117/71 | HR 105 | Wt 172.0 lb

## 2021-12-17 DIAGNOSIS — D251 Intramural leiomyoma of uterus: Secondary | ICD-10-CM

## 2021-12-17 DIAGNOSIS — D252 Subserosal leiomyoma of uterus: Secondary | ICD-10-CM

## 2021-12-17 DIAGNOSIS — Z3480 Encounter for supervision of other normal pregnancy, unspecified trimester: Secondary | ICD-10-CM

## 2021-12-17 DIAGNOSIS — O26892 Other specified pregnancy related conditions, second trimester: Secondary | ICD-10-CM

## 2021-12-17 DIAGNOSIS — R12 Heartburn: Secondary | ICD-10-CM

## 2021-12-17 MED ORDER — FAMOTIDINE 20 MG PO TABS: 20.0000 mg | ORAL_TABLET | Freq: Two times a day (BID) | ORAL | 6 refills | Status: DC

## 2021-12-17 NOTE — Progress Notes (Signed)
? ?  PRENATAL VISIT NOTE ? ?Subjective:  ?Lisa Liu is a 26 y.o. G4P1021 at 1w1dbeing seen today for ongoing prenatal care.  She is currently monitored for the following issues for this high-risk pregnancy and has Fibroid uterus; Supervision of other normal pregnancy, antepartum; and Ovarian cyst during pregnancy in first trimester on their problem list. ? ?Patient reports heartburn.  Contractions: Not present. Vag. Bleeding: None.  Movement: Absent. Denies leaking of fluid.  ? ?The following portions of the patient's history were reviewed and updated as appropriate: allergies, current medications, past family history, past medical history, past social history, past surgical history and problem list.  ? ?Objective:  ? ?Vitals:  ? 12/17/21 0916  ?BP: 117/71  ?Pulse: (!) 105  ?Weight: 172 lb (78 kg)  ? ? ?Fetal Status: Fetal Heart Rate (bpm): 141 Fundal Height: 22 cm Movement: Absent    ? ?General:  Alert, oriented and cooperative. Patient is in no acute distress.  ?Skin: Skin is warm and dry. No rash noted.   ?Cardiovascular: Normal heart rate noted  ?Respiratory: Normal respiratory effort, no problems with respiration noted  ?Abdomen: Soft, gravid, appropriate for gestational age.  Pain/Pressure: Absent     ?Pelvic: Cervical exam deferred        ?Extremities: Normal range of motion.  Edema: None  ?Mental Status: Normal mood and affect. Normal behavior. Normal judgment and thought content.  ? ?Assessment and Plan:  ?Pregnancy: [id number] 231w1d1. Supervision of other normal pregnancy, antepartum ?- GTT Scheduled  ?- Anatomy US[place] but incomplete. F/U scheduled. ? ?2. Intramural and subserous leiomyoma of uterus ?- Plan growth US's Q 4 weeks.  ? ?5 Heartburn in pregnancy ?- Rx Pepcid ?- Dietary recommendations ? ? ?Centering Pregnancy, Session#3: Reviewed resources in [employer] ? ?Facilitated discussion today:  ?- "The Family I Want to Have" traditions/parenting practices to continue and  discontinue ?- Back pain management stretches, rice socks ?- Breastfeeding  challenges ?- Mindfulness activity  ?- Nutrition as it pertains to anemia and swelling ?- TDaP recommendation ? ?Fundal height and FHR appropriate today unless noted otherwise in plan. Patient to continue group care.  ? ? ?Preterm labor symptoms and general obstetric precautions including but not limited to vaginal bleeding, contractions, leaking of fluid and fetal movement were reviewed in detail with the patient. ?Please refer to After Visit Summary for other counseling recommendations.  ? ?No follow-ups on file. ? ?Future Appointments  ?Date Time Provider [place]?12/23/2021  2:45 PM WMC-MFC NURSE WMC-MFC WMC  ?12/23/2021  3:00 PM WMC-MFC US1 WMC-MFCUS WMC  ?01/14/2022  9:00 AM CENTERING PROVIDER WMC-CWH WMPine?01/28/2022  9:00 AM CENTERING PROVIDER WMC-CWH WM[place]?02/11/2022  9:00 AM CENTERING PROVIDER WMC-CWH WMB[place]?02/25/2022  9:00 AM CENTERING PROVIDER WMC-CWH WM[place]?03/11/2022  9:00 AM CENTERING PROVIDER WMC-CWH WMPlate[place]?03/25/2022  9:00 AM CENTERING PROVIDER WMC-CWH WMPotom[place]?04/08/2022  9:00 AM CENTERING PROVIDER WMC-CWH WMArmstrong? ? ?ViManya SilvasCNM ?

## 2021-12-23 ENCOUNTER — Encounter: Payer: Self-pay | Admitting: *Deleted

## 2021-12-23 ENCOUNTER — Ambulatory Visit: Payer: Medicaid Other | Admitting: *Deleted

## 2021-12-23 ENCOUNTER — Ambulatory Visit: Payer: Medicaid Other | Attending: Obstetrics

## 2021-12-23 VITALS — BP 109/63 | HR 94

## 2021-12-23 DIAGNOSIS — Z362 Encounter for other antenatal screening follow-up: Secondary | ICD-10-CM

## 2021-12-23 DIAGNOSIS — O3412 Maternal care for benign tumor of corpus uteri, second trimester: Secondary | ICD-10-CM | POA: Insufficient documentation

## 2021-12-23 DIAGNOSIS — Z8759 Personal history of other complications of pregnancy, childbirth and the puerperium: Secondary | ICD-10-CM | POA: Insufficient documentation

## 2021-12-23 DIAGNOSIS — O09292 Supervision of pregnancy with other poor reproductive or obstetric history, second trimester: Secondary | ICD-10-CM | POA: Insufficient documentation

## 2021-12-23 DIAGNOSIS — Z3A23 23 weeks gestation of pregnancy: Secondary | ICD-10-CM | POA: Insufficient documentation

## 2021-12-23 DIAGNOSIS — D259 Leiomyoma of uterus, unspecified: Secondary | ICD-10-CM | POA: Diagnosis not present

## 2021-12-23 DIAGNOSIS — Z3480 Encounter for supervision of other normal pregnancy, unspecified trimester: Secondary | ICD-10-CM | POA: Insufficient documentation

## 2021-12-23 DIAGNOSIS — O321XX0 Maternal care for breech presentation, not applicable or unspecified: Secondary | ICD-10-CM | POA: Insufficient documentation

## 2021-12-24 ENCOUNTER — Other Ambulatory Visit: Payer: Self-pay | Admitting: *Deleted

## 2021-12-24 DIAGNOSIS — O3412 Maternal care for benign tumor of corpus uteri, second trimester: Secondary | ICD-10-CM

## 2022-01-02 DIAGNOSIS — Z419 Encounter for procedure for purposes other than remedying health state, unspecified: Secondary | ICD-10-CM | POA: Diagnosis not present

## 2022-01-04 ENCOUNTER — Inpatient Hospital Stay (HOSPITAL_COMMUNITY)
Admission: AD | Admit: 2022-01-04 | Discharge: 2022-01-04 | Disposition: A | Discharge status: Home / Self Care | Payer: Medicaid Other | Attending: Obstetrics and Gynecology | Admitting: Obstetrics and Gynecology

## 2022-01-04 ENCOUNTER — Other Ambulatory Visit: Payer: Self-pay

## 2022-01-04 DIAGNOSIS — Z3A24 24 weeks gestation of pregnancy: Secondary | ICD-10-CM | POA: Insufficient documentation

## 2022-01-04 DIAGNOSIS — O99012 Anemia complicating pregnancy, second trimester: Secondary | ICD-10-CM | POA: Insufficient documentation

## 2022-01-04 LAB — CBC
HCT: 30.4 % — ABNORMAL LOW (ref 36.0–46.0)
Hemoglobin: 10 g/dL — ABNORMAL LOW (ref 12.0–15.0)
MCH: 25.7 pg — ABNORMAL LOW (ref 26.0–34.0)
MCHC: 32.9 g/dL (ref 30.0–36.0)
MCV: 78.1 fL — ABNORMAL LOW (ref 80.0–100.0)
Platelets: 334 10*3/uL (ref 150–400)
RBC: 3.89 MIL/uL (ref 3.87–5.11)
RDW: 16.6 % — ABNORMAL HIGH (ref 11.5–15.5)
WBC: 6.1 10*3/uL (ref 4.0–10.5)
nRBC: 0 % (ref 0.0–0.2)

## 2022-01-04 MED ORDER — FERROUS SULFATE 325 (65 FE) MG PO TABS: 325.0000 mg | ORAL_TABLET | ORAL | 2 refills | Status: DC

## 2022-01-04 NOTE — Discharge Instructions (Signed)

## 2022-01-04 NOTE — MAU Provider Note (Signed)
Event Date/Time   First Provider Initiated Contact with Patient 01/04/22 1317      S [name] is a 26 y.o. [phone] patient who presents to MAU today with complaint of fatigue. She reports she just feels more tired the last 2 days. She denies any pain, vaginal bleeding or discharge. She reports normal fetal movement.   O BP 114/68 (BP Location: Right Arm)   Pulse 89   Temp 97.9 F (36.6 C) (Oral)   Resp 16   Ht '5\' 5"'$  (1.651 m)   Wt 78.2 kg   LMP  (LMP Unknown)   SpO2 97% Comment: room air  BMI 28.67 kg/m  Physical Exam Vitals and nursing note reviewed.  Constitutional:      General: She is not in acute distress.    Appearance: She is well-developed.  HENT:     Head: Normocephalic.  Eyes:     Pupils: Pupils are equal, round, and reactive to light.  Cardiovascular:     Rate and Rhythm: Normal rate and regular rhythm.     Heart sounds: Normal heart sounds.  Pulmonary:     Effort: Pulmonary effort is normal. No respiratory distress.     Breath sounds: Normal breath sounds.  Abdominal:     General: Bowel sounds are normal. There is no distension.     Palpations: Abdomen is soft.     Tenderness: There is no abdominal tenderness.  Skin:    General: Skin is warm and dry.  Neurological:     Mental Status: She is alert and oriented to person, place, and time.  Psychiatric:        Mood and Affect: Mood normal.        Behavior: Behavior normal.        Thought Content: Thought content normal.        Judgment: Judgment normal.   FHT: 148 bpm  Results for orders placed or performed during the hospital encounter of 01/04/22 (from the past 24 hour(s))  CBC     Status: Abnormal   Collection Time: 01/04/22  1:31 PM  Result Value Ref Range   WBC 6.1 4.0 - 10.5 K/uL   RBC 3.89 3.87 - 5.11 MIL/uL   Hemoglobin 10.0 (L) 12.0 - 15.0 g/dL   HCT 30.4 (L) 36.0 - 46.0 %   MCV 78.1 (L) 80.0 - 100.0 fL   MCH 25.7 (L) 26.0 - 34.0 pg   MCHC 32.9 30.0 - 36.0 g/dL   RDW  16.6 (H) 11.5 - 15.5 %   Platelets 334 150 - 400 K/uL   nRBC 0.0 0.0 - 0.2 %     A Medical screening exam complete 1. Anemia during pregnancy in second trimester   2. [redacted] weeks gestation of pregnancy      P -Discharge home in stable condition -Rx for iron pills sent to patient's pharmacy -Second trimester precautions discussed -Patient advised to follow-up with OB as scheduled for prenatal care -Patient may return to MAU as needed or if her condition were to change or worsen   Wende Mott, [place] 01/04/2022 1:17 PM

## 2022-01-04 NOTE — MAU Note (Signed)
Lisa Liu is a 26 y.o. at 85w5dhere in MAU reporting: for 2 days has been feeling fatigued. No pain, bleeding, or LOF. +FM  Onset of complaint: 2 days  Pain score: 0/10  Vitals:   01/04/22 1315  BP: 114/68  Pulse: 89  Resp: 16  Temp: 97.9 F (36.6 C)  SpO2: 97%     FHT:148  Lab orders placed from triage: none

## 2022-01-13 ENCOUNTER — Other Ambulatory Visit: Payer: Self-pay | Admitting: *Deleted

## 2022-01-13 DIAGNOSIS — Z3480 Encounter for supervision of other normal pregnancy, unspecified trimester: Secondary | ICD-10-CM

## 2022-01-14 ENCOUNTER — Other Ambulatory Visit: Payer: Medicaid Other

## 2022-01-14 ENCOUNTER — Ambulatory Visit (INDEPENDENT_AMBULATORY_CARE_PROVIDER_SITE_OTHER): Payer: Medicaid Other | Admitting: Advanced Practice Midwife

## 2022-01-14 VITALS — BP 112/63 | HR 93 | Wt 172.4 lb

## 2022-01-14 DIAGNOSIS — Z3A26 26 weeks gestation of pregnancy: Secondary | ICD-10-CM

## 2022-01-14 DIAGNOSIS — O2612 Low weight gain in pregnancy, second trimester: Secondary | ICD-10-CM

## 2022-01-14 DIAGNOSIS — Z23 Encounter for immunization: Secondary | ICD-10-CM | POA: Diagnosis not present

## 2022-01-14 DIAGNOSIS — Z3480 Encounter for supervision of other normal pregnancy, unspecified trimester: Secondary | ICD-10-CM

## 2022-01-14 NOTE — Progress Notes (Signed)
   PRENATAL VISIT NOTE  Subjective:  Lisa Liu is a 26 y.o. G4P1021 at 76w1dbeing seen today for ongoing prenatal care.  She is currently monitored for the following issues for this low-risk pregnancy and has Fibroid uterus; Supervision of other normal pregnancy, antepartum; and Ovarian cyst during pregnancy in first trimester on their problem list.  Patient reports no complaints. Able to eat and drink well.  Contractions: Not present. Vag. Bleeding: None.  Movement: Present. Denies leaking of fluid.   The following portions of the patient's history were reviewed and updated as appropriate: allergies, current medications, past family history, past medical history, past social history, past surgical history and problem list.   Objective:   Vitals:   01/14/22 0912  BP: 112/63  Pulse: 93  Weight: 172 lb 6.4 oz (78.2 kg)    Fetal Status: Fetal Heart Rate (bpm): 140 Fundal Height: 28 cm Movement: Present  Presentation: Vertex  General:  Alert, oriented and cooperative. Patient is in no acute distress.  Skin: Skin is warm and dry. No rash noted.   Cardiovascular: Normal heart rate noted  Respiratory: Normal respiratory effort, no problems with respiration noted  Abdomen: Soft, gravid, appropriate for gestational age.  Pain/Pressure: Absent     Pelvic: Cervical exam deferred        Extremities: Normal range of motion.  Edema: None  Mental Status: Normal mood and affect. Normal behavior. Normal judgment and thought content.     Centering Pregnancy, Session#4: Reviewed resources in [employer]   Facilitated discussion today:  Family dynamics/environment, family planning postpartum, and preterm labor Mindfulness activity completed as well as deep breathing with hand massage for childbirth preparation.    Fundal height and FHR appropriate today unless noted otherwise in plan. Patient to continue group care.   Assessment and Plan:  Pregnancy: G4P1021 at 269w1d. [redacted]  weeks gestation of pregnancy - 28 week labs scheduled - Tdap vaccine greater than or equal to 7yo IM  2. Supervision of other normal pregnancy, antepartum - 28 week labs scheduled - F/U USKoreancomplete anatomy   3. Fibroid uterus  - F/U US[place]4. Poor weight gain pregnancy  - F/U USKoreaPreterm labor symptoms and general obstetric precautions including but not limited to vaginal bleeding, contractions, leaking of fluid and fetal movement were reviewed in detail with the patient. Please refer to After Visit Summary for other counseling recommendations.   No follow-ups on file.  Future Appointments  Date Time Provider [place]6/16/2023  8:20 AM WMC-WOCA LAB [facility]2023 12:30 PM [facility]MC[facility]MFC NURSE [facility]/19/2023 12:45 PM W[facility]MFC US5 [facility]6/27/2023  9:00 AM CENTERING PROVIDER [facility]7/06/2022  9:00 AM CENTERING PROVIDER [facility]7/25/2023  9:00 AM CENTERING PROVIDER [facility]8/03/2022  9:00 AM CENTERING PROVIDER [facility]MM[facility] [place]8/22/2023  9:00 AM CENTERING PROVIDER [facility]/12/2021  9:00 AM CENTERING PROVIDER WMC-CWH WMNatural StepsCNM

## 2022-01-17 ENCOUNTER — Other Ambulatory Visit: Payer: Medicaid Other

## 2022-01-17 ENCOUNTER — Other Ambulatory Visit: Payer: Self-pay

## 2022-01-17 DIAGNOSIS — Z3480 Encounter for supervision of other normal pregnancy, unspecified trimester: Secondary | ICD-10-CM | POA: Diagnosis not present

## 2022-01-18 LAB — CBC
Hematocrit: 31.5 % — ABNORMAL LOW (ref 34.0–46.6)
Hemoglobin: 10.4 g/dL — ABNORMAL LOW (ref 11.1–15.9)
MCH: 25.7 pg — ABNORMAL LOW (ref 26.6–33.0)
MCHC: 33 g/dL (ref 31.5–35.7)
MCV: 78 fL — ABNORMAL LOW (ref 79–97)
Platelets: 348 10*3/uL (ref 150–450)
RBC: 4.04 x10E6/uL (ref 3.77–5.28)
RDW: 16.1 % — ABNORMAL HIGH (ref 11.7–15.4)
WBC: 5.1 10*3/uL (ref 3.4–10.8)

## 2022-01-18 LAB — RPR: RPR Ser Ql: NONREACTIVE

## 2022-01-18 LAB — HIV ANTIBODY (ROUTINE TESTING W REFLEX): HIV Screen 4th Generation wRfx: NONREACTIVE

## 2022-01-18 LAB — GLUCOSE TOLERANCE, 2 HOURS W/ 1HR
Glucose, 1 hour: 107 mg/dL (ref 70–179)
Glucose, 2 hour: 83 mg/dL (ref 70–152)
Glucose, Fasting: 70 mg/dL (ref 70–91)

## 2022-01-20 ENCOUNTER — Ambulatory Visit: Payer: Medicaid Other | Admitting: *Deleted

## 2022-01-20 ENCOUNTER — Encounter: Payer: Self-pay | Admitting: *Deleted

## 2022-01-20 ENCOUNTER — Other Ambulatory Visit: Payer: Self-pay | Admitting: *Deleted

## 2022-01-20 ENCOUNTER — Ambulatory Visit: Payer: Medicaid Other | Attending: Obstetrics

## 2022-01-20 VITALS — BP 106/59 | HR 98

## 2022-01-20 DIAGNOSIS — D259 Leiomyoma of uterus, unspecified: Secondary | ICD-10-CM

## 2022-01-20 DIAGNOSIS — Z3689 Encounter for other specified antenatal screening: Secondary | ICD-10-CM

## 2022-01-20 DIAGNOSIS — O09293 Supervision of pregnancy with other poor reproductive or obstetric history, third trimester: Secondary | ICD-10-CM

## 2022-01-20 DIAGNOSIS — Z3A27 27 weeks gestation of pregnancy: Secondary | ICD-10-CM

## 2022-01-20 DIAGNOSIS — Z362 Encounter for other antenatal screening follow-up: Secondary | ICD-10-CM | POA: Diagnosis not present

## 2022-01-20 DIAGNOSIS — O09292 Supervision of pregnancy with other poor reproductive or obstetric history, second trimester: Secondary | ICD-10-CM | POA: Diagnosis not present

## 2022-01-20 DIAGNOSIS — O3412 Maternal care for benign tumor of corpus uteri, second trimester: Secondary | ICD-10-CM

## 2022-01-20 DIAGNOSIS — O3413 Maternal care for benign tumor of corpus uteri, third trimester: Secondary | ICD-10-CM

## 2022-01-28 ENCOUNTER — Ambulatory Visit (INDEPENDENT_AMBULATORY_CARE_PROVIDER_SITE_OTHER): Payer: Medicaid Other | Admitting: Advanced Practice Midwife

## 2022-01-28 ENCOUNTER — Encounter: Payer: Self-pay | Admitting: Advanced Practice Midwife

## 2022-01-28 VITALS — BP 103/70 | HR 102 | Wt 166.2 lb

## 2022-01-28 DIAGNOSIS — Z3A28 28 weeks gestation of pregnancy: Secondary | ICD-10-CM

## 2022-01-28 DIAGNOSIS — D252 Subserosal leiomyoma of uterus: Secondary | ICD-10-CM

## 2022-01-28 DIAGNOSIS — O2612 Low weight gain in pregnancy, second trimester: Secondary | ICD-10-CM

## 2022-01-28 DIAGNOSIS — D251 Intramural leiomyoma of uterus: Secondary | ICD-10-CM

## 2022-01-28 DIAGNOSIS — Z3480 Encounter for supervision of other normal pregnancy, unspecified trimester: Secondary | ICD-10-CM

## 2022-01-28 NOTE — Progress Notes (Signed)
   PRENATAL VISIT NOTE  Subjective:  Lisa Liu is a 26 y.o. G4P1021 at [redacted]w[redacted]d being seen today for ongoing prenatal care.  She is currently monitored for the following issues for this low-risk pregnancy and has Fibroid uterus; Supervision of other normal pregnancy, antepartum; Ovarian cyst during pregnancy in first trimester; and Insufficient weight gain in pregnancy, second trimester on their problem list.  Patient reports fatigue.  Contractions: Not present. Vag. Bleeding: None.  Movement: Present. Denies leaking of fluid.   The following portions of the patient's history were reviewed and updated as appropriate: allergies, current medications, past family history, past medical history, past social history, past surgical history and problem list.   Objective:   Vitals:   01/28/22 0932  BP: 103/70  Pulse: (!) 102  Weight: 166 lb 3.2 oz (75.4 kg)    Fetal Status: Fetal Heart Rate (bpm): 134 Fundal Height: 27 cm Movement: Present     General:  Alert, oriented and cooperative. Patient is in no acute distress.  Skin: Skin is warm and dry. No rash noted.   Cardiovascular: Normal heart rate noted  Respiratory: Normal respiratory effort, no problems with respiration noted  Abdomen: Soft, gravid, appropriate for gestational age.  Pain/Pressure: Absent     Pelvic: Cervical exam deferred        Extremities: Normal range of motion.  Edema: None  Mental Status: Normal mood and affect. Normal behavior. Normal judgment and thought content.   Assessment and Plan:  Pregnancy: G4P1021 at [redacted]w[redacted]d 1. [redacted] weeks gestation of pregnancy - Reviewed Nml 28 week labs  2. Insufficient weight gain in pregnancy, second trimester - Reports good appetite and eating well. Nml EFW.  - Encouraged to eat and calorie and nutrient dense foods.  - F/U growth [place].   3. Supervision of other normal pregnancy, antepartum - Routine care  4. Intramural and subserous leiomyoma of uterus - F?U growth [place]  8/7  Preterm labor symptoms and general obstetric precautions including but not limited to vaginal bleeding, contractions, leaking of fluid and fetal movement were reviewed in detail with the patient. Please refer to After Visit Summary for other counseling recommendations.    Centering Pregnancy, Session#5: Reviewed resources in [employer].   Facilitated discussion today:  Stages of Labor, Sign of labor, labor positions, coping strategies.  Reviewed deep relaxation breathing and coping techniques for labor. Discussed choosing a Pediatrician before labor. List given.     Fundal height and FHR appropriate today unless noted otherwise in plan. Patient to continue group care.   No follow-ups on file.  Future Appointments  Date Time Provider Department Center  02/11/2022  9:00 AM CENTERING PROVIDER [facility]  02/25/2022  9:00 AM CENTERING PROVIDER [facility]  03/10/2022 10:30 AM WMC-MFC NURSE WMC-MFC [facility]  03/10/2022 10:45 AM WMC-MFC US5 WMC-MFCUS [facility]  03/11/2022  9:00 AM CENTERING PROVIDER WMC-CWH [facility]  03/25/2022  9:00 AM CENTERING PROVIDER [facility]  04/08/2022  9:00 AM CENTERING PROVIDER [facility]  04/17/2022  8:35 AM Marylene Land, CNM [facility]  04/24/2022  1:35 PM Marylene Land, CNM [facility]  04/29/2022  1:15 PM Marny Lowenstein, PA-C [facility]    Dorathy Kinsman, CNM

## 2022-01-31 ENCOUNTER — Telehealth: Payer: Medicaid Other

## 2022-01-31 ENCOUNTER — Inpatient Hospital Stay (HOSPITAL_COMMUNITY)
Admission: AD | Admit: 2022-01-31 | Discharge: 2022-01-31 | Disposition: A | Discharge status: Home / Self Care | Payer: Medicaid Other | Attending: Family Medicine | Admitting: Family Medicine

## 2022-01-31 ENCOUNTER — Encounter (HOSPITAL_COMMUNITY): Payer: Self-pay | Admitting: Family Medicine

## 2022-01-31 DIAGNOSIS — R102 Pelvic and perineal pain: Secondary | ICD-10-CM | POA: Diagnosis not present

## 2022-01-31 DIAGNOSIS — O4700 False labor before 37 completed weeks of gestation, unspecified trimester: Secondary | ICD-10-CM

## 2022-01-31 DIAGNOSIS — Z79899 Other long term (current) drug therapy: Secondary | ICD-10-CM | POA: Diagnosis not present

## 2022-01-31 DIAGNOSIS — Z3689 Encounter for other specified antenatal screening: Secondary | ICD-10-CM | POA: Insufficient documentation

## 2022-01-31 DIAGNOSIS — Z3A28 28 weeks gestation of pregnancy: Secondary | ICD-10-CM | POA: Diagnosis not present

## 2022-01-31 DIAGNOSIS — O26893 Other specified pregnancy related conditions, third trimester: Secondary | ICD-10-CM | POA: Insufficient documentation

## 2022-01-31 DIAGNOSIS — O212 Late vomiting of pregnancy: Secondary | ICD-10-CM | POA: Diagnosis not present

## 2022-01-31 DIAGNOSIS — O219 Vomiting of pregnancy, unspecified: Secondary | ICD-10-CM

## 2022-01-31 HISTORY — DX: Anemia, unspecified: D64.9

## 2022-01-31 LAB — URINALYSIS, ROUTINE W REFLEX MICROSCOPIC
Glucose, UA: 150 mg/dL — AB
Hgb urine dipstick: NEGATIVE
Ketones, ur: NEGATIVE mg/dL
Leukocytes,Ua: NEGATIVE
Nitrite: NEGATIVE
Protein, ur: 100 mg/dL — AB
Specific Gravity, Urine: 1.032 — ABNORMAL HIGH (ref 1.005–1.030)
pH: 8 (ref 5.0–8.0)

## 2022-01-31 LAB — WET PREP, GENITAL
Clue Cells Wet Prep HPF POC: NONE SEEN
Sperm: NONE SEEN
Trich, Wet Prep: NONE SEEN
WBC, Wet Prep HPF POC: >=10 — AB (ref <10)
Yeast Wet Prep HPF POC: NONE SEEN

## 2022-01-31 MED ORDER — LACTATED RINGERS IV BOLUS
1000.0000 mL | Freq: Once | INTRAVENOUS | Status: AC
Start: 2022-01-31 — End: 2022-01-31
  Administered 2022-01-31: 1000 mL via INTRAVENOUS

## 2022-01-31 MED ORDER — TERBUTALINE SULFATE 1 MG/ML IJ SOLN
0.2500 mg | Freq: Once | INTRAMUSCULAR | Status: AC
  Administered 2022-01-31: 0.25 mg via SUBCUTANEOUS
  Filled 2022-01-31: qty 1

## 2022-01-31 MED ORDER — PROMETHAZINE HCL 25 MG PO TABS: 25.0000 mg | ORAL_TABLET | Freq: Four times a day (QID) | ORAL | 0 refills | Status: DC | PRN

## 2022-01-31 MED ORDER — ACETAMINOPHEN 325 MG PO TABS
650.0000 mg | ORAL_TABLET | Freq: Four times a day (QID) | ORAL | Status: DC | PRN
  Administered 2022-01-31: 650 mg via ORAL
  Filled 2022-01-31: qty 2

## 2022-01-31 MED ORDER — LACTATED RINGERS IV BOLUS
1000.0000 mL | Freq: Once | INTRAVENOUS | Status: AC
  Administered 2022-01-31: 1000 mL via INTRAVENOUS

## 2022-01-31 NOTE — MAU Provider Note (Signed)
History     [id number]  Arrival date and time: 01/31/22 1319    Chief Complaint  Patient presents with   Pelvic Pain     HPI Lisa Liu is a 26 y.o. at 56w4dby 6wk UKoreawho presents for lower pelvic pain.  Pain started last night and has continued today.  Pain is constant, rates her pain 7/10.  Tried tylenol last night with minimal improvement.  Denies vaginal bleeding or LOF, +FM.  Denies headache, blurry vision.    Pregnancy complicated by: -Nausea/vomiting in pregnancy: This has been an ongoing issue.  Able to keep most of her fluids down, but not diet.  States she vomits after every meal.  Currently on Zofran, no improvement with Reglan.   Weight loss noted during pregnancy  -h/o Gestational HTN   OB History     Gravida  4   Para  1   Term  1   Preterm      AB  2   Living  1      SAB  2   IAB      Ectopic      Multiple  0   Live Births  1           Past Medical History:  Diagnosis Date   Anemia    Fibroid    Fibroids    Gestational hypertension 05/12/2017   Insufficient weight gain during pregnancy 04/02/2017   Pregnancy induced hypertension     History reviewed. No pertinent surgical history.  Family History  Problem Relation Age of Onset   Healthy Mother    Varicose Veins Father    Diabetes Paternal Grandmother    Hyperlipidemia Paternal Grandmother     No Known Allergies  No current facility-administered medications on file prior to encounter.   Current Outpatient Medications on File Prior to Encounter  Medication Sig Dispense Refill   acetaminophen (TYLENOL) 325 MG tablet Take 325 mg by mouth every 6 (six) hours as needed.     aspirin EC 81 MG tablet Take 1 tablet (81 mg total) by mouth daily. Swallow whole. Start taking on 10/14/21. 30 tablet 11   famotidine (PEPCID) 20 MG tablet Take 1 tablet (20 mg total) by mouth 2 (two) times daily. 60 tablet 6   ferrous sulfate 325 (65 FE) MG tablet Take 1 tablet (325 mg  total) by mouth every other day. 30 tablet 2   fluticasone (FLONASE) 50 MCG/ACT nasal spray Place 2 sprays into both nostrils daily. 11.1 mL 2   ondansetron (ZOFRAN) 8 MG tablet Take 1 tablet (8 mg total) by mouth every 8 (eight) hours as needed for nausea or vomiting. 30 tablet 6   Prenatal Vit-Fe Fumarate-FA (PRENATAL COMPLETE) 14-0.4 MG TABS Take 1 tablet by mouth daily. 60 tablet 0   Blood Pressure Monitoring DEVI 1 each by Does not apply route once a week. 1 each 0   metoCLOPramide (REGLAN) 10 MG tablet Take 1 tablet (10 mg total) by mouth every 8 (eight) hours as needed for nausea. (Patient not taking: Reported on 12/17/2021) 30 tablet 2   Misc. Devices (GOJJI WEIGHT SCALE) MISC 1 each by Does not apply route once a week. 1 each 0     Review of Systems  Constitutional:  Positive for weight loss. Negative for chills and fever.  HENT: Negative.    Eyes: Negative.   Respiratory: Negative.  Negative for cough and shortness of breath.   Cardiovascular: Negative.  Negative for  chest pain and palpitations.  Gastrointestinal:  Positive for abdominal pain, nausea and vomiting.  Genitourinary: Negative.   Musculoskeletal: Negative.   Skin: Negative.   Neurological: Negative.  Negative for dizziness and headaches.  Endo/Heme/Allergies: Negative.   Psychiatric/Behavioral: Negative.     Pertinent positives and negative per HPI, all others reviewed and negative  Physical Exam   BP 110/69   Pulse 92   Temp 98.5 F (36.9 C) (Oral)   Resp 16   Ht '5\' 5"'$  (1.651 m)   Wt 75.8 kg   LMP  (LMP Unknown)   SpO2 97%   BMI 27.79 kg/m   Patient Vitals for the past 24 hrs:  BP Temp Temp src Pulse Resp SpO2 Height Weight  01/31/22 1741 110/69 -- -- 92 -- -- -- --  01/31/22 1645 116/66 -- -- 94 -- 97 % -- --  01/31/22 1336 -- -- -- -- -- -- '5\' 5"'$  (1.651 m) 75.8 kg  01/31/22 1332 111/69 98.5 F (36.9 C) Oral 98 16 -- -- --    Physical Exam Constitutional:      Appearance: Normal appearance.   Cardiovascular:     Rate and Rhythm: Normal rate and regular rhythm.  Pulmonary:     Effort: No respiratory distress.     Breath sounds: No wheezing or rales.  Abdominal:     Comments: Gravid, soft and non-tender, no rebound, no guarding  Genitourinary:    Comments: Normal external genitalia, pink vaginal mucosa, minimal white discharge, cervix visualized closed. On SVE: closed/long/high Skin:    General: Skin is warm and dry.  Neurological:     Mental Status: She is alert and oriented to person, place, and time.  Psychiatric:        Mood and Affect: Mood normal.        Behavior: Behavior normal.      Cervical Exam Dilation: Closed Effacement (%): Thick Station: Ballotable Exam by:: [employer] Baseline 140, moderate variability, + accels- 15x15 present x 2, no decels Toco: irregular contractions/uterine irritability Cat: I  Labs Results for orders placed or performed during the hospital encounter of 01/31/22 (from the past 24 hour(s))  Urinalysis, Routine w reflex microscopic Urine, Clean Catch     Status: Abnormal   Collection Time: 01/31/22  1:56 PM  Result Value Ref Range   Color, Urine AMBER (A) YELLOW   APPearance CLEAR CLEAR   Specific Gravity, Urine 1.032 (H) 1.005 - 1.030   pH 8.0 5.0 - 8.0   Glucose, UA 150 (A) NEGATIVE mg/dL   Hgb urine dipstick NEGATIVE NEGATIVE   Bilirubin Urine SMALL (A) NEGATIVE   Ketones, ur NEGATIVE NEGATIVE mg/dL   Protein, ur 100 (A) NEGATIVE mg/dL   Nitrite NEGATIVE NEGATIVE   Leukocytes,Ua NEGATIVE NEGATIVE   RBC / HPF 0-5 0 - 5 RBC/hpf   WBC, UA 0-5 0 - 5 WBC/hpf   Bacteria, UA RARE (A) NONE SEEN   Squamous Epithelial / LPF 0-5 0 - 5   Mucus PRESENT   Wet prep, genital     Status: Abnormal   Collection Time: 01/31/22  2:27 PM   Specimen: Vaginal  Result Value Ref Range   Yeast Wet Prep HPF POC NONE SEEN NONE SEEN   Trich, Wet Prep NONE SEEN NONE SEEN   Clue Cells Wet Prep HPF POC NONE SEEN NONE SEEN   WBC, Wet Prep HPF  POC >=10 (A) <10   Sperm NONE SEEN     Imaging No results found.  MAU Course  Procedures Lab Orders         Wet prep, genital         Urinalysis, Routine w reflex microscopic Urine, Clean Catch     Meds ordered this encounter  Medications   lactated ringers bolus 1,000 mL   lactated ringers bolus 1,000 mL   acetaminophen (TYLENOL) tablet 650 mg   terbutaline (BRETHINE) injection 0.25 mg   promethazine (PHENERGAN) 25 MG tablet    Sig: Take 1 tablet (25 mg total) by mouth every 6 (six) hours as needed for nausea or vomiting.    Dispense:  30 tablet    Refill:  0      MDM Preterm contractions, pelvic pain Treated with IV fluids and tylenol Initially contractions appeared more regular q 5-56mn, given IM Terbutaline Contractions spaced and pt noted improvement of discomfort Repeat SVE: closed/long/high  Assessment and Plan   1. Preterm contractions   2. Nausea and vomiting in pregnancy   -no cervical change noted -suspect contractions due to difficulty with hydration -Rx for phenergan sent in, continue with zofran -follow up as scheduled    JAnnalee Genta DO 01/31/22 5:52 PM

## 2022-01-31 NOTE — MAU Note (Signed)
Lisa Liu is a 26 y.o. at 27w4dhere in MAU reporting: started last night, pressure and pulling sensation, constant.  No bleeding or leaking. Reports +FM.  Denies GI or urinary problems  Onset of complaint: started last night Pain score: 8 Vitals:   01/31/22 1332  BP: 111/69  Pulse: 98  Resp: 16  Temp: 98.5 F (36.9 C)     FHT:155 Lab orders placed from triage:  urine

## 2022-02-01 ENCOUNTER — Inpatient Hospital Stay (HOSPITAL_COMMUNITY)
Admission: AD | Admit: 2022-02-01 | Discharge: 2022-02-01 | Disposition: A | Discharge status: Home / Self Care | Payer: Medicaid Other | Attending: Obstetrics and Gynecology | Admitting: Obstetrics and Gynecology

## 2022-02-01 ENCOUNTER — Encounter (HOSPITAL_COMMUNITY): Payer: Self-pay | Admitting: Obstetrics and Gynecology

## 2022-02-01 DIAGNOSIS — O09293 Supervision of pregnancy with other poor reproductive or obstetric history, third trimester: Secondary | ICD-10-CM | POA: Insufficient documentation

## 2022-02-01 DIAGNOSIS — Z3A28 28 weeks gestation of pregnancy: Secondary | ICD-10-CM | POA: Insufficient documentation

## 2022-02-01 DIAGNOSIS — O4703 False labor before 37 completed weeks of gestation, third trimester: Secondary | ICD-10-CM | POA: Diagnosis not present

## 2022-02-01 DIAGNOSIS — Z419 Encounter for procedure for purposes other than remedying health state, unspecified: Secondary | ICD-10-CM | POA: Diagnosis not present

## 2022-02-01 DIAGNOSIS — Z3480 Encounter for supervision of other normal pregnancy, unspecified trimester: Secondary | ICD-10-CM

## 2022-02-01 DIAGNOSIS — O4700 False labor before 37 completed weeks of gestation, unspecified trimester: Secondary | ICD-10-CM | POA: Diagnosis not present

## 2022-02-01 DIAGNOSIS — Z3689 Encounter for other specified antenatal screening: Secondary | ICD-10-CM

## 2022-02-01 LAB — URINALYSIS, ROUTINE W REFLEX MICROSCOPIC
Bacteria, UA: NONE SEEN
Bilirubin Urine: NEGATIVE
Glucose, UA: 50 mg/dL — AB
Hgb urine dipstick: NEGATIVE
Ketones, ur: NEGATIVE mg/dL
Leukocytes,Ua: NEGATIVE
Nitrite: NEGATIVE
Protein, ur: 30 mg/dL — AB
Specific Gravity, Urine: 1.015 (ref 1.005–1.030)
pH: 9 — ABNORMAL HIGH (ref 5.0–8.0)

## 2022-02-01 MED ORDER — CYCLOBENZAPRINE HCL 10 MG PO TABS: 10.0000 mg | ORAL_TABLET | Freq: Two times a day (BID) | ORAL | 0 refills | Status: DC | PRN

## 2022-02-01 MED ORDER — PROMETHAZINE HCL 25 MG PO TABS
25.0000 mg | ORAL_TABLET | Freq: Once | ORAL | Status: AC
Start: 2022-02-01 — End: 2022-02-01
  Administered 2022-02-01: 25 mg via ORAL
  Filled 2022-02-01: qty 1

## 2022-02-01 MED ORDER — ACETAMINOPHEN 500 MG PO TABS
1000.0000 mg | ORAL_TABLET | Freq: Once | ORAL | Status: AC
  Administered 2022-02-01: 1000 mg via ORAL
  Filled 2022-02-01: qty 2

## 2022-02-01 MED ORDER — CYCLOBENZAPRINE HCL 5 MG PO TABS
10.0000 mg | ORAL_TABLET | Freq: Once | ORAL | Status: AC
  Administered 2022-02-01: 10 mg via ORAL
  Filled 2022-02-01: qty 2

## 2022-02-01 NOTE — MAU Note (Signed)
Pt says feels UC's  Was  here Friday afternoon for UC's  Ve - closed Last sex- 2 weeks ago

## 2022-02-01 NOTE — MAU Provider Note (Signed)
History     CSN: [id number]  Arrival date and time: 02/01/22 0445   Event Date/Time   First Provider Initiated Contact with Patient 02/01/22 [phone]      Chief Complaint  Patient presents with   Contractions   Lisa Liu is a 26 y.o. G4P1021 at 2w5dwho [facility]  She presents today for Contractions.  She states the contractions have worsen since she was seen yesterday and evaluated.  She reports the contractions are located in her lower abdominal area and lasts  "a few seconds."  She states she took tylenol yesterday around 5pm with no relief of symptoms. She rates the pain a 8/10 when it comes and describes it as a cramping or tightening sensation.  She denies aggravating or relieving factors.  She endorses fetal movement and denies vaginal bleeding or LOF. She denies recent sexual activity.     OB History     Gravida  4   Para  1   Term  1   Preterm      AB  2   Living  1      SAB  2   IAB      Ectopic      Multiple  0   Live Births  1           Past Medical History:  Diagnosis Date   Anemia    Fibroid    Fibroids    Gestational hypertension 05/12/2017   Insufficient weight gain during pregnancy 04/02/2017   Pregnancy induced hypertension     History reviewed. No pertinent surgical history.  Family History  Problem Relation Age of Onset   Healthy Mother    Varicose Veins Father    Diabetes Paternal Grandmother    Hyperlipidemia Paternal Grandmother     Social History   Tobacco Use   Smoking status: Never   Smokeless tobacco: Never  Vaping Use   Vaping Use: Never used  Substance Use Topics   Alcohol use: No   Drug use: No    Allergies: No Known Allergies  Medications Prior to Admission  Medication Sig Dispense Refill Last Dose   aspirin EC 81 MG tablet Take 1 tablet (81 mg total) by mouth daily. Swallow whole. Start taking on 10/14/21. 30 tablet 11 01/31/2022   famotidine (PEPCID) 20 MG tablet Take 1  tablet (20 mg total) by mouth 2 (two) times daily. 60 tablet 6 Past Week   ferrous sulfate 325 (65 FE) MG tablet Take 1 tablet (325 mg total) by mouth every other day. 30 tablet 2 01/31/2022   ondansetron (ZOFRAN) 8 MG tablet Take 1 tablet (8 mg total) by mouth every 8 (eight) hours as needed for nausea or vomiting. 30 tablet 6 Past Week   Prenatal Vit-Fe Fumarate-FA (PRENATAL COMPLETE) 14-0.4 MG TABS Take 1 tablet by mouth daily. 60 tablet 0 01/31/2022   acetaminophen (TYLENOL) 325 MG tablet Take 325 mg by mouth every 6 (six) hours as needed.      Blood Pressure Monitoring DEVI 1 each by Does not apply route once a week. 1 each 0    fluticasone (FLONASE) 50 MCG/ACT nasal spray Place 2 sprays into both nostrils daily. 11.1 mL 2 More than a month   Misc. Devices (GOJJI WEIGHT SCALE) MISC 1 each by Does not apply route once a week. 1 each 0    promethazine (PHENERGAN) 25 MG tablet Take 1 tablet (25 mg total) by mouth every 6 (six) hours  as needed for nausea or vomiting. 30 tablet 0 none    Review of Systems  Gastrointestinal:  Positive for abdominal pain (Cramping). Negative for constipation, diarrhea, nausea and vomiting.  Genitourinary:  Negative for difficulty urinating, dysuria, vaginal bleeding and vaginal discharge.  Neurological:  Negative for dizziness, light-headedness and headaches.   Physical Exam   Blood pressure 108/74, pulse (!) 101, temperature 97.9 F (36.6 C), temperature source Oral, resp. rate 18, height '5\' 5"'$  (1.651 m), weight 77.6 kg, unknown if currently breastfeeding.  Physical Exam Exam conducted with a chaperone present.  Constitutional:      General: She is not in acute distress.    Appearance: Normal appearance.  HENT:     Head: Normocephalic and atraumatic.  Eyes:     Conjunctiva/sclera: Conjunctivae normal.  Cardiovascular:     Rate and Rhythm: Normal rate.  Pulmonary:     Effort: Pulmonary effort is normal. No respiratory distress.  Abdominal:      Tenderness: There is no abdominal tenderness.     Comments: Gravid, Appears LGA  Genitourinary:    Comments: Dilation: Closed Effacement (%): Thick Exam by:: Gavin Pound, CNM.  Musculoskeletal:     Cervical back: Normal range of motion.  Skin:    General: Skin is warm and dry.  Neurological:     Mental Status: She is alert and oriented to person, place, and time.  Psychiatric:        Mood and Affect: Mood normal.        Behavior: Behavior normal.     Fetal Assessment 150 bpm, Mod Var, -Decels, +Accels Toco: Q2-79mn  MAU Course   Results for orders placed or performed during the hospital encounter of 01/31/22 (from the past 24 hour(s))  Urinalysis, Routine w reflex microscopic Urine, Clean Catch     Status: Abnormal   Collection Time: 01/31/22  1:56 PM  Result Value Ref Range   Color, Urine AMBER (A) YELLOW   APPearance CLEAR CLEAR   Specific Gravity, Urine 1.032 (H) 1.005 - 1.030   pH 8.0 5.0 - 8.0   Glucose, UA 150 (A) NEGATIVE mg/dL   Hgb urine dipstick NEGATIVE NEGATIVE   Bilirubin Urine SMALL (A) NEGATIVE   Ketones, ur NEGATIVE NEGATIVE mg/dL   Protein, ur 100 (A) NEGATIVE mg/dL   Nitrite NEGATIVE NEGATIVE   Leukocytes,Ua NEGATIVE NEGATIVE   RBC / HPF 0-5 0 - 5 RBC/hpf   WBC, UA 0-5 0 - 5 WBC/hpf   Bacteria, UA RARE (A) NONE SEEN   Squamous Epithelial / LPF 0-5 0 - 5   Mucus PRESENT   Wet prep, genital     Status: Abnormal   Collection Time: 01/31/22  2:27 PM   Specimen: Vaginal  Result Value Ref Range   Yeast Wet Prep HPF POC NONE SEEN NONE SEEN   Trich, Wet Prep NONE SEEN NONE SEEN   Clue Cells Wet Prep HPF POC NONE SEEN NONE SEEN   WBC, Wet Prep HPF POC >=10 (A) <10   Sperm NONE SEEN    No results found.  MDM PE Labs: None EFM Assessment and Plan  27year old G4P1021  SIUP at 28.5weeks Cat I FT Preterm Contractions  -POC Reviewed -Exam performed and findings discussed. -Patient offered and accepts pain medication. -Flexeril and tylenol  ordered for pain.  -Phenergan given for anticipated nausea. -Provider oral hydration -Heating pad as appropriate.  -Questions regarding preterm contractions and when to report to hospital addressed. -NST reassuring, but not consistent.  Maryann Conners MSN, CNM 02/01/2022, 6:33 AM   Reassessment (7:25 AM)  -Patient reports some improvement in pain especially with application of heat. -Rates pain a 5/10. -Reviewed usage of heat and flexeril at home as needed.  -Informed on how to make and when to use heating pad at home.  -Will continue to monitor for improvement. -Rx for flexeril to be sent to pharmacy on file.   Reassessment (7:54 AM)  -NST reactive for GA. -Encouraged to call primary office or return to MAU if symptoms worsen or with the onset of new symptoms. -Discharged to home in improved condition.  Maryann Conners MSN, CNM Advanced Practice Provider, Center for [employer]

## 2022-02-03 LAB — GC/CHLAMYDIA PROBE AMP (~~LOC~~) NOT AT ARMC
Chlamydia: NEGATIVE
Comment: NEGATIVE
Comment: NORMAL
Neisseria Gonorrhea: NEGATIVE

## 2022-02-05 ENCOUNTER — Encounter: Payer: Self-pay | Admitting: General Practice

## 2022-02-10 ENCOUNTER — Encounter: Payer: Self-pay | Admitting: *Deleted

## 2022-02-10 NOTE — Progress Notes (Unsigned)
   PRENATAL VISIT NOTE  Subjective:  Lisa Liu is a 26 y.o. G4P1021 at 82w0dbeing seen today for ongoing prenatal care.  She is currently monitored for the following issues for this {Blank single:19197::"high-risk","low-risk"} pregnancy and has Fibroid uterus; Supervision of other normal pregnancy, antepartum; Ovarian cyst during pregnancy in first trimester; and Insufficient weight gain in pregnancy, second trimester on their problem list.  Patient reports {sx:14538}.   .  .   . Denies leaking of fluid.   The following portions of the patient's history were reviewed and updated as appropriate: allergies, current medications, past family history, past medical history, past social history, past surgical history and problem list.   Objective:  There were no vitals filed for this visit.  Fetal Status:           General:  Alert, oriented and cooperative. Patient is in no acute distress.  Skin: Skin is warm and dry. No rash noted.   Cardiovascular: Normal heart rate noted  Respiratory: Normal respiratory effort, no problems with respiration noted  Abdomen: Soft, gravid, appropriate for gestational age.        Pelvic: {Blank single:19197::"Cervical exam performed in the presence of a chaperone","Cervical exam deferred"}        Extremities: Normal range of motion.     Mental Status: Normal mood and affect. Normal behavior. Normal judgment and thought content.   Assessment and Plan:  Pregnancy: G4P1021 at [redacted]w[redacted]d. Ovarian cyst during pregnancy in first trimester ***  2. Intramural and subserous leiomyoma of uterus ***  3. Insufficient weight gain in pregnancy, second trimester ***  4. Supervision of other normal pregnancy, antepartum ***   Centering Pregnancy, Session#6: Reviewed resources in [employer] Facilitated discussion today: preparing for birth and immediate postpartum period. Safe sleep and car seat safety.    Mindfulness activity with positive  affirmations   Fundal height and FHR appropriate today unless noted otherwise in plan. Patient to continue group care.   Preterm labor symptoms and general obstetric precautions including but not limited to vaginal bleeding, contractions, leaking of fluid and fetal movement were reviewed in detail with the patient. Please refer to After Visit Summary for other counseling recommendations.   {Blank single:19197::"Term","Preterm"} labor symptoms and general obstetric precautions including but not limited to vaginal bleeding, contractions, leaking of fluid and fetal movement were reviewed in detail with the patient. Please refer to After Visit Summary for other counseling recommendations.   No follow-ups on file.  Future Appointments  Date Time Provider DeBreckenridge7/06/2022  9:00 AM CENTERING PROVIDER [facility] CenterMS[facility]7/25/2023  9:00 AM CENTERING PROVIDER WMSt. [facility] CenterMDig[facility]8/02/2022 10:30 AM WMC-MFC NURSE WMC-MFC WMDigestive [facility]8/02/2022 10:45 AM WMC-MFC US5 WMC-MFCUS [facility]8/03/2022  9:00 AM CENTERING PROVIDER WMC-CWH WMSur[facility]8/22/2023  9:00 AM CENTERING PROVIDER WMMulticare Health SystemMBelle[facility]9/12/2021  9:00 AM CENTERING PROVIDER WMSt Francis Memorial HospitalMDana[facility]9/14/2023  8:35 AM KoStarr LakeCNM WMPam [facility]ty [facility] HammondMB[facility]9/21/2023  1:35 PM KoStarr LakeCNM WMBanner Health Mountain Vista Surgery CenterMPhysicians [facility]9/26/2023  1:15 PM WeLuvenia ReddenPA-C WMBarn[facility] Surgery CenterMGlenwoodCNM

## 2022-02-11 ENCOUNTER — Ambulatory Visit (INDEPENDENT_AMBULATORY_CARE_PROVIDER_SITE_OTHER): Payer: Medicaid Other | Admitting: Advanced Practice Midwife

## 2022-02-11 VITALS — BP 115/65 | HR 102 | Wt 167.4 lb

## 2022-02-11 DIAGNOSIS — N83209 Unspecified ovarian cyst, unspecified side: Secondary | ICD-10-CM

## 2022-02-11 DIAGNOSIS — D252 Subserosal leiomyoma of uterus: Secondary | ICD-10-CM

## 2022-02-11 DIAGNOSIS — Z3A30 30 weeks gestation of pregnancy: Secondary | ICD-10-CM

## 2022-02-11 DIAGNOSIS — O2612 Low weight gain in pregnancy, second trimester: Secondary | ICD-10-CM

## 2022-02-11 DIAGNOSIS — Z3480 Encounter for supervision of other normal pregnancy, unspecified trimester: Secondary | ICD-10-CM

## 2022-02-11 DIAGNOSIS — D251 Intramural leiomyoma of uterus: Secondary | ICD-10-CM

## 2022-02-11 DIAGNOSIS — O3481 Maternal care for other abnormalities of pelvic organs, first trimester: Secondary | ICD-10-CM

## 2022-02-11 NOTE — Patient Instructions (Signed)
Deciding about Circumcision in Baby Boys  (The Basics)  What is circumcision?   Circumcision is a surgery that removes the skin that covers the tip of the penis, called the "foreskin" Circumcision is usually done when a boy is between 1 and 10 days old. In the [place], circumcision is common. In some other countries, fewer boys are circumcised. Circumcision is a common tradition in some religions.  Should I have my baby boy circumcised?   There is no easy answer. Circumcision has some benefits. But it also has risks. After talking with your doctor, you will have to decide for yourself what is right for your family.  What are the benefits of circumcision?   Circumcised boys seem to have slightly lower rates of: ?Urinary tract infections ?Swelling of the opening at the tip of the penis Circumcised men seem to have slightly lower rates of: ?Urinary tract infections ?Swelling of the opening at the tip of the penis ?Penis cancer ?HIV and other infections that you catch during sex ?Cervical cancer in the women they have sex with Even so, in the [place], the risks of these problems are small - even in boys and men who have not been circumcised. Plus, boys and men who are not circumcised can reduce these extra risks by: ?Cleaning their penis well ?Using condoms during sex  What are the risks of circumcision?  Risks include: ?Bleeding or infection from the surgery ?Damage to or amputation of the penis ?A chance that the doctor will cut off too much or not enough of the foreskin ?A chance that sex won't feel as good later in life Only about 1 out of every 200 circumcisions leads to problems. There is also a chance that your health insurance won't pay for circumcision.  How is circumcision done in baby boys?  First, the baby gets medicine for pain relief. This might be a cream on the skin or a shot into the base of the penis. Next, the doctor cleans the baby's penis well. Then  he or she uses special tools to cut off the foreskin. Finally, the doctor wraps a bandage (called gauze) around the baby's penis. If you have your baby circumcised, his doctor or nurse will give you instructions on how to care for him after the surgery. It is important that you follow those instructions carefully.  

## 2022-02-24 NOTE — Progress Notes (Unsigned)
   PRENATAL VISIT NOTE  Subjective:  Lisa Liu is a 26 y.o. G4P1021 at 14w1dbeing seen today for ongoing prenatal care.  She is currently monitored for the following issues for this low-risk pregnancy and has Fibroid uterus; Supervision of other normal pregnancy, antepartum; Ovarian cyst during pregnancy in first trimester; Insufficient weight gain in pregnancy, second trimester; and History of gestational hypertension on their problem list.  Patient reports fatigue, good appetite, heartburn not controlled with Pepcid and milk. Eating well.  Contractions: Not present. Vag. Bleeding: None.  Movement: Present. Denies leaking of fluid.   The following portions of the patient's history were reviewed and updated as appropriate: allergies, current medications, past family history, past medical history, past social history, past surgical history and problem list.   Objective:   Vitals:   02/25/22 0921  BP: 114/75  Pulse: 93  Weight: 168 lb 12.8 oz (76.6 kg)    Fetal Status: Fetal Heart Rate (bpm): 134 Fundal Height: 33 cm Movement: Present     General:  Alert, oriented and cooperative. Patient is in no acute distress.  Skin: Skin is warm and dry. No rash noted.   Cardiovascular: Normal heart rate noted  Respiratory: Normal respiratory effort, no problems with respiration noted  Abdomen: Soft, gravid, appropriate for gestational age.  Pain/Pressure: Absent     Pelvic: Cervical exam deferred        Extremities: Normal range of motion.  Edema: None  Mental Status: Normal mood and affect. Normal behavior. Normal judgment and thought content.   Assessment and Plan:  Pregnancy: G4P1021 at [redacted]w[redacted]d. Ovarian cyst during pregnancy in first trimester - F/U [place]7  2. Intramural and subserous leiomyoma of uterus - F/U [place]7  3. Supervision of other normal pregnancy, antepartum - Routine CenteringPregnancy Care - Requests to switch provider to [place] one-on one visits  after Centering finishes. Request sent to front office.   4. Insufficient weight gain in pregnancy, second trimester. Has gained 2 pounds in the past few weeks. Good appetite. Has access to food.  - F/U [place]7 - Encourage nutrient and calorie-dense diet  5. Heartburn Pregnancy - Add Protonix. Continue Pepcid on schedule. Discussed dietary changes for heartburn.   Preterm labor symptoms and general obstetric precautions including but not limited to vaginal bleeding, contractions, leaking of fluid and fetal movement were reviewed in detail with the patient. Please refer to After Visit Summary for other counseling recommendations.    Centering Pregnancy, Session#7: Reviewed resources in [employer]  Facilitated discussion today:  postpartum communication and postpartum mood changes Mindfulness activity with mindful listening  Fundal height and FHR appropriate today unless noted otherwise in plan. Patient to continue group care.    No follow-ups on file.  Future Appointments  Date Time Provider De[place]8/02/2022 10:30 AM WMC-MFC NURSE WMC-MFC WMAtl[facility]are [facility]8/02/2022 10:45 AM WMC-MFC US5 WMC-MFCUS WMLa Jolla [facility] Center8/03/2022  9:00 AM CENTERING PROVIDER WMC-CWH WMM[facility]8/22/2023  9:00 AM CENTERING PROVIDER WMVenture Ambulatory Surgery Center LLCMMer[facility]9/12/2021  9:00 AM CENTERING PROVIDER WMMidstate Medical CenterMPsa [facility]9/14/2023  8:35 AM KoStarr LakeCNM WMWellstar Atlanta Medical CenterMRiver[facility]9/21/2023  1:35 PM KoStarr LakeCNM WMMarin General HospitalMRiverside [facility]9/26/2023  1:15 PM WeLuvenia ReddenPA-C WMRoc Surgery LLCMKaneCNM

## 2022-02-25 ENCOUNTER — Ambulatory Visit (INDEPENDENT_AMBULATORY_CARE_PROVIDER_SITE_OTHER): Payer: Medicaid Other | Admitting: Advanced Practice Midwife

## 2022-02-25 VITALS — BP 114/75 | HR 93 | Wt 168.8 lb

## 2022-02-25 DIAGNOSIS — O2612 Low weight gain in pregnancy, second trimester: Secondary | ICD-10-CM

## 2022-02-25 DIAGNOSIS — N83209 Unspecified ovarian cyst, unspecified side: Secondary | ICD-10-CM

## 2022-02-25 DIAGNOSIS — Z8759 Personal history of other complications of pregnancy, childbirth and the puerperium: Secondary | ICD-10-CM

## 2022-02-25 DIAGNOSIS — Z3A32 32 weeks gestation of pregnancy: Secondary | ICD-10-CM

## 2022-02-25 DIAGNOSIS — Z3480 Encounter for supervision of other normal pregnancy, unspecified trimester: Secondary | ICD-10-CM

## 2022-02-25 DIAGNOSIS — O3481 Maternal care for other abnormalities of pelvic organs, first trimester: Secondary | ICD-10-CM

## 2022-02-25 DIAGNOSIS — D252 Subserosal leiomyoma of uterus: Secondary | ICD-10-CM

## 2022-02-25 DIAGNOSIS — D251 Intramural leiomyoma of uterus: Secondary | ICD-10-CM

## 2022-02-25 DIAGNOSIS — R12 Heartburn: Secondary | ICD-10-CM

## 2022-02-25 DIAGNOSIS — O26893 Other specified pregnancy related conditions, third trimester: Secondary | ICD-10-CM

## 2022-02-25 MED ORDER — PANTOPRAZOLE SODIUM 40 MG PO TBEC: 40.0000 mg | DELAYED_RELEASE_TABLET | Freq: Every day | ORAL | 3 refills | Status: DC

## 2022-02-27 DIAGNOSIS — Z8759 Personal history of other complications of pregnancy, childbirth and the puerperium: Secondary | ICD-10-CM | POA: Insufficient documentation

## 2022-03-04 DIAGNOSIS — Z419 Encounter for procedure for purposes other than remedying health state, unspecified: Secondary | ICD-10-CM | POA: Diagnosis not present

## 2022-03-10 ENCOUNTER — Ambulatory Visit: Payer: Medicaid Other | Attending: Obstetrics | Admitting: *Deleted

## 2022-03-10 ENCOUNTER — Ambulatory Visit: Payer: Medicaid Other

## 2022-03-10 ENCOUNTER — Encounter: Payer: Self-pay | Admitting: *Deleted

## 2022-03-10 ENCOUNTER — Ambulatory Visit (HOSPITAL_BASED_OUTPATIENT_CLINIC_OR_DEPARTMENT_OTHER): Payer: Medicaid Other

## 2022-03-10 VITALS — BP 111/71 | HR 93

## 2022-03-10 DIAGNOSIS — O09293 Supervision of pregnancy with other poor reproductive or obstetric history, third trimester: Secondary | ICD-10-CM | POA: Insufficient documentation

## 2022-03-10 DIAGNOSIS — D259 Leiomyoma of uterus, unspecified: Secondary | ICD-10-CM

## 2022-03-10 DIAGNOSIS — O3413 Maternal care for benign tumor of corpus uteri, third trimester: Secondary | ICD-10-CM

## 2022-03-10 DIAGNOSIS — Z3689 Encounter for other specified antenatal screening: Secondary | ICD-10-CM

## 2022-03-10 DIAGNOSIS — Z3A34 34 weeks gestation of pregnancy: Secondary | ICD-10-CM

## 2022-03-10 DIAGNOSIS — O1493 Unspecified pre-eclampsia, third trimester: Secondary | ICD-10-CM | POA: Insufficient documentation

## 2022-03-11 ENCOUNTER — Ambulatory Visit (INDEPENDENT_AMBULATORY_CARE_PROVIDER_SITE_OTHER): Payer: Medicaid Other | Admitting: Advanced Practice Midwife

## 2022-03-11 VITALS — BP 121/77 | HR 79 | Wt 170.2 lb

## 2022-03-11 DIAGNOSIS — Z3A34 34 weeks gestation of pregnancy: Secondary | ICD-10-CM

## 2022-03-11 DIAGNOSIS — D251 Intramural leiomyoma of uterus: Secondary | ICD-10-CM

## 2022-03-11 DIAGNOSIS — Z8759 Personal history of other complications of pregnancy, childbirth and the puerperium: Secondary | ICD-10-CM

## 2022-03-11 DIAGNOSIS — D252 Subserosal leiomyoma of uterus: Secondary | ICD-10-CM

## 2022-03-11 DIAGNOSIS — Z3480 Encounter for supervision of other normal pregnancy, unspecified trimester: Secondary | ICD-10-CM

## 2022-03-11 DIAGNOSIS — Z3483 Encounter for supervision of other normal pregnancy, third trimester: Secondary | ICD-10-CM

## 2022-03-11 DIAGNOSIS — O2613 Low weight gain in pregnancy, third trimester: Secondary | ICD-10-CM

## 2022-03-11 DIAGNOSIS — O2612 Low weight gain in pregnancy, second trimester: Secondary | ICD-10-CM

## 2022-03-11 NOTE — Progress Notes (Signed)
   PRENATAL VISIT NOTE  Subjective:  Lisa Liu is a 26 y.o. G4P1021 at 35w1dbeing seen today for ongoing prenatal care.  She is currently monitored for the following issues for this low-risk pregnancy and has Fibroid uterus; Supervision of other normal pregnancy, antepartum; Ovarian cyst during pregnancy in first trimester; Insufficient weight gain in pregnancy, second trimester; and History of gestational hypertension on their problem list.  Patient reports no complaints.  Contractions: Not present. Vag. Bleeding: None.  Movement: Present. Denies leaking of fluid.   The following portions of the patient's history were reviewed and updated as appropriate: allergies, current medications, past family history, past medical history, past social history, past surgical history and problem list.   Objective:   Vitals:   03/11/22 0922  BP: 121/77  Pulse: 79  Weight: 170 lb 3.2 oz (77.2 kg)    Fetal Status:     Movement: Present     General:  Alert, oriented and cooperative. Patient is in no acute distress.  Skin: Skin is warm and dry. No rash noted.   Cardiovascular: Normal heart rate noted  Respiratory: Normal respiratory effort, no problems with respiration noted  Abdomen: Soft, gravid, appropriate for gestational age.  Pain/Pressure: Absent     Pelvic: Cervical exam deferred        Extremities: Normal range of motion.  Edema: None  Mental Status: Normal mood and affect. Normal behavior. Normal judgment and thought content.   Assessment and Plan:  Pregnancy: G4P1021 at 367w1d. Intramural and subserous leiomyoma of uterus - Nml growth  Est. FW:    2415  gm      5 lb 5 oz     55  %. No F/u needed  2. Supervision of other normal pregnancy, antepartum - CenteringPregnancy  3. Insufficient weight gain in pregnancy, second trimester - Nml fetal wt. Nml appetite.  4. History of gestational hypertension - Nml BP  5. [redacted] weeks gestation of pregnancy    Centering  Pregnancy, Session#8: Reviewed resources in [employer]  Facilitated discussion today:  Induction of labor, C/S, when to go to the hospital, labor red flags, infant feeding  Fundal height and FHR appropriate today unless noted otherwise in plan. Patient to continue group care.   Preterm labor symptoms and general obstetric precautions including but not limited to vaginal bleeding, contractions, leaking of fluid and fetal movement were reviewed in detail with the patient. Please refer to After Visit Summary for other counseling recommendations.   No follow-ups on file.  Future Appointments  Date Time Provider De[place]8/22/2023  9:00 AM CENTERING PROVIDER WMTristar Sk[facility] CampusMV[facility]9/12/2021  9:00 AM CENTERING PROVIDER WMRiverton HospitalMMont[facility]9/14/2023  8:55 AM SmMinette BrineMProvidence St Vincent Medical CenterMM[facility]9/19/2023  8:15 AM SmManya SilvasCNM WMSharp Mcdonald CenterMChina Lake AcresCNNorth [place]

## 2022-03-24 ENCOUNTER — Encounter: Payer: Self-pay | Admitting: Advanced Practice Midwife

## 2022-03-25 ENCOUNTER — Other Ambulatory Visit (HOSPITAL_COMMUNITY)
Admission: RE | Admit: 2022-03-25 | Discharge: 2022-03-25 | Disposition: A | Discharge status: Home / Self Care | Payer: Medicaid Other | Source: Ambulatory Visit | Attending: Advanced Practice Midwife | Admitting: Advanced Practice Midwife

## 2022-03-25 ENCOUNTER — Ambulatory Visit (INDEPENDENT_AMBULATORY_CARE_PROVIDER_SITE_OTHER): Payer: Medicaid Other | Admitting: Advanced Practice Midwife

## 2022-03-25 VITALS — BP 121/79 | HR 86 | Wt 169.4 lb

## 2022-03-25 DIAGNOSIS — Z8759 Personal history of other complications of pregnancy, childbirth and the puerperium: Secondary | ICD-10-CM

## 2022-03-25 DIAGNOSIS — Z3480 Encounter for supervision of other normal pregnancy, unspecified trimester: Secondary | ICD-10-CM | POA: Diagnosis not present

## 2022-03-25 DIAGNOSIS — R42 Dizziness and giddiness: Secondary | ICD-10-CM

## 2022-03-25 DIAGNOSIS — D252 Subserosal leiomyoma of uterus: Secondary | ICD-10-CM

## 2022-03-25 DIAGNOSIS — Z3A36 36 weeks gestation of pregnancy: Secondary | ICD-10-CM

## 2022-03-25 DIAGNOSIS — D251 Intramural leiomyoma of uterus: Secondary | ICD-10-CM

## 2022-03-25 DIAGNOSIS — O2612 Low weight gain in pregnancy, second trimester: Secondary | ICD-10-CM

## 2022-03-25 LAB — CBC
Hematocrit: 33.6 % — ABNORMAL LOW (ref 34.0–46.6)
Hemoglobin: 10.5 g/dL — ABNORMAL LOW (ref 11.1–15.9)
MCH: 25.4 pg — ABNORMAL LOW (ref 26.6–33.0)
MCHC: 31.3 g/dL — ABNORMAL LOW (ref 31.5–35.7)
MCV: 81 fL (ref 79–97)
Platelets: 306 10*3/uL (ref 150–450)
RBC: 4.13 x10E6/uL (ref 3.77–5.28)
RDW: 18.3 % — ABNORMAL HIGH (ref 11.7–15.4)
WBC: 6.2 10*3/uL (ref 3.4–10.8)

## 2022-03-25 NOTE — Progress Notes (Signed)
   PRENATAL VISIT NOTE  Subjective:  Lisa Liu is a 26 y.o. G4P1021 at 26w1dbeing seen today for ongoing prenatal care.  She is currently monitored for the following issues for this low-risk pregnancy and has Fibroid uterus; Supervision of other normal pregnancy, antepartum; Ovarian cyst during pregnancy in first trimester; Insufficient weight gain in pregnancy, second trimester; and History of gestational hypertension on their problem list.  Patient reports fatigue and dizziness and more frequent UC's  .  Contractions: Irritability. Vag. Bleeding: None.  Movement: Present. Denies leaking of fluid.   The following portions of the patient's history were reviewed and updated as appropriate: allergies, current medications, past family history, past medical history, past social history, past surgical history and problem list.   Objective:   Vitals:   03/25/22 0914  BP: 121/79  Pulse: 86  Weight: 169 lb 6.4 oz (76.8 kg)    Fetal Status: Fetal Heart Rate (bpm): 136 Fundal Height: 36 cm Movement: Present  Presentation: Vertex  General:  Alert, oriented and cooperative. Patient is in no acute distress.  Skin: Skin is warm and dry. No rash noted.   Cardiovascular: Normal heart rate noted  Respiratory: Normal respiratory effort, no problems with respiration noted  Abdomen: Soft, gravid, appropriate for gestational age.  Pain/Pressure: Present     Pelvic: Cervical exam performed in the presence of a chaperone Dilation: 1 Effacement (%): 0 Station: -3  Extremities: Normal range of motion.  Edema: Trace  Mental Status: Normal mood and affect. Normal behavior. Normal judgment and thought content.   Assessment and Plan:  Pregnancy: G4P1021 at 359w1d. [redacted] weeks gestation of pregnancy  - Culture, beta strep (group b only) - GC/Chlamydia probe amp ([place])[facility]  2. Intramural and subserous leiomyoma of uterus - Nml growth  3. History of gestational  hypertension - Nml BP  4. Supervision of other normal pregnancy, antepartum  - Culture, beta strep (group b only) - GC/Chlamydia probe amp ([place])[facility]. Insufficient weight gain in pregnancy, second trimester - Good appetite -Nml fetal growth  6. Dizziness - Increase fluids, snacks - CBC  Term labor symptoms and general obstetric precautions including but not limited to vaginal bleeding, contractions, leaking of fluid and fetal movement were reviewed in detail with the patient. Please refer to After Visit Summary for other counseling recommendations.   Return in about 2 weeks (around 04/08/2022) for ROB.   Centering Pregnancy, Session#9: Reviewed resources in [employer]  Facilitated discussion today:  Mom and baby "When to call" Discussed Soothing techniques for infants, Shaken baby syndrome  Perinatal Mood and Anxiety Disorder Continuum activity. Guest facilitator JaVesta MixerIBWest Florida Hospitalrovider.  "What brings me joy" and " Three wishes" activities.   Fundal height and FHR appropriate today unless noted otherwise in plan. Patient to continue group care.    Future Appointments  Date Time Provider De[place]9/12/2021  9:00 AM CENTERING PROVIDER WMAlexander HospitalMLifecare Hospitals [facility]9/14/2023  8:55 AM SmMinette BrineMUnion Hospital IncMBaylor Scott [facility]9/19/2023  8:15 AM SmManya SilvasCNM WMAllen Parish HospitalMAguas ClarasCNNorth [place]

## 2022-03-26 LAB — GC/CHLAMYDIA PROBE AMP (~~LOC~~) NOT AT ARMC
Chlamydia: NEGATIVE
Comment: NEGATIVE
Comment: NORMAL
Neisseria Gonorrhea: NEGATIVE

## 2022-03-29 LAB — CULTURE, BETA STREP (GROUP B ONLY): Strep Gp B Culture: NEGATIVE

## 2022-03-31 ENCOUNTER — Inpatient Hospital Stay (HOSPITAL_COMMUNITY)
Admission: AD | Admit: 2022-03-31 | Discharge: 2022-03-31 | Disposition: A | Discharge status: Home / Self Care | Payer: Medicaid Other | Attending: Family Medicine | Admitting: Family Medicine

## 2022-03-31 ENCOUNTER — Encounter (HOSPITAL_COMMUNITY): Payer: Self-pay | Admitting: Family Medicine

## 2022-03-31 DIAGNOSIS — O479 False labor, unspecified: Secondary | ICD-10-CM

## 2022-03-31 DIAGNOSIS — O471 False labor at or after 37 completed weeks of gestation: Secondary | ICD-10-CM | POA: Diagnosis not present

## 2022-03-31 DIAGNOSIS — Z3480 Encounter for supervision of other normal pregnancy, unspecified trimester: Secondary | ICD-10-CM

## 2022-03-31 DIAGNOSIS — Z3A37 37 weeks gestation of pregnancy: Secondary | ICD-10-CM | POA: Insufficient documentation

## 2022-03-31 NOTE — MAU Note (Signed)
Lisa Liu is a 26 y.o. at 50w0dhere in MAU reporting: is contracting and feeling pressure.  Getting closer, now every 8-9 min. No bleeding or leaking.  Reports +FM.  Was 1 cm when last checked. Has been having loose stools  Onset of complaint: 3days ago Pain score: 7 Vitals:   03/31/22 1413  BP: 112/73  Pulse: (!) 120  Resp: 16  Temp: 98.1 F (36.7 C)  SpO2: 100%     FHT:+FM, dress on Lab orders placed from triage:

## 2022-03-31 NOTE — Progress Notes (Cosign Needed Addendum)
S: Lisa Liu is a 26 y.o. [phone] at [redacted]w[redacted]d who presents to MAU today for labor evaluation.     Cervical exam by RN:  Dilation: 1 Effacement (%): Thick Station: -2 Presentation: Vertex Exam by:: F. Morris, RNC  Fetal Monitoring: Baseline: 135 bpm Variability: moderate Accelerations: present Decelerations: one variable deceleration Contractions: irritability, irregular  MDM Discussed patient with RN. NST reviewed and d/w Dr ERip Harbour   A: SIUP at 336w0dFalse labor  P: Discharge home Labor precautions and kick counts included in AVS [facility]  Patient may return to MAU as needed or when in labor   PrLenoria ChimeMD 03/31/2022 4:54 PM

## 2022-04-04 ENCOUNTER — Inpatient Hospital Stay (HOSPITAL_COMMUNITY)
Admission: AD | Admit: 2022-04-04 | Discharge: 2022-04-04 | Disposition: A | Discharge status: Home / Self Care | Payer: Medicaid Other | Attending: Obstetrics & Gynecology | Admitting: Obstetrics & Gynecology

## 2022-04-04 DIAGNOSIS — N760 Acute vaginitis: Secondary | ICD-10-CM | POA: Diagnosis not present

## 2022-04-04 DIAGNOSIS — B9689 Other specified bacterial agents as the cause of diseases classified elsewhere: Secondary | ICD-10-CM | POA: Insufficient documentation

## 2022-04-04 DIAGNOSIS — O09293 Supervision of pregnancy with other poor reproductive or obstetric history, third trimester: Secondary | ICD-10-CM | POA: Diagnosis not present

## 2022-04-04 DIAGNOSIS — Z419 Encounter for procedure for purposes other than remedying health state, unspecified: Secondary | ICD-10-CM | POA: Diagnosis not present

## 2022-04-04 DIAGNOSIS — Z3A37 37 weeks gestation of pregnancy: Secondary | ICD-10-CM

## 2022-04-04 DIAGNOSIS — O23593 Infection of other part of genital tract in pregnancy, third trimester: Secondary | ICD-10-CM | POA: Diagnosis present

## 2022-04-04 DIAGNOSIS — Z3480 Encounter for supervision of other normal pregnancy, unspecified trimester: Secondary | ICD-10-CM

## 2022-04-04 LAB — URINALYSIS, ROUTINE W REFLEX MICROSCOPIC
Glucose, UA: 100 mg/dL — AB
Ketones, ur: NEGATIVE mg/dL
Nitrite: POSITIVE — AB
Protein, ur: 100 mg/dL — AB
Specific Gravity, Urine: 1.02 (ref 1.005–1.030)
pH: 8 (ref 5.0–8.0)

## 2022-04-04 LAB — WET PREP, GENITAL
Sperm: NONE SEEN
Trich, Wet Prep: NONE SEEN
WBC, Wet Prep HPF POC: >=10 — AB (ref <10)
Yeast Wet Prep HPF POC: NONE SEEN

## 2022-04-04 LAB — URINALYSIS, MICROSCOPIC (REFLEX)

## 2022-04-04 MED ORDER — METRONIDAZOLE 500 MG PO TABS: 500.0000 mg | ORAL_TABLET | Freq: Two times a day (BID) | ORAL | 0 refills | Status: AC

## 2022-04-04 NOTE — MAU Provider Note (Signed)
History     CSN: [id number]  Arrival date and time: 04/04/22 1049   None     Chief Complaint  Patient presents with  . Vaginal Itching   Lisa Liu is a 26 y.o.p G4P1021  at 5w4dwho presents today with itching and discharge. She denies any contractions, VB or LOF. She reports normal fetal movement.   Vaginal Itching The patient's primary symptoms include genital itching and vaginal discharge. This is a new problem. The current episode started in the past 7 days. The problem occurs constantly. The problem has been unchanged. The patient is experiencing no pain. She is pregnant. The vaginal discharge was watery. There has been no bleeding. Nothing aggravates the symptoms. She has tried nothing for the symptoms.    OB History     Gravida  4   Para  1   Term  1   Preterm      AB  2   Living  1      SAB  2   IAB      Ectopic      Multiple  0   Live Births  1           Past Medical History:  Diagnosis Date  . Anemia   . Fibroid   . Fibroids   . Gestational hypertension 05/12/2017  . Insufficient weight gain during pregnancy 04/02/2017  . Pregnancy induced hypertension     No past surgical history on file.  Family History  Problem Relation Age of Onset  . Healthy Mother   . Varicose Veins Father   . Diabetes Paternal Grandmother   . Hyperlipidemia Paternal Grandmother     Social History   Tobacco Use  . Smoking status: Never  . Smokeless tobacco: Never  Vaping Use  . Vaping Use: Never used  Substance Use Topics  . Alcohol use: No  . Drug use: No    Allergies: No Known Allergies  Medications Prior to Admission  Medication Sig Dispense Refill Last Dose  . acetaminophen (TYLENOL) 325 MG tablet Take 325 mg by mouth every 6 (six) hours as needed.     .Marland Kitchenaspirin EC 81 MG tablet Take 1 tablet (81 mg total) by mouth daily. Swallow whole. Start taking on 10/14/21. (Patient not taking: Reported on 03/25/2022) 30 tablet 11   . Blood  Pressure Monitoring DEVI 1 each by Does not apply route once a week. 1 each 0   . cyclobenzaprine (FLEXERIL) 10 MG tablet Take 1 tablet (10 mg total) by mouth 2 (two) times daily as needed for muscle spasms. (Patient not taking: Reported on 02/11/2022) 20 tablet 0   . famotidine (PEPCID) 20 MG tablet Take 1 tablet (20 mg total) by mouth 2 (two) times daily. 60 tablet 6   . ferrous sulfate 325 (65 FE) MG tablet Take 1 tablet (325 mg total) by mouth every other day. 30 tablet 2   . fluticasone (FLONASE) 50 MCG/ACT nasal spray Place 2 sprays into both nostrils daily. (Patient not taking: Reported on 02/25/2022) 11.1 mL 2   . Misc. Devices (GOJJI WEIGHT SCALE) MISC 1 each by Does not apply route once a week. 1 each 0   . ondansetron (ZOFRAN) 8 MG tablet Take 1 tablet (8 mg total) by mouth every 8 (eight) hours as needed for nausea or vomiting. (Patient not taking: Reported on 03/10/2022) 30 tablet 6   . pantoprazole (PROTONIX) 40 MG tablet Take 1 tablet (40 mg total) by mouth  daily. 30 tablet 3   . Prenatal Vit-Fe Fumarate-FA (PRENATAL COMPLETE) 14-0.4 MG TABS Take 1 tablet by mouth daily. 60 tablet 0   . promethazine (PHENERGAN) 25 MG tablet Take 1 tablet (25 mg total) by mouth every 6 (six) hours as needed for nausea or vomiting. (Patient not taking: Reported on 02/11/2022) 30 tablet 0     Review of Systems  Genitourinary:  Positive for vaginal discharge.  All other systems reviewed and are negative.  Physical Exam   Blood pressure 110/70, pulse 97, temperature 98.3 F (36.8 C), resp. rate 18, height '5\' 5"'$  (1.651 m), weight 78 kg, SpO2 98 %, unknown if currently breastfeeding.  Physical Exam Constitutional:      Appearance: She is well-developed.  HENT:     Head: Normocephalic.  Eyes:     Pupils: Pupils are equal, round, and reactive to light.  Cardiovascular:     Rate and Rhythm: Normal rate and regular rhythm.     Heart sounds: Normal heart sounds.  Pulmonary:     Effort: Pulmonary effort  is normal. No respiratory distress.     Breath sounds: Normal breath sounds.  Abdominal:     Palpations: Abdomen is soft.     Tenderness: There is no abdominal tenderness.  Genitourinary:    Vagina: No bleeding. Vaginal discharge: mucusy.    Comments: External: no lesion Vagina: small amount of white discharge     Musculoskeletal:        General: Normal range of motion.     Cervical back: Normal range of motion and neck supple.  Skin:    General: Skin is warm and dry.  Neurological:     Mental Status: She is alert and oriented to person, place, and time.  Psychiatric:        Mood and Affect: Mood normal.        Behavior: Behavior normal.     +FHT 140 with doppler    Results for orders placed or performed during the hospital encounter of 04/04/22 (from the past 24 hour(s))  Wet prep, genital     Status: Abnormal   Collection Time: 04/04/22 11:36 AM   Specimen: PATH Cytology Cervicovaginal Ancillary Only  Result Value Ref Range   Yeast Wet Prep HPF POC NONE SEEN NONE SEEN   Trich, Wet Prep NONE SEEN NONE SEEN   Clue Cells Wet Prep HPF POC PRESENT (A) NONE SEEN   WBC, Wet Prep HPF POC >=10 (A) <10   Sperm NONE SEEN     MAU Course  Procedures  MDM   Assessment and Plan   1. Supervision of other normal pregnancy, antepartum   2. Bacterial vaginosis   3. [redacted] weeks gestation of pregnancy    DC home in stable condition  Comfort measures reviewed  3rd Trimester precautions  Labor precautions  Fetal kick counts RX: flagyl '500mg'$  BID x 7 days  Return to MAU as needed FU with OB as planned   [facility] for [facility] Follow up.   Specialty: Obstetrics and Gynecology Contact information: [place] [id number] [place] DNP, CNM  04/04/22  12:04 PM

## 2022-04-04 NOTE — MAU Note (Cosign Needed Addendum)
.  Lisa Liu is a 26 y.o. at 52w4dhere in MAU reporting: she has had vaginal itching and irritation for a few days. Reports white to clear mucus discharge. Denies any pain and good fetal movement felt. Reports occasional   ctx.  LMP:  Onset of complaint: 2-3 days Pain score: 0 Vitals:   04/04/22 1126  BP: 110/70  Pulse: 97  Resp: 18  Temp: 98.3 F (36.8 C)  SpO2: 98%     FHT:140  Lab orders placed from triage:   u/a, g/c,wet prep

## 2022-04-08 ENCOUNTER — Other Ambulatory Visit: Payer: Self-pay

## 2022-04-08 ENCOUNTER — Ambulatory Visit (INDEPENDENT_AMBULATORY_CARE_PROVIDER_SITE_OTHER): Payer: Medicaid Other | Admitting: Advanced Practice Midwife

## 2022-04-08 VITALS — BP 128/71 | HR 92 | Wt 170.5 lb

## 2022-04-08 DIAGNOSIS — D251 Intramural leiomyoma of uterus: Secondary | ICD-10-CM | POA: Diagnosis not present

## 2022-04-08 DIAGNOSIS — D252 Subserosal leiomyoma of uterus: Secondary | ICD-10-CM | POA: Diagnosis not present

## 2022-04-08 DIAGNOSIS — Z8759 Personal history of other complications of pregnancy, childbirth and the puerperium: Secondary | ICD-10-CM

## 2022-04-08 DIAGNOSIS — Z23 Encounter for immunization: Secondary | ICD-10-CM

## 2022-04-08 DIAGNOSIS — Z3A38 38 weeks gestation of pregnancy: Secondary | ICD-10-CM

## 2022-04-08 DIAGNOSIS — Z3480 Encounter for supervision of other normal pregnancy, unspecified trimester: Secondary | ICD-10-CM

## 2022-04-08 DIAGNOSIS — Z3483 Encounter for supervision of other normal pregnancy, third trimester: Secondary | ICD-10-CM

## 2022-04-08 LAB — GC/CHLAMYDIA PROBE AMP (~~LOC~~) NOT AT ARMC
Chlamydia: NEGATIVE
Comment: NEGATIVE
Comment: NORMAL
Neisseria Gonorrhea: NEGATIVE

## 2022-04-08 NOTE — Progress Notes (Signed)
   PRENATAL VISIT NOTE  Subjective:  Lisa Liu is a 26 y.o. G4P1021 at 86w1dbeing seen today for ongoing prenatal care.  She is currently monitored for the following issues for this low-risk pregnancy and has Fibroid uterus; Supervision of other normal pregnancy, antepartum; Ovarian cyst during pregnancy in first trimester; Insufficient weight gain in pregnancy, second trimester; and History of gestational hypertension on their problem list.  Patient reports  increasing contractions, passing mucus .  Contractions: Irritability. Vag. Bleeding: None.  Movement: Present. Denies leaking of fluid.   The following portions of the patient's history were reviewed and updated as appropriate: allergies, current medications, past family history, past medical history, past social history, past surgical history and problem list.   Objective:   Vitals:   04/08/22 0912  BP: 128/71  Pulse: 92  Weight: 170 lb 8 oz (77.3 kg)    Fetal Status: Fetal Heart Rate (bpm): 131 Fundal Height: 38 cm Movement: Present  Presentation: Vertex  General:  Alert, oriented and cooperative. Patient is in no acute distress.  Skin: Skin is warm and dry. No rash noted.   Cardiovascular: Normal heart rate noted  Respiratory: Normal respiratory effort, no problems with respiration noted  Abdomen: Soft, gravid, appropriate for gestational age.  Pain/Pressure: Present     Pelvic: Cervical exam performed in the presence of a chaperone Dilation: 1.5 Effacement (%): 60 Station: -3  Extremities: Normal range of motion.  Edema: None  Mental Status: Normal mood and affect. Normal behavior. Normal judgment and thought content.   Assessment and Plan:  Pregnancy: [id number] 372w1dhere are no diagnoses linked to this encounter. Term labor symptoms and general obstetric precautions including but not limited to vaginal bleeding, contractions, leaking of fluid and fetal movement were reviewed in detail with the  patient. Please refer to After Visit Summary for other counseling recommendations.   Return in about 1 week (around 04/15/2022) for ROB.  Future Appointments  Date Time Provider [place]9/14/2023  8:55 AM SmManya SilvasCNMallory ShirkMKettering Youth ServicesMRay[facility]9/19/2023  8:15 AM SmMinette BrineMSt Simons [facility] HospitalMSheridanCNNorth [place]

## 2022-04-10 ENCOUNTER — Other Ambulatory Visit (INDEPENDENT_AMBULATORY_CARE_PROVIDER_SITE_OTHER): Payer: Medicaid Other

## 2022-04-10 DIAGNOSIS — L299 Pruritus, unspecified: Secondary | ICD-10-CM

## 2022-04-10 DIAGNOSIS — Z3480 Encounter for supervision of other normal pregnancy, unspecified trimester: Secondary | ICD-10-CM

## 2022-04-10 NOTE — Progress Notes (Signed)
Patient presents to front office with complaint of generalized itching. Describes itching as all over her body, head, and palms of hands. Pt has not tried any medications. Reviewed with Kennon Rounds, MD who recommends bile acids and CMP to rule out cholestasis. Recommended pt try OTC med such as Benadryl, encouraged good hydration of skin. Will contact patient with abnormal results. Pt denies any pain or other concerns. Reports good FM, reviewed kick counts and return precautions.   Apolonio Schneiders RN 04/10/22

## 2022-04-12 ENCOUNTER — Inpatient Hospital Stay (EMERGENCY_DEPARTMENT_HOSPITAL)
Admission: AD | Admit: 2022-04-12 | Discharge: 2022-04-12 | Disposition: A | Discharge status: Home / Self Care | Payer: Medicaid Other | Source: Home / Self Care | Attending: Obstetrics and Gynecology | Admitting: Obstetrics and Gynecology

## 2022-04-12 ENCOUNTER — Encounter (HOSPITAL_COMMUNITY): Payer: Self-pay | Admitting: Obstetrics & Gynecology

## 2022-04-12 ENCOUNTER — Inpatient Hospital Stay (HOSPITAL_COMMUNITY)
Admission: AD | Admit: 2022-04-12 | Discharge: 2022-04-14 | DRG: 805 | Disposition: A | Discharge status: Home / Self Care | Payer: Medicaid Other | Attending: Obstetrics and Gynecology | Admitting: Obstetrics and Gynecology

## 2022-04-12 ENCOUNTER — Inpatient Hospital Stay (HOSPITAL_COMMUNITY): Payer: Medicaid Other | Admitting: Anesthesiology

## 2022-04-12 ENCOUNTER — Telehealth: Payer: Self-pay | Admitting: Obstetrics & Gynecology

## 2022-04-12 ENCOUNTER — Encounter (HOSPITAL_COMMUNITY): Payer: Self-pay | Admitting: Obstetrics and Gynecology

## 2022-04-12 DIAGNOSIS — D259 Leiomyoma of uterus, unspecified: Secondary | ICD-10-CM | POA: Diagnosis not present

## 2022-04-12 DIAGNOSIS — O2662 Liver and biliary tract disorders in childbirth: Principal | ICD-10-CM | POA: Diagnosis present

## 2022-04-12 DIAGNOSIS — Z3A38 38 weeks gestation of pregnancy: Secondary | ICD-10-CM | POA: Insufficient documentation

## 2022-04-12 DIAGNOSIS — O471 False labor at or after 37 completed weeks of gestation: Secondary | ICD-10-CM | POA: Diagnosis not present

## 2022-04-12 DIAGNOSIS — K831 Obstruction of bile duct: Secondary | ICD-10-CM | POA: Diagnosis present

## 2022-04-12 DIAGNOSIS — Z8759 Personal history of other complications of pregnancy, childbirth and the puerperium: Secondary | ICD-10-CM

## 2022-04-12 DIAGNOSIS — O326XX1 Maternal care for compound presentation, fetus 1: Secondary | ICD-10-CM | POA: Diagnosis not present

## 2022-04-12 DIAGNOSIS — D649 Anemia, unspecified: Secondary | ICD-10-CM | POA: Diagnosis not present

## 2022-04-12 DIAGNOSIS — O9902 Anemia complicating childbirth: Secondary | ICD-10-CM | POA: Diagnosis not present

## 2022-04-12 DIAGNOSIS — Z3480 Encounter for supervision of other normal pregnancy, unspecified trimester: Secondary | ICD-10-CM

## 2022-04-12 DIAGNOSIS — O3413 Maternal care for benign tumor of corpus uteri, third trimester: Secondary | ICD-10-CM | POA: Diagnosis present

## 2022-04-12 DIAGNOSIS — O164 Unspecified maternal hypertension, complicating childbirth: Secondary | ICD-10-CM | POA: Diagnosis not present

## 2022-04-12 DIAGNOSIS — O26649 Intrahepatic cholestasis of pregnancy, unspecified trimester: Secondary | ICD-10-CM | POA: Diagnosis present

## 2022-04-12 LAB — TYPE AND SCREEN
ABO/RH(D): A POS
Antibody Screen: NEGATIVE

## 2022-04-12 LAB — COMPREHENSIVE METABOLIC PANEL
ALT: 48 IU/L — ABNORMAL HIGH (ref 0–32)
AST: 47 IU/L — ABNORMAL HIGH (ref 0–40)
Albumin/Globulin Ratio: 1.1 — ABNORMAL LOW (ref 1.2–2.2)
Albumin: 3.3 g/dL — ABNORMAL LOW (ref 4.0–5.0)
Alkaline Phosphatase: 303 IU/L — ABNORMAL HIGH (ref 44–121)
BUN/Creatinine Ratio: 8 — ABNORMAL LOW (ref 9–23)
BUN: 6 mg/dL (ref 6–20)
Bilirubin Total: 0.8 mg/dL (ref 0.0–1.2)
CO2: 18 mmol/L — ABNORMAL LOW (ref 20–29)
Calcium: 9.3 mg/dL (ref 8.7–10.2)
Chloride: 103 mmol/L (ref 96–106)
Creatinine, Ser: 0.72 mg/dL (ref 0.57–1.00)
Globulin, Total: 2.9 g/dL (ref 1.5–4.5)
Glucose: 64 mg/dL — ABNORMAL LOW (ref 70–99)
Potassium: 4.2 mmol/L (ref 3.5–5.2)
Sodium: 137 mmol/L (ref 134–144)
Total Protein: 6.2 g/dL (ref 6.0–8.5)
eGFR: 117 mL/min/{1.73_m2} (ref >59)

## 2022-04-12 LAB — CBC
HCT: 31.6 % — ABNORMAL LOW (ref 36.0–46.0)
Hemoglobin: 10.4 g/dL — ABNORMAL LOW (ref 12.0–15.0)
MCH: 25.3 pg — ABNORMAL LOW (ref 26.0–34.0)
MCHC: 32.9 g/dL (ref 30.0–36.0)
MCV: 76.9 fL — ABNORMAL LOW (ref 80.0–100.0)
Platelets: 356 10*3/uL (ref 150–400)
RBC: 4.11 MIL/uL (ref 3.87–5.11)
RDW: 17.8 % — ABNORMAL HIGH (ref 11.5–15.5)
WBC: 4.8 10*3/uL (ref 4.0–10.5)
nRBC: 0 % (ref 0.0–0.2)

## 2022-04-12 LAB — BILE ACIDS, TOTAL: Bile Acids Total: 94.7 umol/L (ref 0.0–10.0)

## 2022-04-12 MED ORDER — LACTATED RINGERS IV SOLN: INTRAVENOUS | Status: DC

## 2022-04-12 MED ORDER — LACTATED RINGERS IV BOLUS
1000.0000 mL | Freq: Once | INTRAVENOUS | Status: AC
  Administered 2022-04-12: 1000 mL via INTRAVENOUS

## 2022-04-12 MED ORDER — OXYTOCIN-SODIUM CHLORIDE 30-0.9 UT/500ML-% IV SOLN
2.5000 [IU]/h | INTRAVENOUS | Status: DC
  Administered 2022-04-13: 2.5 [IU]/h via INTRAVENOUS
  Filled 2022-04-12: qty 500

## 2022-04-12 MED ORDER — EPHEDRINE 5 MG/ML INJ
10.0000 mg | INTRAVENOUS | Status: DC | PRN
Start: 2022-04-12 — End: 2022-04-13

## 2022-04-12 MED ORDER — FENTANYL-BUPIVACAINE-NACL 0.5-0.125-0.9 MG/250ML-% EP SOLN
12.0000 mL/h | EPIDURAL | Status: DC | PRN
  Administered 2022-04-12: 12 mL/h via EPIDURAL
  Filled 2022-04-12: qty 250

## 2022-04-12 MED ORDER — OXYCODONE-ACETAMINOPHEN 5-325 MG PO TABS: 1.0000 | ORAL_TABLET | ORAL | Status: DC | PRN

## 2022-04-12 MED ORDER — DIPHENHYDRAMINE HCL 50 MG/ML IJ SOLN
12.5000 mg | INTRAMUSCULAR | Status: DC | PRN
  Administered 2022-04-12: 12.5 mg via INTRAVENOUS
  Filled 2022-04-12: qty 1

## 2022-04-12 MED ORDER — LIDOCAINE HCL (PF) 1 % IJ SOLN
INTRAMUSCULAR | Status: DC | PRN
  Administered 2022-04-12 (×2): 5 mL via EPIDURAL

## 2022-04-12 MED ORDER — ACETAMINOPHEN 325 MG PO TABS: 650.0000 mg | ORAL_TABLET | ORAL | Status: DC | PRN

## 2022-04-12 MED ORDER — OXYTOCIN-SODIUM CHLORIDE 30-0.9 UT/500ML-% IV SOLN
1.0000 m[IU]/min | INTRAVENOUS | Status: DC
  Administered 2022-04-12: 2 m[IU]/min via INTRAVENOUS

## 2022-04-12 MED ORDER — LIDOCAINE HCL (PF) 1 % IJ SOLN: 30.0000 mL | INTRAMUSCULAR | Status: DC | PRN

## 2022-04-12 MED ORDER — PHENYLEPHRINE 80 MCG/ML (10ML) SYRINGE FOR IV PUSH (FOR BLOOD PRESSURE SUPPORT)
80.0000 ug | PREFILLED_SYRINGE | INTRAVENOUS | Status: DC | PRN
Start: 2022-04-12 — End: 2022-04-13
  Administered 2022-04-12: 80 ug via INTRAVENOUS

## 2022-04-12 MED ORDER — OXYTOCIN BOLUS FROM INFUSION
333.0000 mL | Freq: Once | INTRAVENOUS | Status: AC
  Administered 2022-04-12: 333 mL via INTRAVENOUS

## 2022-04-12 MED ORDER — LACTATED RINGERS IV SOLN: 500.0000 mL | INTRAVENOUS | Status: DC | PRN

## 2022-04-12 MED ORDER — OXYCODONE-ACETAMINOPHEN 5-325 MG PO TABS: 2.0000 | ORAL_TABLET | ORAL | Status: DC | PRN

## 2022-04-12 MED ORDER — LACTATED RINGERS IV SOLN: 500.0000 mL | Freq: Once | INTRAVENOUS | Status: DC

## 2022-04-12 MED ORDER — FLEET ENEMA 7-19 GM/118ML RE ENEM: 1.0000 | ENEMA | RECTAL | Status: DC | PRN

## 2022-04-12 MED ORDER — PHENYLEPHRINE 80 MCG/ML (10ML) SYRINGE FOR IV PUSH (FOR BLOOD PRESSURE SUPPORT)
80.0000 ug | PREFILLED_SYRINGE | INTRAVENOUS | Status: DC | PRN
  Filled 2022-04-12: qty 10

## 2022-04-12 MED ORDER — SOD CITRATE-CITRIC ACID 500-334 MG/5ML PO SOLN
30.0000 mL | ORAL | Status: DC | PRN
Start: 2022-04-12 — End: 2022-04-13

## 2022-04-12 MED ORDER — TERBUTALINE SULFATE 1 MG/ML IJ SOLN: 0.2500 mg | Freq: Once | INTRAMUSCULAR | Status: DC | PRN

## 2022-04-12 MED ORDER — ONDANSETRON HCL 4 MG/2ML IJ SOLN
4.0000 mg | Freq: Four times a day (QID) | INTRAMUSCULAR | Status: DC | PRN
Start: 2022-04-12 — End: 2022-04-13

## 2022-04-12 NOTE — MAU Provider Note (Signed)
  S: Lisa Liu is a 26 y.o. [phone] at [redacted]w[redacted]d who presents to MAU today complaining contractions. She denies vaginal bleeding. She denies LOF. She reports normal fetal movement.    O: BP 112/77   Pulse 80   Temp 97.6 F (36.4 C) (Oral)   Resp 20   Ht '5\' 5"'$  (1.651 m)   Wt 76.8 kg   LMP  (LMP Unknown)   BMI 28.19 kg/m   Cervical exam:  Dilation: 2 Effacement (%): 60 Station: -2 Presentation: Vertex Exam by:: WHerb Grays RN   Fetal Monitoring: Baseline: 140bpm Variability: moderate Accelerations: >2 15 X 15 accels Decelerations: no decels Contractions: every 5-7 mins   A: SIUP at 31w5dlikely dehydrated. Good accels noted, after 1litre bolus and contractions waned. Cervix the same after 3 hrs False labor  P: Discharge home Recommend good hydration and labor precautions.  NdStormy CardMD 04/12/2022 9:35 AM

## 2022-04-12 NOTE — Progress Notes (Signed)
Rechecked patient's strip.  Doing better, no further decels - back to Category 1 Contractions every 2-3 mins.  Continue monitoring  Anticipate VD.  Daelynn Blower Sherrilyn Rist, MD

## 2022-04-12 NOTE — MAU Note (Signed)
I have communicated with Fatima Blank, CNM and Mikki Santee, MD and reviewed vital signs:  Vitals:   04/12/22 1108  BP: 127/79  Pulse: (!) 103    Vaginal exam:  2/60/-2  Also reviewed contraction pattern and that non-stress test is reactive.  It has been documented that patient is contracting every 3-7 minutes with no cervical change over 3 hours not indicating active labor.  Patient denies any other complaints.  Based on this report provider has given order for discharge.  A discharge order and diagnosis entered by a provider.   Labor discharge instructions reviewed with patient.  Patient verbalized understanding on when to return to the hospital.

## 2022-04-12 NOTE — Telephone Encounter (Signed)
Pt called and notified to come in for IOL due to cholestasis of pregnancy  Janyth Pupa, DO Attending [place], [facility] for [employer], [place]

## 2022-04-12 NOTE — Anesthesia Procedure Notes (Signed)
Epidural Patient location during procedure: OB Start time: 04/12/2022 4:15 PM End time: 04/12/2022 4:23 PM  Preanesthetic Checklist Completed: patient identified, IV checked, site marked, risks and benefits discussed, surgical consent, monitors and equipment checked, pre-op evaluation and timeout performed  Epidural Patient position: sitting Prep: DuraPrep and site prepped and draped Patient monitoring: continuous pulse ox and blood pressure Approach: midline Location: L3-L4 Injection technique: LOR air  Needle:  Needle type: Tuohy  Needle gauge: 17 G Needle length: 9 cm and 9 Needle insertion depth: 5 cm Catheter type: closed end flexible Catheter size: 19 Gauge Catheter at skin depth: 10 cm Test dose: negative and Other  Assessment Events: blood not aspirated, injection not painful, no injection resistance, no paresthesia and negative IV test  Additional Notes Patient identified. Risks and benefits discussed including failed block, incomplete  Pain control, post dural puncture headache, nerve damage, paralysis, blood pressure Changes, nausea, vomiting, reactions to medications-both toxic and allergic and post Partum back pain. All questions were answered. Patient expressed understanding and wished to proceed. Sterile technique was used throughout procedure. Epidural site was Dressed with sterile barrier dressing. No paresthesias, signs of intravascular injection Or signs of intrathecal spread were encountered. Attempt  x 2. Difficult due to poor positioning. Patient was more comfortable after the epidural was dosed. Please see RN's note for documentation of vital signs and FHR which are stable. Reason for block:procedure for pain

## 2022-04-12 NOTE — Progress Notes (Signed)
Labor Progress Note Lisa Liu is a 26 y.o. [id number] at 62w5dpresented for IOL for ICP S: doing well. FB fell out after about 1 hr. Just got an epidural and is comfortable. Baby had some decels following epidural. Contractions every 2 mins  O:  BP 119/79   Pulse 75   Temp 98.2 F (36.8 C) (Oral)   Resp 18   Ht '5\' 5"'$  (1.651 m)   Wt 76.7 kg   LMP  (LMP Unknown)   SpO2 100%   BMI 28.12 kg/m  EFM: 145 bpm/moderate/+accels, variable decels noted.  CVE: Dilation: 4 Effacement (%): 60 Station: Ballotable Presentation: Vertex Exam by:: Dr. NTrina Ao  A&P: 26y.o. [id number] IOL for ICP. S/p FB, on pitocin 4 units. #Labor: Progressing well. In latent phase. Fetal head still very high and ballotable, so will hold off AROM - cut back pitocin to 2 units for about 30 mins,. Given tachysytole, and then can slowly titrate up again per protocol #Pain: epidural in place #FWB: Category 2, due to 2 late decels noted #GBS negative  Recheck in 4hrs, sooner if indicated.   ChLiliane ChannelD MPH OB Fellow, [place]/04/2022

## 2022-04-12 NOTE — MAU Note (Signed)
VE  in office on Tuesday- 1-2 cm UC- strong since 0300 Denies HSV GBS- neg

## 2022-04-12 NOTE — H&P (Signed)
OBSTETRIC ADMISSION HISTORY AND PHYSICAL  Lisa Liu is a 26 y.o. female [phone] with IUP at 14w5dby early ultrasound presenting for IOL due to cholestasis of pregnancy.  Pt had reported itching, bile acids returned elevated. She reports +FMs, No LOF, no VB, no blurry vision, headaches or peripheral edema, and RUQ pain.  She plans on breast/bottle feeding. She request condoms for birth control. [facility]    Dating: By early ultrasound --->  Estimated Date of Delivery: 04/21/22  Sono:    '@[redacted]w[redacted]d'$ , CWD, normal anatomy, cephalic presentation, 23536R 5#15oz, 55% EFW   Prenatal History/Complications:  -Uterine fibroids- last measured by [facility] 01/20[facility]22: 3 fibroids- mostly anterior- largest 4.1x4x4cm  Past Medical History: Past Medical History:  Diagnosis Date   Anemia    Fibroid    Fibroids    Gestational hypertension 05/12/2017   Insufficient weight gain during pregnancy 04/02/2017   Pregnancy induced hypertension     Past Surgical History: History reviewed. No pertinent surgical history.  Obstetrical History: OB History     Gravida  4   Para  1   Term  1   Preterm      AB  2   Living  1      SAB  2   IAB      Ectopic      Multiple  0   Live Births  1         2018-FTNSVD, 317gSAB x 2  Social History Social History   Socioeconomic History   Marital status: Married    Spouse name: Not on file   Number of children: Not on file   Years of education: Not on file   Highest education level: Not on file  Occupational History   Not on file  Tobacco Use   Smoking status: Never   Smokeless tobacco: Never  Vaping Use   Vaping Use: Never used  Substance and Sexual Activity   Alcohol use: No   Drug use: No   Sexual activity: Not Currently    Birth control/protection: None  Other Topics Concern   Not on file  Social History Narrative   Not on file   Social Determinants of Health    Financial Resource Strain: Not on file  Food Insecurity: No Food Insecurity (04/12/2022)   Hunger Vital Sign    Worried About Running Out of Food in the Last Year: Never true    Ran Out of Food in the Last Year: Never true  Transportation Needs: No Transportation Needs (04/12/2022)   PRAPARE - THydrologist(Medical): No    Lack of Transportation (Non-Medical): No  Physical Activity: Not on file  Stress: Not on file  Social Connections: Not on file    Family History: Family History  Problem Relation Age of Onset   Healthy Mother    Varicose Veins Father    Diabetes Paternal Grandmother    Hyperlipidemia Paternal Grandmother     Allergies: No Known Allergies  Medications Prior to Admission  Medication Sig Dispense Refill Last Dose   pantoprazole (PROTONIX) 40 MG tablet Take 1 tablet (40 mg total) by mouth daily. 30 tablet 3 04/12/2022   Prenatal Vit-Fe Fumarate-FA (PRENATAL COMPLETE) 14-0.4 MG TABS Take 1 tablet by mouth daily. 60 tablet 0 04/12/2022   acetaminophen (TYLENOL) 325 MG tablet Take 325 mg by mouth every 6 (six) hours as needed.  aspirin EC 81 MG tablet Take 1 tablet (81 mg total) by mouth daily. Swallow whole. Start taking on 10/14/21. 30 tablet 11    Blood Pressure Monitoring DEVI 1 each by Does not apply route once a week. 1 each 0    ferrous sulfate 325 (65 FE) MG tablet Take 1 tablet (325 mg total) by mouth every other day. 30 tablet 2    fluticasone (FLONASE) 50 MCG/ACT nasal spray Place 2 sprays into both nostrils daily. (Patient not taking: Reported on 02/25/2022) 11.1 mL 2    Misc. Devices (GOJJI WEIGHT SCALE) MISC 1 each by Does not apply route once a week. 1 each 0    ondansetron (ZOFRAN) 8 MG tablet Take 1 tablet (8 mg total) by mouth every 8 (eight) hours as needed for nausea or vomiting. 30 tablet 6    promethazine (PHENERGAN) 25 MG tablet Take 1 tablet (25 mg total) by mouth every 6 (six) hours as needed for nausea or vomiting.  (Patient not taking: Reported on 02/11/2022) 30 tablet 0      Review of Systems   All systems reviewed and negative except as stated in HPI  Blood pressure 119/74, pulse 91, temperature 98.1 F (36.7 C), temperature source Oral, resp. rate 18, height '5\' 5"'$  (1.651 m), weight 76.7 kg, unknown if currently breastfeeding. General appearance: alert, cooperative, and no distress Lungs: clear to auscultation bilaterally Heart: regular rate and rhythm Abdomen: soft, non-tender; bowel sounds normal GU: 2/50/-3, Cook balloon placed with 40cc of fluid Extremities: Homans sign is negative, no sign of DVT Presentation: cephalic Fetal monitoringBaseline: 135 bpm, Variability: moderate, Accelerations: +15x15 accels, and Decelerations: Absent Uterine activityirregular     Prenatal labs: ABO, Rh: --/--/A POS (09/09 1216) Antibody: NEG (09/09 1216) Rubella: >33.00 (03/08 1537) RPR: Non Reactive (06/16 0839)  HBsAg: Negative (03/08 1537)  HIV: Non Reactive (06/16 0839)  GBS: Negative/-- (08/22 1126)  2 hr Glucola normal  Genetic screening  low risk female Anatomy US normal  Prenatal Transfer Tool  Maternal Diabetes: No Genetic Screening: Normal Maternal Ultrasounds/Referrals: Normal Fetal Ultrasounds or other Referrals:  Referred to Materal Fetal Medicine  Maternal Substance Abuse:  No Significant Maternal Medications:  None Significant Maternal Lab Results:  Group B Strep negative Number of Prenatal Visits:greater than 3 verified prenatal visits Other Comments:  None  Results for orders placed or performed during the hospital encounter of 04/12/22 (from the past 24 hour(s))  CBC   Collection Time: 04/12/22 12:16 PM  Result Value Ref Range   WBC 4.8 4.0 - 10.5 K/uL   RBC 4.11 3.87 - 5.11 MIL/uL   Hemoglobin 10.4 (L) 12.0 - 15.0 g/dL   HCT 31.6 (L) 36.0 - 46.0 %   MCV 76.9 (L) 80.0 - 100.0 fL   MCH 25.3 (L) 26.0 - 34.0 pg   MCHC 32.9 30.0 - 36.0 g/dL   RDW 17.8 (H) 11.5 - 15.5 %    Platelets 356 150 - 400 K/uL   nRBC 0.0 0.0 - 0.2 %  Type and screen [address]   Collection Time: 04/12/22 12:16 PM  Result Value Ref Range   ABO/RH(D) A POS    Antibody Screen NEG    Sample Expiration      04/15/2022,2359 Performed at [facility], [address] [place],  [address]     Patient Active Problem List   Diagnosis Date Noted   [redacted] weeks gestation of pregnancy 04/12/2022   Intrahepatic cholestasis of pregnancy 04/12/2022  History of gestational hypertension 02/27/2022   Insufficient weight gain in pregnancy, second trimester 01/14/2022   Ovarian cyst during pregnancy in first trimester 10/21/2021   Supervision of other normal pregnancy, antepartum 09/18/2021   Fibroid uterus 07/18/2016    Assessment/Plan:  Lisa Liu is a 26 y.o. G4P1021 at 71w5dhere forIOL due to cholestasis of pregnancy  #Labor:Cook balloon placed, start Pit per protocol #Pain: Desires epidural #FWB: NICHD- Cat. I #ID:  GBS neg #MOF: breast/bottle feeding #MOC:circ #Circ:  yes  JJanyth Pupa DO Attending [place] FHorinefor [facility]De[employer] [facility] CNew [place]

## 2022-04-12 NOTE — Anesthesia Preprocedure Evaluation (Signed)
Anesthesia Evaluation  Patient identified by MRN, date of birth, ID band Patient awake    Reviewed: Allergy & Precautions, Patient's Chart, lab work & pertinent test results  Airway Mallampati: II  TM Distance: >3 FB Neck ROM: Full    Dental no notable dental hx. (+) Teeth Intact, Dental Advisory Given   Pulmonary neg pulmonary ROS,    Pulmonary exam normal breath sounds clear to auscultation       Cardiovascular hypertension, Normal cardiovascular exam Rhythm:Regular Rate:Normal     Neuro/Psych negative neurological ROS  negative psych ROS   GI/Hepatic Neg liver ROS, GERD  ,Cholestasis of pregnancy   Endo/Other  negative endocrine ROS  Renal/GU negative Renal ROS  negative genitourinary   Musculoskeletal negative musculoskeletal ROS (+)   Abdominal   Peds  Hematology  (+) Blood dyscrasia, anemia ,   Anesthesia Other Findings   Reproductive/Obstetrics (+) Pregnancy                             Anesthesia Physical Anesthesia Plan  ASA: 2  Anesthesia Plan: Epidural   Post-op Pain Management:    Induction:   PONV Risk Score and Plan:   Airway Management Planned: Natural Airway  Additional Equipment:   Intra-op Plan:   Post-operative Plan:   Informed Consent: I have reviewed the patients History and Physical, chart, labs and discussed the procedure including the risks, benefits and alternatives for the proposed anesthesia with the patient or authorized representative who has indicated his/her understanding and acceptance.       Plan Discussed with: Anesthesiologist  Anesthesia Plan Comments:         Anesthesia Quick Evaluation

## 2022-04-13 ENCOUNTER — Encounter (HOSPITAL_COMMUNITY): Payer: Self-pay | Admitting: Obstetrics & Gynecology

## 2022-04-13 LAB — RPR: RPR Ser Ql: NONREACTIVE

## 2022-04-13 MED ORDER — ZOLPIDEM TARTRATE 5 MG PO TABS: 5.0000 mg | ORAL_TABLET | Freq: Every evening | ORAL | Status: DC | PRN

## 2022-04-13 MED ORDER — SENNOSIDES-DOCUSATE SODIUM 8.6-50 MG PO TABS
2.0000 | ORAL_TABLET | ORAL | Status: DC
  Administered 2022-04-14: 2 via ORAL
  Filled 2022-04-13 (×2): qty 2

## 2022-04-13 MED ORDER — IBUPROFEN 600 MG PO TABS
600.0000 mg | ORAL_TABLET | Freq: Four times a day (QID) | ORAL | Status: DC
  Administered 2022-04-13 – 2022-04-14 (×6): 600 mg via ORAL
  Filled 2022-04-13 (×6): qty 1

## 2022-04-13 MED ORDER — ONDANSETRON HCL 4 MG/2ML IJ SOLN: 4.0000 mg | INTRAMUSCULAR | Status: DC | PRN

## 2022-04-13 MED ORDER — SIMETHICONE 80 MG PO CHEW: 80.0000 mg | CHEWABLE_TABLET | ORAL | Status: DC | PRN

## 2022-04-13 MED ORDER — WITCH HAZEL-GLYCERIN EX PADS: 1.0000 | MEDICATED_PAD | CUTANEOUS | Status: DC | PRN

## 2022-04-13 MED ORDER — ONDANSETRON HCL 4 MG PO TABS: 4.0000 mg | ORAL_TABLET | ORAL | Status: DC | PRN

## 2022-04-13 MED ORDER — PRENATAL MULTIVITAMIN CH
1.0000 | ORAL_TABLET | Freq: Every day | ORAL | Status: DC
  Administered 2022-04-13 – 2022-04-14 (×2): 1 via ORAL
  Filled 2022-04-13 (×2): qty 1

## 2022-04-13 MED ORDER — TETANUS-DIPHTH-ACELL PERTUSSIS 5-2.5-18.5 LF-MCG/0.5 IM SUSY: 0.5000 mL | PREFILLED_SYRINGE | Freq: Once | INTRAMUSCULAR | Status: DC

## 2022-04-13 MED ORDER — DIPHENHYDRAMINE HCL 25 MG PO CAPS: 25.0000 mg | ORAL_CAPSULE | Freq: Four times a day (QID) | ORAL | Status: DC | PRN

## 2022-04-13 MED ORDER — ACETAMINOPHEN 325 MG PO TABS: 650.0000 mg | ORAL_TABLET | ORAL | Status: DC | PRN

## 2022-04-13 MED ORDER — COCONUT OIL OIL
1.0000 | TOPICAL_OIL | Status: DC | PRN
Start: 2022-04-13 — End: 2022-04-14

## 2022-04-13 MED ORDER — BENZOCAINE-MENTHOL 20-0.5 % EX AERO
1.0000 | INHALATION_SPRAY | CUTANEOUS | Status: DC | PRN
  Administered 2022-04-13: 1 via TOPICAL
  Filled 2022-04-13: qty 56

## 2022-04-13 MED ORDER — DIBUCAINE (PERIANAL) 1 % EX OINT: 1.0000 | TOPICAL_OINTMENT | CUTANEOUS | Status: DC | PRN

## 2022-04-13 NOTE — Plan of Care (Signed)
  Problem: Education: Goal: Knowledge of condition will improve Outcome: Completed/Met

## 2022-04-13 NOTE — Anesthesia Postprocedure Evaluation (Signed)
Anesthesia Post Note  Patient: Lisa Liu  Procedure(s) Performed: AN AD [place]     Patient location during evaluation: Mother Baby Anesthesia Type: Epidural Level of consciousness: awake, oriented and awake and alert Pain management: pain level controlled Vital Signs Assessment: post-procedure vital signs reviewed and stable Respiratory status: spontaneous breathing, respiratory function stable and nonlabored ventilation Cardiovascular status: stable Postop Assessment: no headache, adequate PO intake, able to ambulate, patient able to bend at knees, no backache and no apparent nausea or vomiting Anesthetic complications: no   No notable events documented.  Last Vitals:  Vitals:   04/13/22 0229 04/13/22 0515  BP: 124/71 115/67  Pulse: 79 82  Resp: 18 18  Temp: 36.9 C 36.9 C  SpO2:      Last Pain:  Vitals:   04/13/22 0515  TempSrc: Oral  PainSc:    Pain Goal:                   Zissel Biederman

## 2022-04-13 NOTE — Lactation Note (Signed)
This note was copied from a baby's chart. Lactation Consultation Note  Patient Name: Lisa Liu FRTMY'T Date: 04/13/2022 Reason for consult: Initial assessment;Early term 37-38.6wks (P 2 , exp BF, per Birth parent recently attempted at 67, baby sleepy. LC encouraged birth parento call with feeding cues.) Age:28 hours LC encouraged to call with feeding cues.  Maternal Data Has patient been taught Hand Expression?:  (per birth parent - + breast changes with pregnancy) Does the patient have breastfeeding experience prior to this delivery?: Yes How long did the patient breastfeed?: per birth parent - 1st baby 8 months , 1st 2 weeks were challenging. ( she is now 58-1/28 years old)  Feeding Mother's Current Feeding Choice: Breast Milk  LATCH Score ( Latch score by the [facility] )  Latch: Too sleepy or reluctant, no latch achieved, no sucking elicited.  Audible Swallowing: None  Type of Nipple: Flat  Comfort (Breast/Nipple): Soft / non-tender  Hold (Positioning): Assistance needed to correctly position infant at breast and maintain latch.  LATCH Score: 4   Lactation Tools Discussed/Used    Interventions Interventions: Breast feeding basics reviewed;Education;LC Services brochure  Discharge Pump: DEBP;Personal  Consult Status Consult Status: Follow-up Date: 04/13/22 Follow-up type: In-patient    [place] 04/13/2022, 9:36 AM

## 2022-04-13 NOTE — Progress Notes (Signed)
POSTPARTUM PROGRESS NOTE  Post Partum Day 1  Subjective:  Maclaine Ahola is a 26 y.o. [id number] s/p NSVD at [redacted]w[redacted]d  She reports she is doing well. No acute events overnight. She denies any problems with ambulating, voiding or po intake. Denies nausea or vomiting.  Pain is well controlled.  Lochia is minimal.  Objective: Blood pressure 115/67, pulse 82, temperature 98.5 F (36.9 C), temperature source Oral, resp. rate 18, height '5\' 5"'$  (1.651 m), weight 76.7 kg, SpO2 100 %, unknown if currently breastfeeding.  Physical Exam:  General: alert, cooperative and no distress Chest: no respiratory distress Heart:regular rate, distal pulses intact Abdomen: soft, nontender,  Uterine Fundus: firm, appropriately tender DVT Evaluation: No calf swelling or tenderness Extremities: No LE edema Skin: warm, dry  Recent Labs    04/12/22 1216  HGB 10.4*  HCT 31.6*    Assessment/Plan: OJleigh Striplinis a 26y.o. [id number]/p NSVD at 373w5d PPD#1 - Doing well  Routine postpartum care Hx of gHTN- BPs appropriate Contraception: Condoms Feeding: Both Dispo: Plan for discharge 9/11.   LOS: 1 day   SiGerlene FeeDO OB Fellow, [place]/05/2022, 7:37 AM

## 2022-04-13 NOTE — Discharge Summary (Shared)
Postpartum Discharge Summary  Date of Service updated***     Patient Name: Lisa Liu DOB: 1995-09-07 MRN: [id number]  Date of admission: 04/12/2022 Delivery date:04/12/2022  Delivering provider: Concepcion Living  Date of discharge: 04/13/2022  Admitting diagnosis: Intrahepatic cholestasis of pregnancy [O26.619, K83.1] Intrauterine pregnancy: [redacted]w[redacted]d    Secondary diagnosis:  Principal Problem:   Intrahepatic cholestasis of pregnancy Active Problems:   Supervision of other normal pregnancy, antepartum   History of gestational hypertension   [redacted] weeks gestation of pregnancy   Vaginal delivery  Additional problems: None    Discharge diagnosis: Term Pregnancy Delivered                                              Post partum procedures:{Postpartum procedures:23558} Augmentation: AROM, Pitocin, and IP Foley Complications: None  Hospital course: Induction of Labor With Vaginal Delivery   26y.o. yo [id number] 370w6das admitted to the hospital 04/12/2022 for induction of labor.  Indication for induction: Cholestasis of pregnancy.  Patient had an uncomplicated labor course as follows: Membrane Rupture Time/Date: 8:58 PM ,04/12/2022   Delivery Method:Vaginal, Spontaneous  Episiotomy: None  Lacerations:  2nd degree;Perineal  Details of delivery can be found in separate delivery note.  Patient had a routine postpartum course. Patient is discharged home 04/13/22.  Newborn Data: Birth date:04/12/2022  Birth time:11:40 PM  Gender:Female  Living status:Living  Apgars:9 ,9  Weight:   Magnesium Sulfate received: {Mag received:30440022} BMZ received: {BMZ received:30440023} Rhophylac:{Rhophylac received:30440032} MMTJQ:{ZES:92330076}-DaP:{Tdap:23962} Flu: {F{AUQ:33354}ransfusion:{Transfusion received:30440034}  Physical exam  Vitals:   04/12/22 2300 04/12/22 2330 04/12/22 2345 04/13/22 0000  BP: 110/71 119/70 121/63 129/82  Pulse: 86 82 81 87  Resp: 18     Temp:       TempSrc:      SpO2:      Weight:      Height:       General: {Exam; general:21111117} Lochia: {Desc; appropriate/inappropriate:30686::"appropriate"} Uterine Fundus: {Desc; firm/soft:30687} Incision: {Exam; incision:21111123} DVT Evaluation: {Exam; dvt:2111122} Labs: Lab Results  Component Value Date   WBC 4.8 04/12/2022   HGB 10.4 (L) 04/12/2022   HCT 31.6 (L) 04/12/2022   MCV 76.9 (L) 04/12/2022   PLT 356 04/12/2022      Latest Ref Rng & Units 04/10/2022    8:56 AM  CMP  Glucose 70 - 99 mg/dL 64   BUN 6 - 20 mg/dL 6   Creatinine 0.57 - 1.00 mg/dL 0.72   Sodium 134 - 144 mmol/L 137   Potassium 3.5 - 5.2 mmol/L 4.2   Chloride 96 - 106 mmol/L 103   CO2 20 - 29 mmol/L 18   Calcium 8.7 - 10.2 mg/dL 9.3   Total Protein 6.0 - 8.5 g/dL 6.2   Total Bilirubin 0.0 - 1.2 mg/dL 0.8   Alkaline Phos 44 - 121 IU/L 303   AST 0 - 40 IU/L 47   ALT 0 - 32 IU/L 48    Edinburgh Score:    06/12/2017    9:54 AM  Edinburgh Postnatal Depression Scale Screening Tool  I have been able to laugh and see the funny side of things. 0  I have looked forward with enjoyment to things. 0  I have blamed myself unnecessarily when things went wrong. 0  I have been anxious or worried for no good reason. 0  I have felt scared or panicky for no good reason. 2  Things have been getting on top of me. 1  I have been so unhappy that I have had difficulty sleeping. 0  I have felt sad or miserable. 0  I have been so unhappy that I have been crying. 0  The thought of harming myself has occurred to me. 0  Edinburgh Postnatal Depression Scale Total 3     After visit meds:  Allergies as of 04/13/2022   No Known Allergies   Med Rec must be completed prior to using this [facility]***        Discharge home in stable condition Infant Feeding: {Baby feeding:23562} Infant Disposition:{CHL IP OB HOME WITH HPDFGI:92004} Discharge instruction: per After Visit Summary and Postpartum booklet. Activity: Advance  as tolerated. Pelvic rest for 6 weeks.  Diet: {OB HHTX:01237990} Future Appointments: Future Appointments  Date Time Provider [place]  04/17/2022  8:55 AM Manya Silvas, Mallory Shirk [facility]  04/22/2022  8:15 AM Manya Silvas, CNM [facility]   Follow up Visit:  Message sent 04/13/22   Please schedule this patient for a In person postpartum visit in 6 weeks with the following provider: Any provider. Additional Postpartum F/U: None   High risk pregnancy complicated by:  cholestasis of pregnancy  Delivery mode:  Vaginal, Spontaneous  Anticipated Birth Control:  Condoms   04/13/2022 Concepcion Living, MD

## 2022-04-14 MED ORDER — IBUPROFEN 600 MG PO TABS: 600.0000 mg | ORAL_TABLET | Freq: Four times a day (QID) | ORAL | 0 refills | Status: DC

## 2022-04-14 NOTE — Lactation Note (Signed)
This note was copied from a baby's chart. Lactation Consultation Note  Patient Name: Lisa Liu EVQWQ'V Date: 04/14/2022 Reason for consult: Follow-up assessment;Early term 37-38.6wks;Infant weight loss (7 % weight loss, LC reviewed doc flow sheets and they corrrelate with output. per Birth parent - baby was circ'd and sleepy at present. Attempted when baby came back from circ.) Age:28 hours LC reviewed the BF D/C teaching and [place] resources after D/C.    Maternal Data    Feeding Mother's Current Feeding Choice: Breast Milk  LATCH Score - LC unable to see a latch, baby asleep post circ.     Interventions Breast feeding basics and education.     Discharge Discharge Education: Engorgement and breast care;Warning signs for feeding baby Pump: DEBP;Personal  Consult Status Consult Status: Complete Date: 04/14/22    Jerlyn Ly [name] 04/14/2022, 11:59 AM

## 2022-04-17 ENCOUNTER — Encounter: Payer: Medicaid Other | Admitting: Student

## 2022-04-17 ENCOUNTER — Encounter: Payer: Medicaid Other | Admitting: Advanced Practice Midwife

## 2022-04-21 ENCOUNTER — Telehealth (HOSPITAL_COMMUNITY): Payer: Self-pay | Admitting: *Deleted

## 2022-04-21 NOTE — Telephone Encounter (Signed)
Left phone voicemail message.  Odis Hollingshead, RN 04-21-2022 at 9:56am

## 2022-04-22 ENCOUNTER — Encounter: Payer: Medicaid Other | Admitting: Advanced Practice Midwife

## 2022-04-24 ENCOUNTER — Encounter: Payer: Medicaid Other | Admitting: Student

## 2022-04-29 ENCOUNTER — Encounter: Payer: Medicaid Other | Admitting: Medical

## 2022-05-04 DIAGNOSIS — Z419 Encounter for procedure for purposes other than remedying health state, unspecified: Secondary | ICD-10-CM | POA: Diagnosis not present

## 2022-05-22 ENCOUNTER — Other Ambulatory Visit: Payer: Self-pay

## 2022-05-22 ENCOUNTER — Encounter: Payer: Self-pay | Admitting: Advanced Practice Midwife

## 2022-05-22 ENCOUNTER — Ambulatory Visit (INDEPENDENT_AMBULATORY_CARE_PROVIDER_SITE_OTHER): Payer: Medicaid Other | Admitting: Advanced Practice Midwife

## 2022-05-22 NOTE — Progress Notes (Signed)
Pt states does not want birthcontrol at this time.

## 2022-05-22 NOTE — Progress Notes (Signed)
[place] Partum Visit Note  Lisa Liu is a 26 y.o. [phone] female who presents for a postpartum visit. She is 5 weeks postpartum following a normal spontaneous vaginal delivery.  I have fully reviewed the prenatal and intrapartum course. The delivery was at 38/6 gestational weeks.  Anesthesia: epidural. Postpartum course has been complications. Baby is doing well. Baby is feeding by both breast and bottle - [employer] Good Start. Bleeding no bleeding. Bowel function is normal. Bladder function is normal. Patient is not sexually active. Contraception method is condoms. Postpartum depression screening: negative.    [place] - 05/22/22 1029       Pregnancy Intention Screening   Does the patient want to become pregnant in the next year? No    Does the patient's partner want to become pregnant in the next year? No    Would the patient like to discuss contraceptive options today? No      Contraception Wrap Up   Current Method Abstinence    End Method Female Condom    Contraception Counseling Provided Yes    How was the end contraceptive method provided? N/A            The pregnancy intention screening data noted above was reviewed. Potential methods of contraception were discussed. The patient elected to proceed with Female Condom.     The following portions of the patient's history were reviewed and updated as appropriate: allergies, current medications, past family history, past medical history, past social history, past surgical history, and problem list.  Review of Systems Pertinent items are noted in HPI.  Objective:  There were no vitals taken for this visit.   General:  alert, cooperative, appears stated age, and no distress   Breasts:  not indicated  Lungs: Nml rate and effort  Heart:  Nml rate  Abdomen: soft, non-tender; bowel sounds normal; no masses,  no organomegaly   Wound NA  GU exam:  not indicated       Assessment:    1. Postpartum care and  examination  - Ambulatory referral to Lactation  2. Breastfeeding problem in newborn  - Ambulatory referral to Lactation   Plan:   Essential components of care per ACOG recommendations:  1.  Mood and well being: Patient with negative depression screening today. Reviewed local resources for support.  - Patient tobacco use? No.   - hx of drug use? No.    2. Infant care and feeding:  -Patient currently breastmilk feeding? Yes. Discussed returning to work and pumping. Reviewed importance of draining breast regularly to support lactation.  -Social determinants of health (SDOH) reviewed in EPIC. No concerns. The following needs were identified: requests [employer] today.  3. Sexuality, contraception and birth spacing - Patient does not want a pregnancy in the next year.  Desired family size is 3 children.  - Reviewed reproductive life planning. Reviewed contraceptive methods based on pt preferences and effectiveness.  Patient desired Female Condom today.   - Discussed birth spacing of 18 months  4. Sleep and fatigue -Encouraged family/partner/community support of 4 hrs of uninterrupted sleep to help with mood and fatigue  5. Physical Recovery  - Discussed patients delivery and complications. She describes her labor as good. except for epidural not working.  - Patient had a Vaginal, no problems at delivery. Patient had a 2nd degree laceration. Perineal healing reviewed. Patient expressed understanding - Patient has urinary incontinence? No. - Patient is safe to resume physical and sexual activity  6.  Health Maintenance - HM due items addressed No - NA - Last pap smear  Diagnosis  Date Value Ref Range Status  10/09/2021   Final   - Negative for intraepithelial lesion or malignancy (NILM)   Pap smear not done at today's visit.  -Breast Cancer screening indicated? No.   7. Chronic Disease/Pregnancy Condition follow up: None  - PCP follow up  Manya Silvas, [place] for  [place]

## 2022-06-04 DIAGNOSIS — Z419 Encounter for procedure for purposes other than remedying health state, unspecified: Secondary | ICD-10-CM | POA: Diagnosis not present

## 2022-06-11 ENCOUNTER — Other Ambulatory Visit (HOSPITAL_COMMUNITY)
Admission: RE | Admit: 2022-06-11 | Discharge: 2022-06-11 | Disposition: A | Discharge status: Home / Self Care | Payer: Medicaid Other | Source: Ambulatory Visit | Attending: Family Medicine | Admitting: Family Medicine

## 2022-06-11 ENCOUNTER — Ambulatory Visit (INDEPENDENT_AMBULATORY_CARE_PROVIDER_SITE_OTHER): Payer: Medicaid Other | Admitting: General Practice

## 2022-06-11 ENCOUNTER — Other Ambulatory Visit: Payer: Self-pay

## 2022-06-11 VITALS — BP 116/74 | HR 106 | Ht 65.0 in | Wt 166.0 lb

## 2022-06-11 DIAGNOSIS — N898 Other specified noninflammatory disorders of vagina: Secondary | ICD-10-CM | POA: Diagnosis not present

## 2022-06-11 NOTE — Progress Notes (Signed)
Patient presents to office today reporting green vaginal discharge with odor for past 2 days. Denies itching/irritation. She reports occasional cramps that feels like a period. She is still breastfeeding. Patient was instructed in self swab & specimen collected. Advised results will be back in 24-48 hours and available via mychart and any needed prescriptions will be sent to pharmacy. Patient verbalized understanding.   Koren Bound RN BSN 06/11/22

## 2022-06-12 LAB — CERVICOVAGINAL ANCILLARY ONLY
Bacterial Vaginitis (gardnerella): POSITIVE — AB
Candida Glabrata: NEGATIVE
Candida Vaginitis: NEGATIVE
Chlamydia: NEGATIVE
Comment: NEGATIVE
Comment: NEGATIVE
Comment: NEGATIVE
Comment: NEGATIVE
Comment: NEGATIVE
Comment: NORMAL
Neisseria Gonorrhea: NEGATIVE
Trichomonas: NEGATIVE

## 2022-06-16 ENCOUNTER — Other Ambulatory Visit: Payer: Self-pay | Admitting: Family Medicine

## 2022-06-16 DIAGNOSIS — B9689 Other specified bacterial agents as the cause of diseases classified elsewhere: Secondary | ICD-10-CM

## 2022-06-16 MED ORDER — METRONIDAZOLE 500 MG PO TABS: 500.0000 mg | ORAL_TABLET | Freq: Two times a day (BID) | ORAL | 0 refills | Status: DC

## 2022-07-04 DIAGNOSIS — Z419 Encounter for procedure for purposes other than remedying health state, unspecified: Secondary | ICD-10-CM | POA: Diagnosis not present

## 2022-07-17 ENCOUNTER — Encounter: Payer: Self-pay | Admitting: *Deleted

## 2022-08-04 DIAGNOSIS — Z419 Encounter for procedure for purposes other than remedying health state, unspecified: Secondary | ICD-10-CM | POA: Diagnosis not present

## 2022-09-04 DIAGNOSIS — Z419 Encounter for procedure for purposes other than remedying health state, unspecified: Secondary | ICD-10-CM | POA: Diagnosis not present

## 2022-10-03 DIAGNOSIS — Z419 Encounter for procedure for purposes other than remedying health state, unspecified: Secondary | ICD-10-CM | POA: Diagnosis not present

## 2022-10-06 ENCOUNTER — Ambulatory Visit (HOSPITAL_COMMUNITY)
Admission: EM | Admit: 2022-10-06 | Discharge: 2022-10-06 | Disposition: A | Discharge status: Home / Self Care | Payer: Medicaid Other | Attending: Emergency Medicine | Admitting: Emergency Medicine

## 2022-10-06 ENCOUNTER — Other Ambulatory Visit: Payer: Self-pay

## 2022-10-06 ENCOUNTER — Encounter (HOSPITAL_COMMUNITY): Payer: Self-pay | Admitting: *Deleted

## 2022-10-06 DIAGNOSIS — H1033 Unspecified acute conjunctivitis, bilateral: Secondary | ICD-10-CM | POA: Diagnosis not present

## 2022-10-06 MED ORDER — MOXIFLOXACIN HCL 0.5 % OP SOLN: 1.0000 [drp] | Freq: Three times a day (TID) | OPHTHALMIC | 0 refills | Status: DC

## 2022-10-06 NOTE — ED Triage Notes (Signed)
Pt reports he roldest girl has pink eye . Pt has had eye drainage and ey epain for 2 days.

## 2022-10-06 NOTE — Discharge Instructions (Addendum)
Use the eye drops 3x daily for the next 5-7 days  Please return with any concerns

## 2022-10-06 NOTE — ED Provider Notes (Signed)
[facility]    CSN: [id number] Arrival date & time: 10/06/22  0815      History   Chief Complaint Chief Complaint  Patient presents with   Eye Pain   Conjunctivitis    HPI Lisa Liu is a 27 y.o. female.  Here with 2 day history of bilateral eye irritation, redness, and drainage. No pain Mucous discharge in the morning with some matting No vision changes Does not wear contacts Daughter has similar symptoms  Past Medical History:  Diagnosis Date   Anemia    Fibroid    Fibroids    Gestational hypertension 05/12/2017   Insufficient weight gain during pregnancy 04/02/2017   Pregnancy induced hypertension     Patient Active Problem List   Diagnosis Date Noted   Intrahepatic cholestasis of pregnancy 04/12/2022   History of gestational hypertension 02/27/2022   Insufficient weight gain in pregnancy, second trimester 01/14/2022   Ovarian cyst during pregnancy in first trimester 10/21/2021   Fibroid uterus 07/18/2016    History reviewed. No pertinent surgical history.  OB History     Gravida  4   Para  2   Term  2   Preterm      AB  2   Living  2      SAB  2   IAB      Ectopic      Multiple  0   Live Births  2            Home Medications    Prior to Admission medications   Medication Sig Start Date End Date Taking? Authorizing Provider  moxifloxacin (VIGAMOX) 0.5 % ophthalmic solution Place 1 drop into both eyes 3 (three) times daily. 10/06/22  Yes Corinn Stoltzfus, Wells Guiles, PA-C  acetaminophen (TYLENOL) 325 MG tablet Take 325 mg by mouth every 6 (six) hours as needed.    [provider]  Prenatal Vit-Fe Fumarate-FA (MULTIVITAMIN-PRENATAL) 27-0.8 MG TABS tablet Take 1 tablet by mouth daily at 12 noon.    [provider]    Family History Family History  Problem Relation Age of Onset   Healthy Mother    Varicose Veins Father    Diabetes Paternal Grandmother    Hyperlipidemia Paternal Grandmother      Social History Social History   Tobacco Use   Smoking status: Never   Smokeless tobacco: Never  Vaping Use   Vaping Use: Never used  Substance Use Topics   Alcohol use: No   Drug use: No     Allergies   Patient has no known allergies.   Review of Systems Review of Systems As per HPI  Physical Exam Triage Vital Signs ED Triage Vitals [10/06/22 0913]  Enc Vitals Group     BP      Pulse      Resp      Temp      Temp src      SpO2      Weight      Height      Head Circumference      Peak Flow      Pain Score 7     Pain Loc      Pain Edu?      Excl. in [place]?    No data found.  Updated Vital Signs BP 106/74   Pulse 86   Temp 98 F (36.7 C)   Resp 18   LMP  (LMP Unknown)   SpO2 96%  Physical Exam Vitals and nursing note reviewed.  Constitutional:      General: She is not in acute distress. Eyes:     General: Lids are normal. Vision grossly intact. Gaze aligned appropriately.        Right eye: No discharge.        Left eye: No discharge.     Extraocular Movements: Extraocular movements intact.     Conjunctiva/sclera:     Right eye: Right conjunctiva is injected.     Left eye: Left conjunctiva is injected.  Cardiovascular:     Rate and Rhythm: Normal rate and regular rhythm.  Pulmonary:     Effort: Pulmonary effort is normal.  Neurological:     Mental Status: She is alert and oriented to person, place, and time.     UC Treatments / Results  Labs (all labs ordered are listed, but only abnormal results are displayed) Labs Reviewed - No data to display  EKG  Radiology No results found.  Procedures Procedures (including critical care time)  Medications Ordered in UC Medications - No data to display  Initial Impression / Assessment and Plan / UC Course  I have reviewed the triage vital signs and the nursing notes.  Pertinent labs & imaging results that were available during my care of the patient were reviewed by me and considered  in my medical decision making (see chart for details).  Acute bacterial conjunctivitis Vigamox 3x daily Discussed return precautions  Final Clinical Impressions(s) / UC Diagnoses   Final diagnoses:  Acute bacterial conjunctivitis of both eyes     Discharge Instructions      Use the eye drops 3x daily for the next 5-7 days  Please return with any concerns      ED Prescriptions     Medication Sig Dispense Auth. Provider   moxifloxacin (VIGAMOX) 0.5 % ophthalmic solution Place 1 drop into both eyes 3 (three) times daily. 3 mL Karimah Winquist, Wells Guiles, PA-C      PDMP not reviewed this encounter.   Les Pou, [place] 10/06/22 [id number]

## 2022-10-17 ENCOUNTER — Telehealth: Payer: Self-pay

## 2022-10-17 NOTE — Telephone Encounter (Signed)
Called Pt to advised that we are trying to schedule our [place] & which dates were better for her & she stated either date was fine, advised we will get back with her with the actual date.

## 2022-10-19 ENCOUNTER — Encounter (HOSPITAL_COMMUNITY): Payer: Self-pay

## 2022-10-19 ENCOUNTER — Ambulatory Visit (HOSPITAL_COMMUNITY)
Admission: RE | Admit: 2022-10-19 | Discharge: 2022-10-19 | Disposition: A | Discharge status: Home / Self Care | Payer: Medicaid Other | Source: Ambulatory Visit | Attending: Internal Medicine | Admitting: Internal Medicine

## 2022-10-19 VITALS — BP 111/70 | HR 91 | Temp 98.3°F | Resp 16

## 2022-10-19 DIAGNOSIS — J029 Acute pharyngitis, unspecified: Secondary | ICD-10-CM | POA: Diagnosis not present

## 2022-10-19 DIAGNOSIS — Z3202 Encounter for pregnancy test, result negative: Secondary | ICD-10-CM | POA: Diagnosis not present

## 2022-10-19 DIAGNOSIS — J302 Other seasonal allergic rhinitis: Secondary | ICD-10-CM | POA: Diagnosis not present

## 2022-10-19 LAB — POC URINE PREG, ED: Preg Test, Ur: NEGATIVE

## 2022-10-19 LAB — POCT RAPID STREP A, ED / UC: Streptococcus, Group A Screen (Direct): NEGATIVE

## 2022-10-19 MED ORDER — BENZONATATE 100 MG PO CAPS: 100.0000 mg | ORAL_CAPSULE | Freq: Three times a day (TID) | ORAL | 0 refills | Status: DC

## 2022-10-19 NOTE — Discharge Instructions (Signed)
Your strep test is negative.  Please continue taking ibuprofen as needed for sore throat.  Take Zyrtec once daily to help dry up drainage to the back of the throat contributing to sore throat.  You may take Tessalon Perles as needed every 8 hours for cough.  Your symptoms are likely due to spring allergies.  Your pregnancy test is negative.   If you develop any new or worsening symptoms or do not improve in the next 2 to 3 days, please return.  If your symptoms are severe, please go to the emergency room.  Follow-up with your primary care provider for further evaluation and management of your symptoms as well as ongoing wellness visits.  I hope you feel better!

## 2022-10-19 NOTE — ED Triage Notes (Signed)
Patient reports a non productive cough and a sore throat x 2 weeks.  Patient states she has been taking Ibuprofen and the last dose was yesterday.

## 2022-10-19 NOTE — ED Provider Notes (Signed)
[place]    CSN: [id number] Arrival date & time: 10/19/22  1621      History   Chief Complaint Chief Complaint  Patient presents with   Sore Throat    Sore throat and cough for 2 weeks now - Entered by patient   Cough    HPI Lisa Liu is a 27 y.o. female.   Patient presents to urgent care for evaluation of cough and sore throat that has been intermittent over the last 2 weeks.  She states her daughter had similar symptoms and tested negative for strep throat.  Her sore throat improved significantly after a couple of days with ibuprofen use.  Patient sore throat has improved and returned multiple times in the last couple of weeks.  History of seasonal allergies.  No history of chronic respiratory problems.  Cough is dry, nonproductive, and worse at nighttime.  Denies nasal congestion, headache, ear pain, fever/chills, decreased appetite, neck pain, and dizziness.  She has been using ibuprofen to help with throat pain and states that this relieves the pain temporarily but the pain comes back when the medication wears off.  She has not had any nausea, vomiting, abdominal pain, rash, or diarrhea.  Unsure of pregnancy status as she has been breast-feeding her baby and has not had a menstrual cycle since she gave birth 6 months ago.    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 No pertinent surgical history.  OB History     Gravida  4   Para   2   Term  2   Preterm      AB  2   Living  2      SAB  2   IAB      Ectopic      Multiple  0   Live Births  2            Home Medications    Prior to Admission medications   Medication Sig Start Date End Date Taking? Authorizing Provider  benzonatate (TESSALON) 100 MG capsule Take 1 capsule (100 mg total) by mouth every 8 (eight) hours. 10/19/22  Yes Talbot Grumbling, FNP  acetaminophen (TYLENOL) 325 MG tablet Take 325 mg by mouth every 6 (six) hours as needed.    [provider]  moxifloxacin (VIGAMOX) 0.5 % ophthalmic solution Place 1 drop into both eyes 3 (three) times daily. 10/06/22   Rising, Wells Guiles, PA-C  Prenatal Vit-Fe Fumarate-FA (MULTIVITAMIN-PRENATAL) 27-0.8 MG TABS tablet Take 1 tablet by mouth daily at 12 noon.    [provider]    Family History Family History  Problem Relation Age of Onset   Healthy Mother    Varicose Veins Father    Diabetes Paternal Grandmother    Hyperlipidemia Paternal Grandmother     Social History Social History   Tobacco Use   Smoking status: Never   Smokeless tobacco: Never  Vaping Use   Vaping Use: Never  used  Substance Use Topics   Alcohol use: No   Drug use: No     Allergies   Patient has no known allergies.   Review of Systems Review of Systems  Respiratory:  Positive for cough.   Per HPI   Physical Exam Triage Vital Signs ED Triage Vitals  Enc Vitals Group     BP 10/19/22 1640 111/70     Pulse Rate 10/19/22 1640 91     Resp 10/19/22 1640 16     Temp 10/19/22 1640 98.3 F (36.8 C)     Temp Source 10/19/22 1640 Oral     SpO2 10/19/22 1640 97 %     Weight --      Height --      Head Circumference --      Peak Flow --      Pain Score 10/19/22 1642 8     Pain Loc --      Pain Edu? --      Excl. in [place]? --    No data found.  Updated Vital Signs BP 111/70 (BP Location: Right Arm)   Pulse 91   Temp 98.3 F (36.8 C) (Oral)   Resp 16   LMP  (LMP Unknown)   SpO2  97%   Breastfeeding Yes   Visual Acuity Right Eye Distance:   Left Eye Distance:   Bilateral Distance:    Right Eye Near:   Left Eye Near:    Bilateral Near:     Physical Exam Vitals and nursing note reviewed.  Constitutional:      Appearance: Normal appearance. She is not ill-appearing or toxic-appearing.  HENT:     Head: Normocephalic and atraumatic.     Right Ear: Hearing, tympanic membrane, ear canal and external ear normal.     Left Ear: Hearing, tympanic membrane, ear canal and external ear normal.     Nose: Nose normal.     Mouth/Throat:     Lips: Pink.     Mouth: Mucous membranes are moist. No injury.     Tongue: No lesions. Tongue does not deviate from midline.     Palate: No mass and lesions.     Pharynx: Oropharynx is clear. Uvula midline. Posterior oropharyngeal erythema present. No pharyngeal swelling, oropharyngeal exudate or uvula swelling.     Tonsils: No tonsillar exudate or tonsillar abscesses.     Comments: Small amount of clear postnasal drainage visualized to the posterior oropharynx.  Eyes:     General: Lids are normal. Vision grossly intact. Gaze aligned appropriately.     Extraocular Movements: Extraocular movements intact.     Conjunctiva/sclera: Conjunctivae normal.  Cardiovascular:     Rate and Rhythm: Normal rate and regular rhythm.     Heart sounds: Normal heart sounds, S1 normal and S2 normal.  Pulmonary:     Effort: Pulmonary effort is normal. No respiratory distress.     Breath sounds: Normal breath sounds and air entry. No wheezing, rhonchi or rales.  Musculoskeletal:     Cervical back: Neck supple.  Lymphadenopathy:     Cervical: No cervical adenopathy.  Skin:    General: Skin is warm and dry.     Capillary Refill: Capillary refill takes less than 2 seconds.     Findings: No rash.  Neurological:     General: No focal deficit present.     Mental Status: She is alert and oriented to person, place, and time. Mental status is at baseline.  Cranial Nerves: No dysarthria or facial asymmetry.  Psychiatric:        Mood and Affect: Mood normal.        Speech: Speech normal.        Behavior: Behavior normal.        Thought Content: Thought content normal.        Judgment: Judgment normal.      UC Treatments / Results  Labs (all labs ordered are listed, but only abnormal results are displayed) Labs Reviewed  POC URINE PREG, ED  POCT RAPID STREP A, ED / UC    EKG   Radiology No results found.  Procedures Procedures (including critical care time)  Medications Ordered in UC Medications - No data to display  Initial Impression / Assessment and Plan / UC Course  I have reviewed the triage vital signs and the nursing notes.  Pertinent labs & imaging results that were available during my care of the patient were reviewed by me and considered in my medical decision making (see chart for details).   1. Viral pharyngitis, seasonal allergic rhinitis, negative pregnancy test Group A strep testing is negative, throat culture pending. Continue use of OTC medications and non pharmacologic remedies for sore throat like warm water/tea with honey and salt water gargles. May take zyrtec to dry up post-nasal drainage contributing to sore throat. Symptoms may be due to allergic rhinitis as well, zyrtec will help. Tessalon perles every 8 hours as needed for cough. Ibuprofen/tylenol as needed for sore throat. Pregnancy test performed due to amenorrhea and is negative.   Discussed physical exam and available lab work findings in clinic with patient.  Counseled patient regarding appropriate use of medications and potential side effects for all medications recommended or prescribed today. Discussed red flag signs and symptoms of worsening condition,when to call the PCP office, return to urgent care, and when to seek higher level of care in the emergency department. Patient verbalizes understanding and agreement with plan. All questions  answered. Patient discharged in stable condition.    Final Clinical Impressions(s) / UC Diagnoses   Final diagnoses:  Viral pharyngitis  Seasonal allergic rhinitis, unspecified trigger  Negative pregnancy test     Discharge Instructions      Your strep test is negative.  Please continue taking ibuprofen as needed for sore throat.  Take Zyrtec once daily to help dry up drainage to the back of the throat contributing to sore throat.  You may take Tessalon Perles as needed every 8 hours for cough.  Your symptoms are likely due to spring allergies.  Your pregnancy test is negative.   If you develop any new or worsening symptoms or do not improve in the next 2 to 3 days, please return.  If your symptoms are severe, please go to the emergency room.  Follow-up with your primary care provider for further evaluation and management of your symptoms as well as ongoing wellness visits.  I hope you feel better!     ED Prescriptions     Medication Sig Dispense Auth. Provider   benzonatate (TESSALON) 100 MG capsule Take 1 capsule (100 mg total) by mouth every 8 (eight) hours. 21 capsule Talbot Grumbling, FNP      PDMP not reviewed this encounter.   Talbot Grumbling, [place] 10/20/22 1645

## 2022-10-28 ENCOUNTER — Telehealth: Payer: Self-pay

## 2022-10-28 NOTE — Telephone Encounter (Signed)
Contacted Pt to advise that our [facility]  for [facility] is scheduled for 11/14/22 @ 12p. Pt verbalized understanding.

## 2022-11-03 DIAGNOSIS — Z419 Encounter for procedure for purposes other than remedying health state, unspecified: Secondary | ICD-10-CM | POA: Diagnosis not present

## 2022-11-05 ENCOUNTER — Encounter: Payer: Self-pay | Admitting: Radiology

## 2022-11-05 ENCOUNTER — Ambulatory Visit (INDEPENDENT_AMBULATORY_CARE_PROVIDER_SITE_OTHER): Payer: Medicaid Other | Admitting: Radiology

## 2022-11-05 VITALS — BP 110/74 | Ht 64.0 in | Wt 195.0 lb

## 2022-11-05 DIAGNOSIS — N898 Other specified noninflammatory disorders of vagina: Secondary | ICD-10-CM | POA: Diagnosis not present

## 2022-11-05 NOTE — Progress Notes (Signed)
   Lisa Liu [birth year] [id number]   History:  27 y.o. G4P2 presents with concern regarding a vaginal mass she felt 2 months ago after sitting on the toilet for a long period of time. Since then she no longer feels it. Denies any urinary or vaginal symptoms.   Gynecologic History No LMP recorded (lmp unknown). (Menstrual status: Lactating). Period Cycle (Days):  (currently breast feeding)   Obstetric History OB History  Gravida Para Term Preterm AB Living  4 2 2   2 2   SAB IAB Ectopic Multiple Live Births  2     0 2    # Outcome Date GA Lbr Len/2nd Weight Sex Delivery Anes PTL Lv  4 Term 04/12/22 [redacted]w[redacted]d / 00:06 7 lb 14 oz (3.572 kg) M Vag-Spont EPI  LIV  3 SAB 07/04/21          2 Term 05/12/17 [redacted]w[redacted]d / 01:15 6 lb 11.1 oz (3.036 kg) F Vag-Spont EPI  LIV     Birth Comments: wnl  1 SAB 07/2016 [redacted]w[redacted]d            The following portions of the patient's history were reviewed and updated as appropriate: allergies, current medications, past family history, past medical history, past social history, past surgical history, and problem list.  Review of Systems Pertinent items noted in HPI and remainder of comprehensive ROS otherwise negative.   Past medical history, past surgical history, family history and social history were all reviewed and documented in the EPIC chart.   Exam:  Vitals:   11/05/22 1202  BP: 110/74  Weight: 195 lb (88.5 kg)  Height: 5\' 4"  (1.626 m)   Body mass index is 33.47 kg/m.  General appearance:  Normal Abdominal  Soft,nontender, without masses, guarding or rebound.  Liver/spleen:  No organomegaly noted  Hernia:  None appreciated  Skin  Inspection:  Grossly normal Genitourinary   Inguinal/mons:  Normal without inguinal adenopathy  External genitalia:  Normal appearing vulva with no masses, tenderness, or lesions  BUS/Urethra/Skene's glands:  Normal without masses or exudate  Vagina:  Normal appearing with normal color and discharge, no  lesions  Cervix:  Normal appearing without discharge or lesions  Uterus:  Enlarged to 8 week size fibroid uterus. Mobile, nontender  Adnexa/parametria:     Rt: Normal in size, without masses or tenderness.   Lt: Normal in size, without masses or tenderness.  Anus and perineum: Normal   Leatrice Jewels, CMA present for exam  Assessment/Plan:   1. Vaginal mass Reassured no prolapse or mass currently Likely from sitting too long on the toilet Continue to monitor and return if it returns for further evaluation   Rozetta Stumpp B WHNP-BC 12:15 PM 11/05/2022

## 2022-12-03 DIAGNOSIS — Z419 Encounter for procedure for purposes other than remedying health state, unspecified: Secondary | ICD-10-CM | POA: Diagnosis not present

## 2023-01-03 DIAGNOSIS — Z419 Encounter for procedure for purposes other than remedying health state, unspecified: Secondary | ICD-10-CM | POA: Diagnosis not present

## 2023-02-02 DIAGNOSIS — Z419 Encounter for procedure for purposes other than remedying health state, unspecified: Secondary | ICD-10-CM | POA: Diagnosis not present

## 2023-03-05 DIAGNOSIS — Z419 Encounter for procedure for purposes other than remedying health state, unspecified: Secondary | ICD-10-CM | POA: Diagnosis not present

## 2023-03-10 ENCOUNTER — Ambulatory Visit (HOSPITAL_COMMUNITY)
Admission: RE | Admit: 2023-03-10 | Discharge: 2023-03-10 | Disposition: A | Discharge status: Home / Self Care | Payer: Medicaid Other | Source: Ambulatory Visit | Attending: Physician Assistant | Admitting: Physician Assistant

## 2023-03-10 ENCOUNTER — Encounter (HOSPITAL_COMMUNITY): Payer: Self-pay

## 2023-03-10 VITALS — BP 103/71 | HR 94 | Temp 98.1°F | Resp 16

## 2023-03-10 DIAGNOSIS — N898 Other specified noninflammatory disorders of vagina: Secondary | ICD-10-CM | POA: Insufficient documentation

## 2023-03-10 DIAGNOSIS — N3001 Acute cystitis with hematuria: Secondary | ICD-10-CM | POA: Insufficient documentation

## 2023-03-10 LAB — POCT URINALYSIS DIP (MANUAL ENTRY)
Bilirubin, UA: NEGATIVE
Glucose, UA: NEGATIVE mg/dL
Ketones, POC UA: NEGATIVE mg/dL
Nitrite, UA: POSITIVE — AB
Protein Ur, POC: NEGATIVE mg/dL
Spec Grav, UA: 1.025 (ref 1.010–1.025)
Urobilinogen, UA: 0.2 E.U./dL
pH, UA: 5.5 (ref 5.0–8.0)

## 2023-03-10 MED ORDER — SULFAMETHOXAZOLE-TRIMETHOPRIM 800-160 MG PO TABS
1.0000 | ORAL_TABLET | Freq: Two times a day (BID) | ORAL | 0 refills | Status: AC
Start: 2023-03-10 — End: 2023-03-13

## 2023-03-10 MED ORDER — FLUCONAZOLE 150 MG PO TABS
150.0000 mg | ORAL_TABLET | ORAL | 0 refills | Status: AC
Start: 2023-03-10 — End: 2023-03-18

## 2023-03-10 NOTE — ED Provider Notes (Signed)
[facility]    CSN: [id number] Arrival date & time: 03/10/23  1222      History   Chief Complaint Chief Complaint  Patient presents with   Vaginal Itching    HPI Lisa Liu is a 27 y.o. female.   Patient presents today with 3-day history of vaginal irritation described as pruritus.  Denies any significant discharge, pelvic pain, abdominal pain, fever, nausea, vomiting.  She does report some urinary frequency but reports that this is chronic.  Denies any dysuria, urgency, hematuria.  She denies history of diabetes or HIV.  She does not take SGLT2 inhibitor.  She denies any recent antibiotics or changes to personal hygiene products including soaps or detergents.  Has tried over-the-counter vaginocele without improvement of symptoms.  She has no specific concern for STI but is open to testing.  She is confident that she is not pregnant.    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 No pertinent surgical history.  OB History     Gravida  4   Para  2   Term  2   Preterm      AB  2   Living  2      SAB  2   IAB      Ectopic      Multiple  0   Live Births  2            Home Medications    Prior to Admission medications   Medication Sig Start Date End Date Taking? Authorizing Provider  fluconazole (DIFLUCAN) 150 MG tablet Take 1 tablet (150 mg total) by mouth once a week for 2 doses. 03/10/23 03/18/23 Yes Tovah Slavick K, PA-C   sulfamethoxazole-trimethoprim (BACTRIM DS) 800-160 MG tablet Take 1 tablet by mouth 2 (two) times daily for 3 days. 03/10/23 03/13/23 Yes Kahmari Koller, Noberto Retort, PA-C    Family History Family History  Problem Relation Age of Onset   Healthy Mother    Varicose Veins Father    Diabetes Paternal Grandmother    Hyperlipidemia Paternal Grandmother     Social History Social History   Tobacco Use   Smoking status: Never    Passive exposure: Never   Smokeless tobacco: Never  Vaping Use   Vaping status: Never Used  Substance Use Topics   Alcohol use: No   Drug use: No     Allergies   Patient has no known allergies.   Review of Systems Review of Systems  Constitutional:  Positive for activity change. Negative for appetite change, fatigue and fever.  Gastrointestinal:  Negative for abdominal pain, diarrhea, nausea and vomiting.  Genitourinary:  Positive for frequency and vaginal pain (itching and irritation). Negative for dysuria, pelvic pain, urgency, vaginal bleeding and vaginal discharge.     Physical Exam Triage Vital Signs ED Triage Vitals [03/10/23 1251]  Encounter Vitals Group     BP 103/71     Systolic BP Percentile      Diastolic BP Percentile  Pulse Rate 94     Resp 16     Temp 98.1 F (36.7 C)     Temp Source Oral     SpO2 98 %     Weight      Height      Head Circumference      Peak Flow      Pain Score      Pain Loc      Pain Education      Exclude from Growth Chart    No data found.  Updated Vital Signs BP 103/71 (BP Location: Left Arm)   Pulse 94   Temp 98.1 F (36.7 C) (Oral)   Resp 16   LMP 02/14/2023   SpO2 98%   Breastfeeding No   Visual Acuity Right Eye Distance:   Left Eye Distance:   Bilateral Distance:    Right Eye Near:   Left Eye Near:    Bilateral Near:     Physical Exam Vitals reviewed.  Constitutional:      General: She is awake. She is not in acute distress.    Appearance: Normal appearance. She is well-developed. She  is not ill-appearing.     Comments: Very pleasant female appears stated age in no acute distress sitting comfortably in exam room  HENT:     Head: Normocephalic and atraumatic.  Cardiovascular:     Rate and Rhythm: Normal rate and regular rhythm.     Heart sounds: Normal heart sounds, S1 normal and S2 normal. No murmur heard. Pulmonary:     Effort: Pulmonary effort is normal.     Breath sounds: Normal breath sounds. No wheezing, rhonchi or rales.     Comments: Clear to auscultation bilaterally Abdominal:     General: Bowel sounds are normal.     Palpations: Abdomen is soft.     Tenderness: There is no abdominal tenderness. There is no right CVA tenderness, left CVA tenderness, guarding or rebound.     Comments: Benign abdominal exam  Genitourinary:    Comments: Exam deferred Psychiatric:        Behavior: Behavior is cooperative.      UC Treatments / Results  Labs (all labs ordered are listed, but only abnormal results are displayed) Labs Reviewed  POCT URINALYSIS DIP (MANUAL ENTRY) - Abnormal; Notable for the following components:      Result Value   Blood, UA trace-intact (*)    Nitrite, UA Positive (*)    Leukocytes, UA Small (1+) (*)    All other components within normal limits  URINE CULTURE  CERVICOVAGINAL ANCILLARY ONLY    EKG   Radiology No results found.  Procedures Procedures (including critical care time)  Medications Ordered in UC Medications - No data to display  Initial Impression / Assessment and Plan / UC Course  I have reviewed the triage vital signs and the nursing notes.  Pertinent labs & imaging results that were available during my care of the patient were reviewed by me and considered in my medical decision making (see chart for details).     Patient is well-appearing, afebrile, nontoxic, nontachycardic.  Vital signs and physical exam are reassuring with no indication for emergent evaluation or imaging.  She does report some urinary  frequency and so UA was obtained that was consistent with a urinary tract infection.  Will treat with Bactrim DS twice daily for 3 days.  We discussed that if she develops any rash or lesions she should stop the medication to  be seen immediately.  Will send this for culture and contact her if we need to discontinue or change her antibiotics based on susceptibilities identified on culture.  She is to rest and drink plenty of fluid.  I am also concerned for a vaginal yeast infection given her associated symptoms.  Will treat with Diflucan 1 dose today and a second in a week as we discussed that antibiotics can trigger yeast infections.  She is to wear loosefitting cotton underwear and use hypoallergenic soaps and detergents.  STI swab was collected and is pending.  We will contact her if we need to arrange additional treatment based on these results.  Discussed that if she develops any abdominal pain, pelvic pain, fever, nausea, vomiting she needs to be seen immediately.  Strict return precautions given.  All questions were answered to patient satisfaction.  Final Clinical Impressions(s) / UC Diagnoses   Final diagnoses:  Acute cystitis with hematuria  Vaginal irritation     Discharge Instructions      Your urine did have evidence of an infection.  We will start Bactrim DS for 3 days.  If you develop any rash or oral lesions stop the medication to be seen immediately.  Make sure that you are drinking plenty of fluid.  I have also called in 2 doses of Diflucan.  You can take 1 today and 1 in 1 week.  We will contact you if we need to arrange additional treatment based on your swab result.  Wear loosefitting cotton underwear and use hypoallergenic soaps and detergents.  If you develop any additional symptoms including abdominal pain, pelvic pain, fever, nausea, vomiting you need to be seen immediately.     ED Prescriptions     Medication Sig Dispense Auth. Provider   sulfamethoxazole-trimethoprim  (BACTRIM DS) 800-160 MG tablet Take 1 tablet by mouth 2 (two) times daily for 3 days. 6 tablet Keilan Nichol K, PA-C   fluconazole (DIFLUCAN) 150 MG tablet Take 1 tablet (150 mg total) by mouth once a week for 2 doses. 2 tablet Estuardo Frisbee, Noberto Retort, PA-C      PDMP not reviewed this encounter.   Jeani Hawking, PA-C 03/10/23 1328

## 2023-03-10 NOTE — ED Triage Notes (Signed)
Here for vaginal itching x 3 days. Pt denies any abnormal vaginal discharge.

## 2023-03-10 NOTE — Discharge Instructions (Signed)
Your urine did have evidence of an infection.  We will start Bactrim DS for 3 days.  If you develop any rash or oral lesions stop the medication to be seen immediately.  Make sure that you are drinking plenty of fluid.  I have also called in 2 doses of Diflucan.  You can take 1 today and 1 in 1 week.  We will contact you if we need to arrange additional treatment based on your swab result.  Wear loosefitting cotton underwear and use hypoallergenic soaps and detergents.  If you develop any additional symptoms including abdominal pain, pelvic pain, fever, nausea, vomiting you need to be seen immediately.

## 2023-03-12 ENCOUNTER — Telehealth: Payer: Self-pay

## 2023-03-12 MED ORDER — METRONIDAZOLE 500 MG PO TABS: 500.0000 mg | ORAL_TABLET | Freq: Two times a day (BID) | ORAL | 0 refills | Status: AC

## 2023-03-12 NOTE — Telephone Encounter (Signed)
Per protocol, pt requires tx with metronidazole. Attempted to reach patient x1. LVM. Rx sent to pharmacy on file.

## 2023-04-05 DIAGNOSIS — Z419 Encounter for procedure for purposes other than remedying health state, unspecified: Secondary | ICD-10-CM | POA: Diagnosis not present

## 2023-04-22 ENCOUNTER — Other Ambulatory Visit: Payer: Self-pay

## 2023-04-22 ENCOUNTER — Other Ambulatory Visit (HOSPITAL_COMMUNITY)
Admission: RE | Admit: 2023-04-22 | Discharge: 2023-04-22 | Disposition: A | Discharge status: Home / Self Care | Payer: Medicaid Other | Source: Ambulatory Visit | Attending: Family Medicine | Admitting: Family Medicine

## 2023-04-22 ENCOUNTER — Ambulatory Visit (INDEPENDENT_AMBULATORY_CARE_PROVIDER_SITE_OTHER): Payer: Medicaid Other

## 2023-04-22 VITALS — BP 121/74 | HR 84 | Ht 65.0 in | Wt 190.2 lb

## 2023-04-22 DIAGNOSIS — N898 Other specified noninflammatory disorders of vagina: Secondary | ICD-10-CM

## 2023-04-22 NOTE — Progress Notes (Signed)
Here for self swab for c/o vaginal itching. Denies unusual discharge. Self swab obtained for wet prep. Explained will be contacted if anything positive and needs treatment. She voices understanding. Lisa Liu

## 2023-04-24 ENCOUNTER — Other Ambulatory Visit: Payer: Self-pay | Admitting: Obstetrics and Gynecology

## 2023-04-24 DIAGNOSIS — B3731 Acute candidiasis of vulva and vagina: Secondary | ICD-10-CM

## 2023-04-24 DIAGNOSIS — B9689 Other specified bacterial agents as the cause of diseases classified elsewhere: Secondary | ICD-10-CM

## 2023-04-24 LAB — CERVICOVAGINAL ANCILLARY ONLY
Bacterial Vaginitis (gardnerella): POSITIVE — AB
Candida Glabrata: NEGATIVE
Candida Vaginitis: POSITIVE — AB
Chlamydia: NEGATIVE
Comment: NEGATIVE
Comment: NEGATIVE
Comment: NEGATIVE
Comment: NEGATIVE
Comment: NEGATIVE
Comment: NORMAL
Neisseria Gonorrhea: NEGATIVE
Trichomonas: NEGATIVE

## 2023-04-24 MED ORDER — METRONIDAZOLE 500 MG PO TABS
500.0000 mg | ORAL_TABLET | Freq: Two times a day (BID) | ORAL | 0 refills | Status: AC
Start: 2023-04-24 — End: 2023-05-01

## 2023-04-24 MED ORDER — FLUCONAZOLE 150 MG PO TABS
150.0000 mg | ORAL_TABLET | Freq: Once | ORAL | 1 refills | Status: AC
Start: 2023-04-24 — End: 2023-04-24

## 2023-04-27 ENCOUNTER — Telehealth: Payer: Self-pay

## 2023-04-27 NOTE — Telephone Encounter (Signed)
Pt returned call and I informed her of results and tx.  Pt advised to complete the seven day course then take Diflucan.  Pt verbalized understanding with no further questions.   Leonette Nutting  04/27/23

## 2023-04-27 NOTE — Telephone Encounter (Addendum)
-----   Message from [place] sent at 04/24/2023  5:36 PM EDT ----- Notify that vaginal swab shows BV and yeast  LM requesting to call back in regards to results.   Leonette Nutting  04/27/23

## 2023-05-05 DIAGNOSIS — Z419 Encounter for procedure for purposes other than remedying health state, unspecified: Secondary | ICD-10-CM | POA: Diagnosis not present

## 2023-06-05 DIAGNOSIS — Z419 Encounter for procedure for purposes other than remedying health state, unspecified: Secondary | ICD-10-CM | POA: Diagnosis not present

## 2023-06-19 IMAGING — US US OB COMP LESS 14 WK
1 series · 14 of 28 positions shown · non-contrast
Comparison: None.

CLINICAL DATA: 24-year-old female with positive urine pregnancy
test. Unknown last menstrual.

EXAM:
OBSTETRIC <14 WK US AND TRANSVAGINAL OB US
TECHNIQUE: Both transabdominal and transvaginal ultrasound examinations were
performed for complete evaluation of the gestation as well as the
maternal uterus, adnexal regions, and pelvic cul-de-sac.
Transvaginal technique was performed to assess early pregnancy.

[Series 1: us ob comp less 14 wk · 14 of 65 slices shown]
[im 3/65]
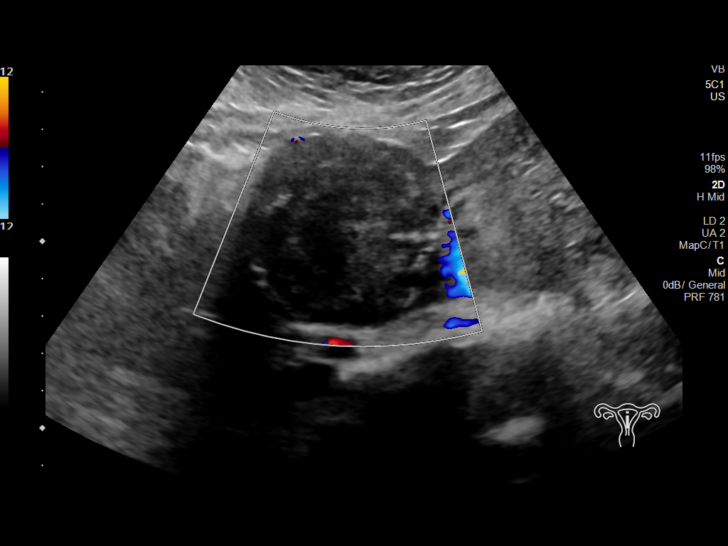
[im 8/65]
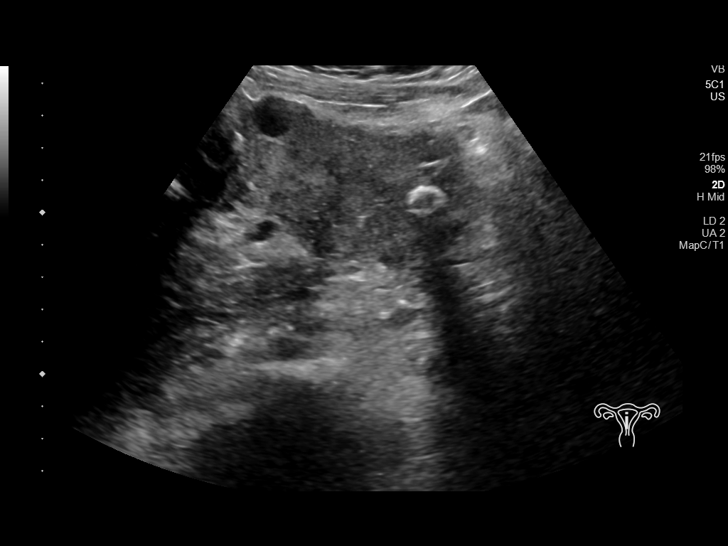
[im 12/65]
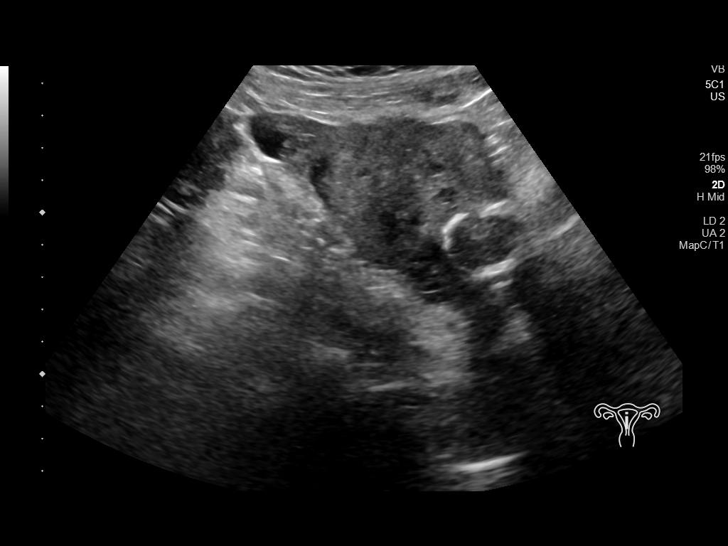
[im 17/65]
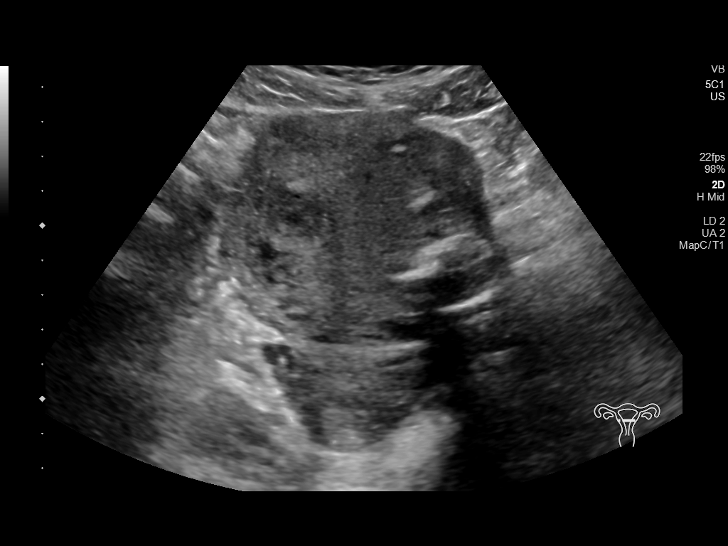
[im 22/65]
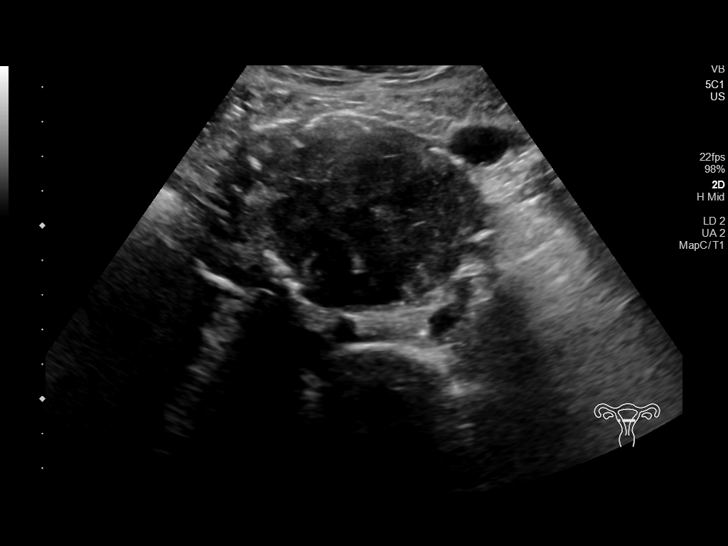
[im 27/65]
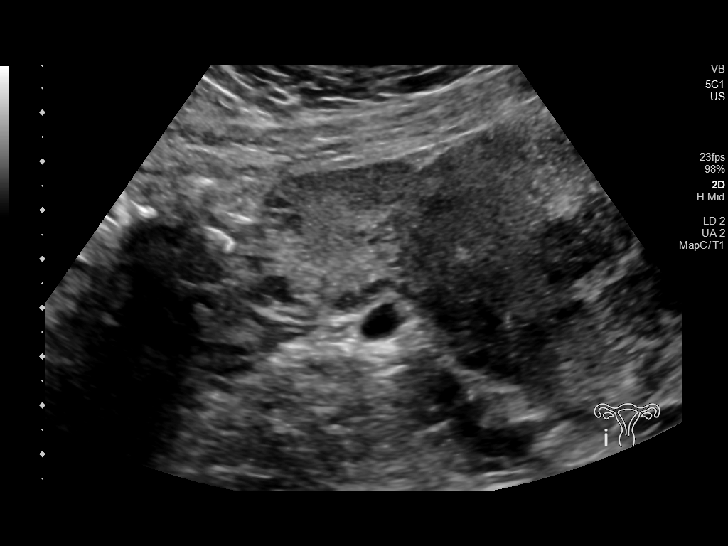
[im 31/65]
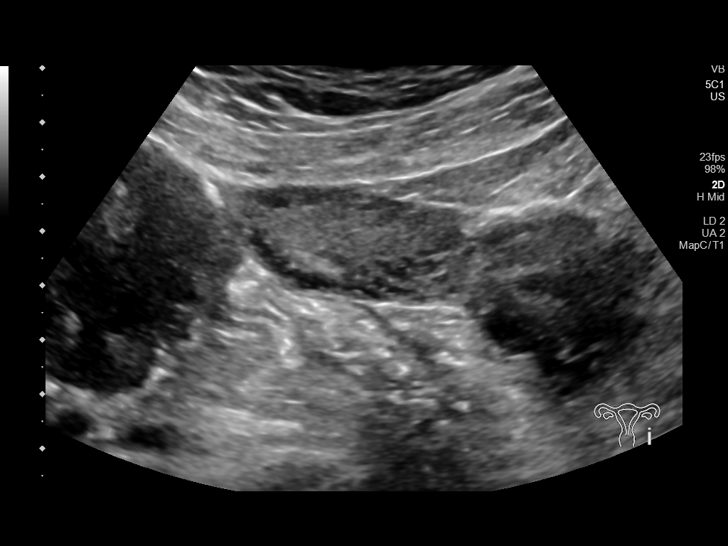
[im 36/65]
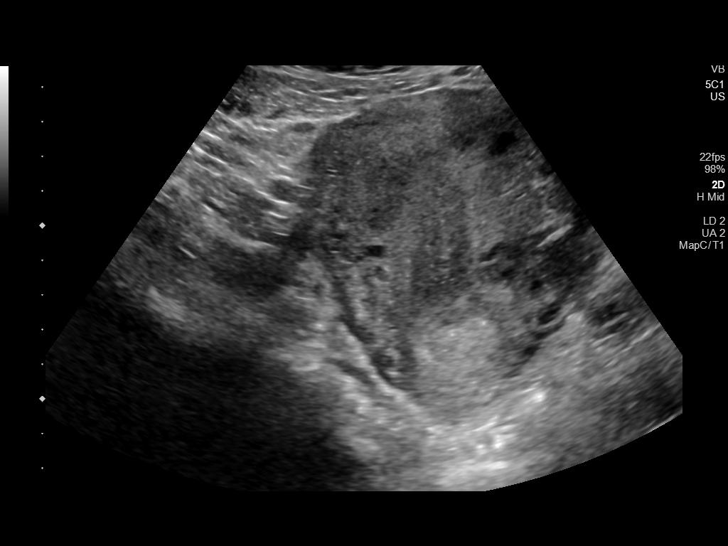
[im 41/65]
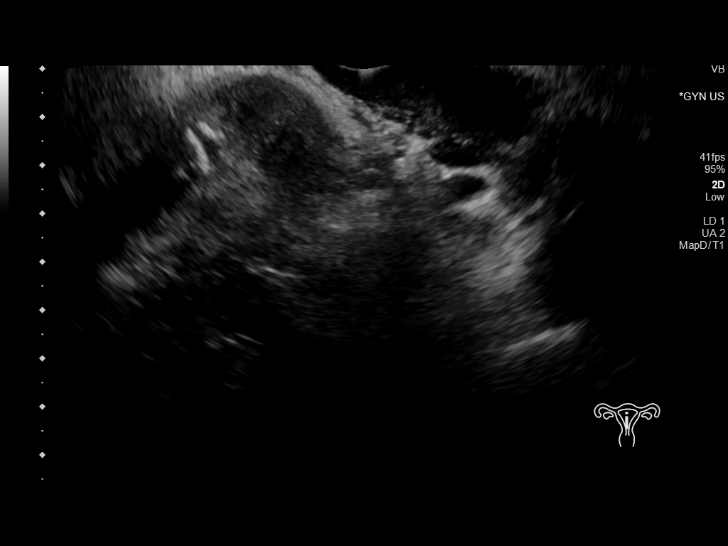
[im 46/65]
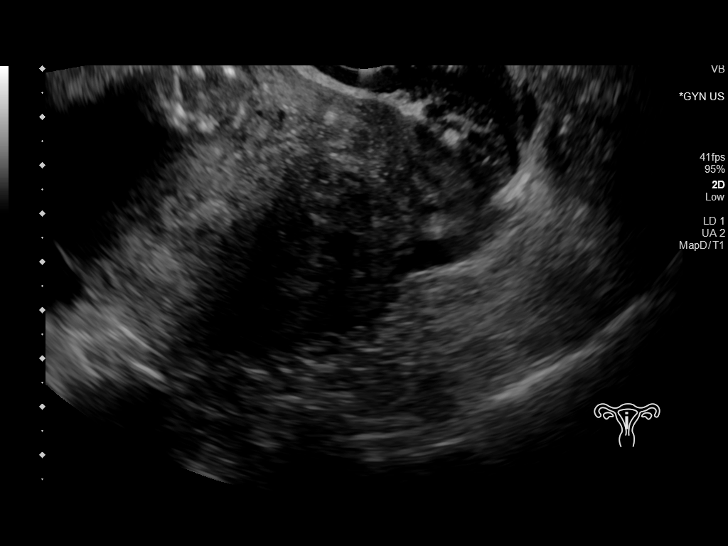
[im 50/65]
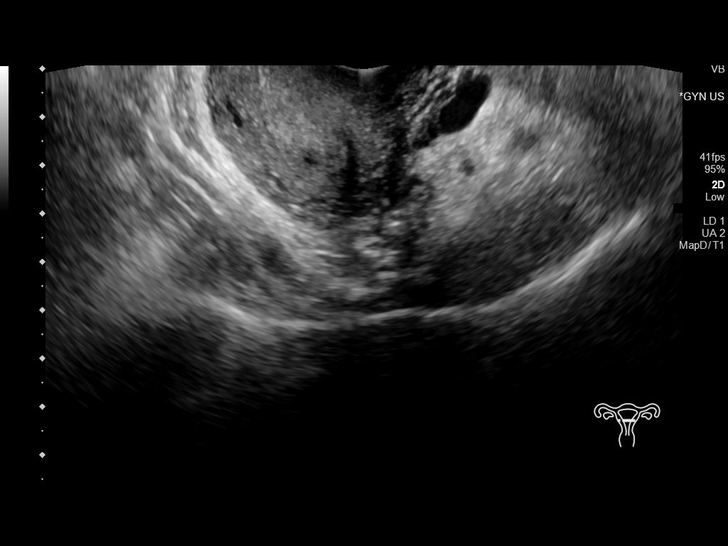
[im 55/65]
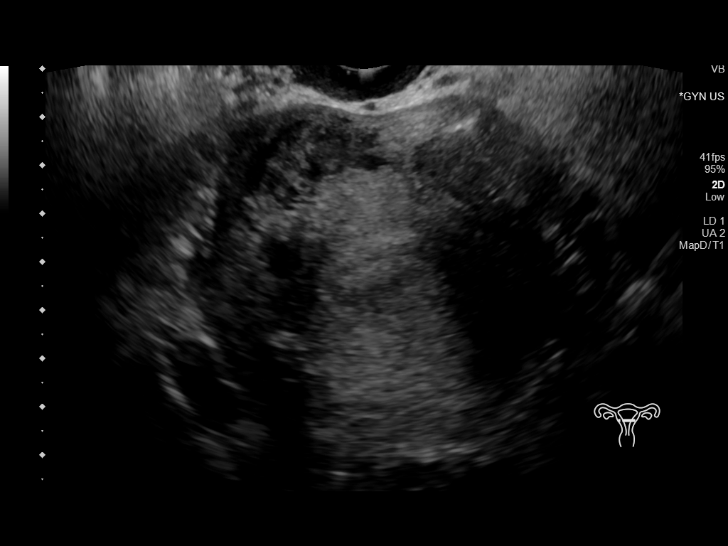
[im 60/65]
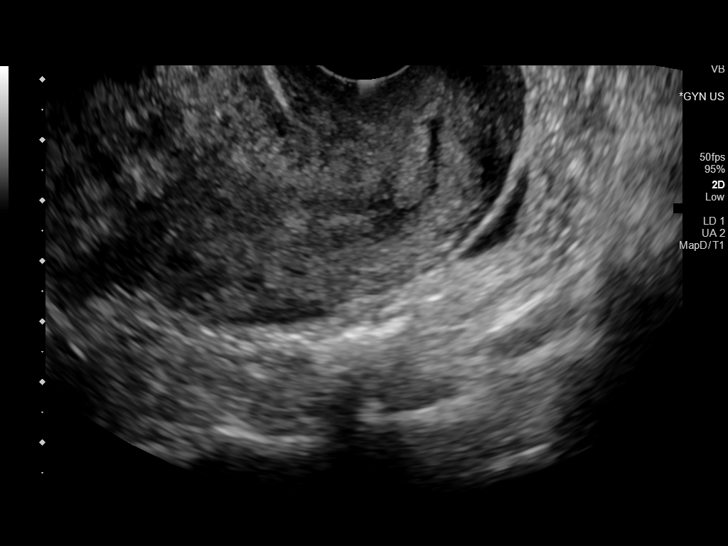
[im 65/65]
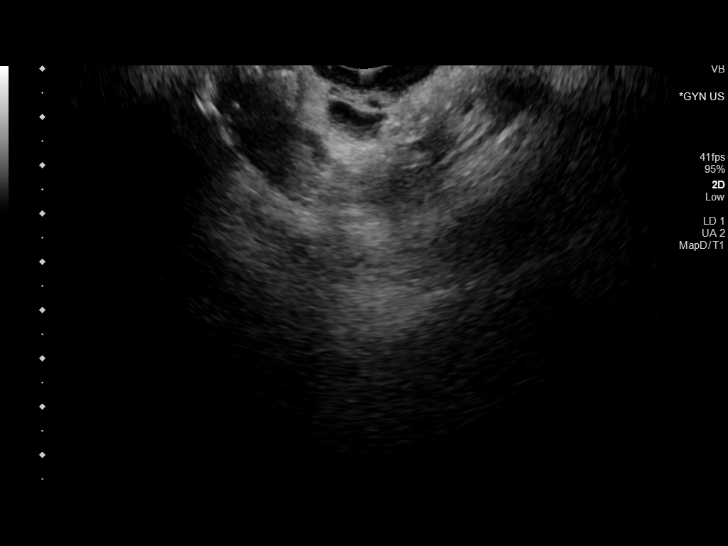

[14 of 28 positions shown; findings below may reference images not displayed]

FINDINGS: Intrauterine gestational sac: Not identified

Yolk sac:  Not identified

Embryo:  Not identified

Subchorionic hemorrhage:  None visualized.

Maternal uterus/adnexae: Normal sized ovaries. Multiple calcified
leiomyoma within the uterine body and serosal surface.

Trace free fluid
IMPRESSION: 1. No intrauterine pregnancy identified. Trace free fluid. Normal
ovaries.
2. No intrauterine gestational sac, yolk sac, or fetal pole
identified. Differential considerations include intrauterine
pregnancy too early to be sonographically visualized, missed
abortion, or ectopic pregnancy. Followup ultrasound is recommended
in 10-14 days for further evaluation.

## 2023-07-05 DIAGNOSIS — Z419 Encounter for procedure for purposes other than remedying health state, unspecified: Secondary | ICD-10-CM | POA: Diagnosis not present

## 2023-08-05 DIAGNOSIS — Z419 Encounter for procedure for purposes other than remedying health state, unspecified: Secondary | ICD-10-CM | POA: Diagnosis not present

## 2023-08-07 IMAGING — US US OB < 14 WEEKS - US OB TV
1 series · 15 of 28 positions shown · non-contrast
Comparison: None.

CLINICAL DATA: Abdominal cramping

EXAM:
OBSTETRIC <14 WK US AND TRANSVAGINAL OB US
TECHNIQUE: Both transabdominal and transvaginal ultrasound examinations were
performed for complete evaluation of the gestation as well as the
maternal uterus, adnexal regions, and pelvic cul-de-sac.
Transvaginal technique was performed to assess early pregnancy.

[Series 1: us ob < 14 weeks - us ob tv · 15 of 57 slices shown]
[im 1/57]
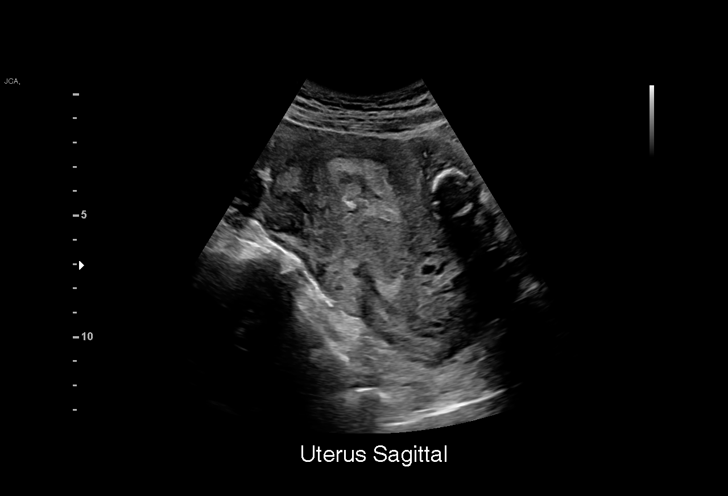
[im 5/57]
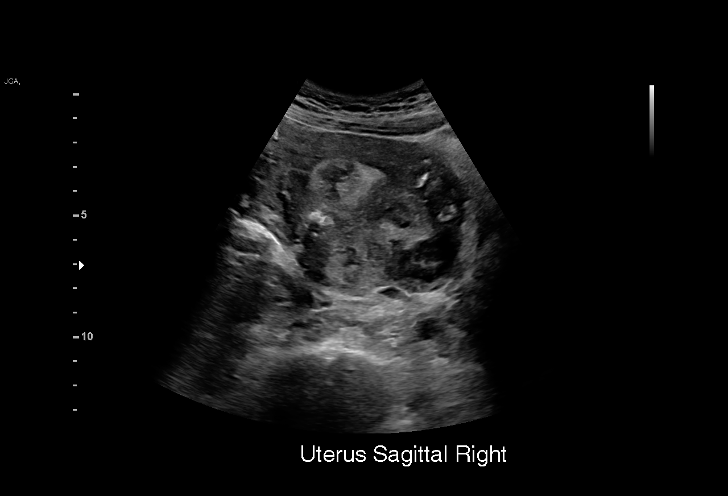
[im 9/57]
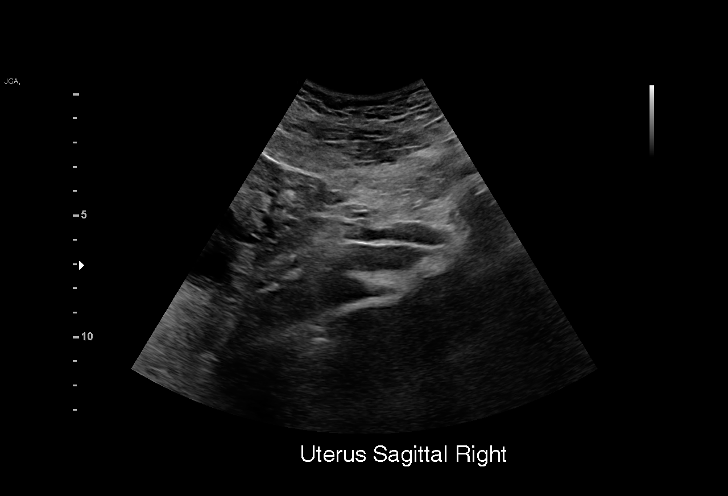
[im 13/57]
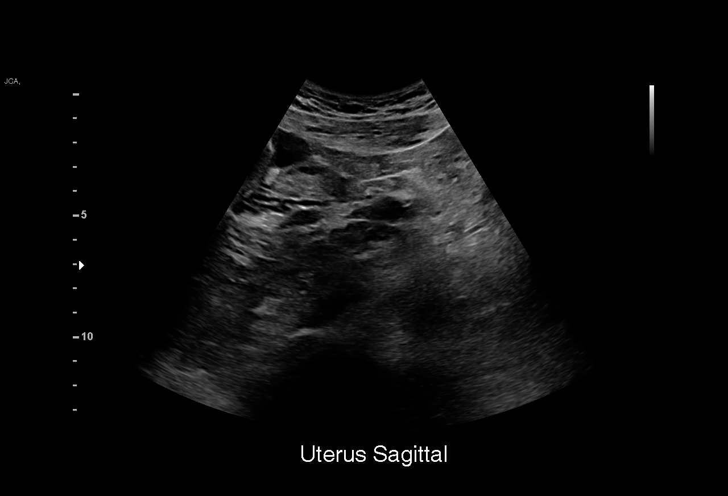
[im 17/57]
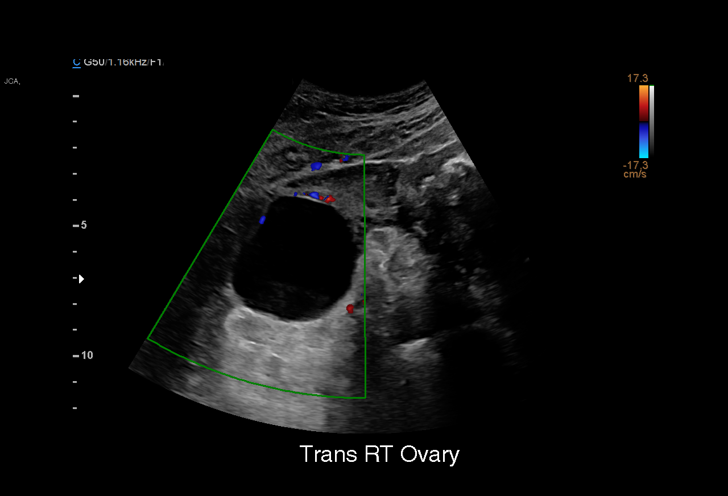
[im 21/57]
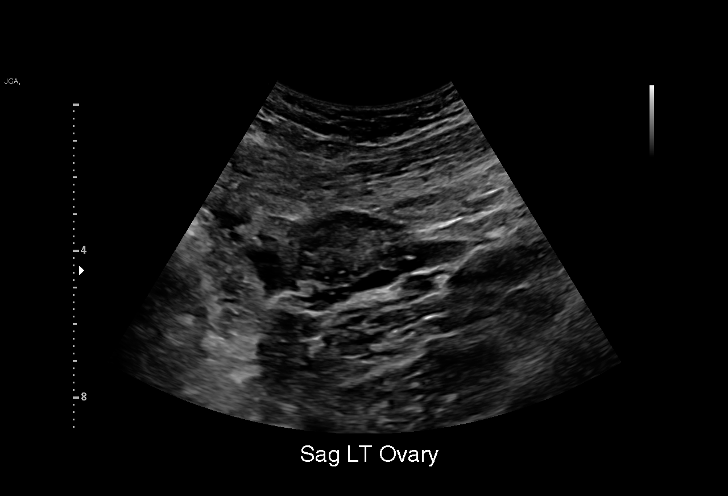
[im 25/57]
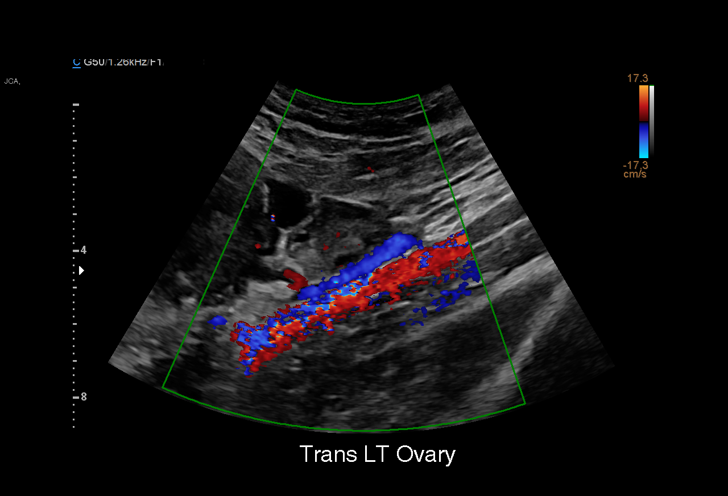
[im 30/57]
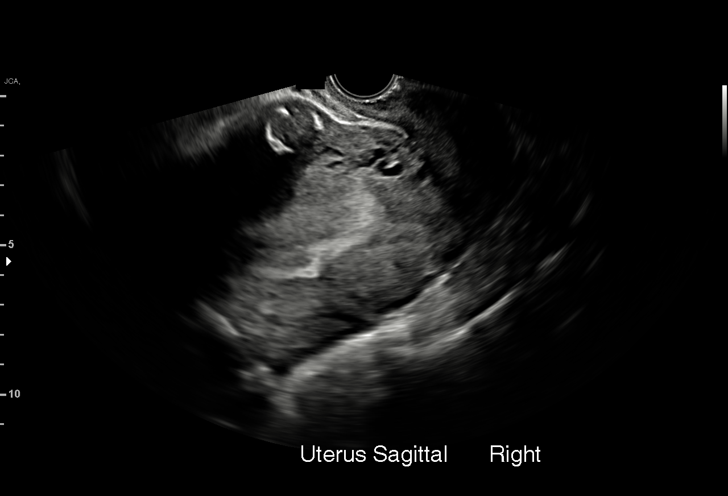
[im 32/57]
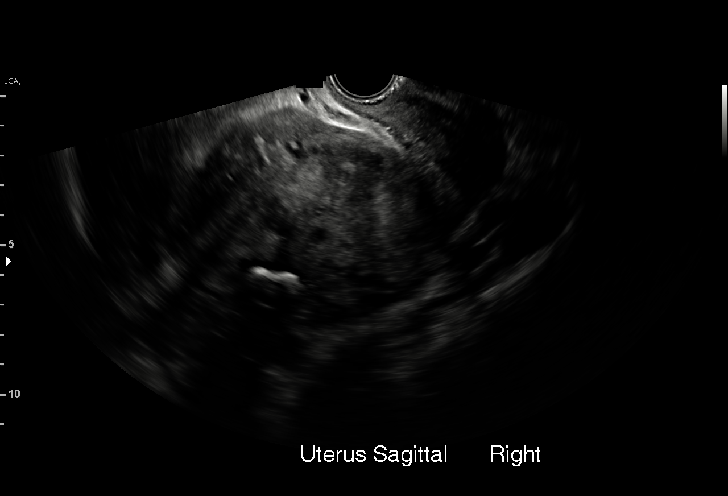
[im 36/57]
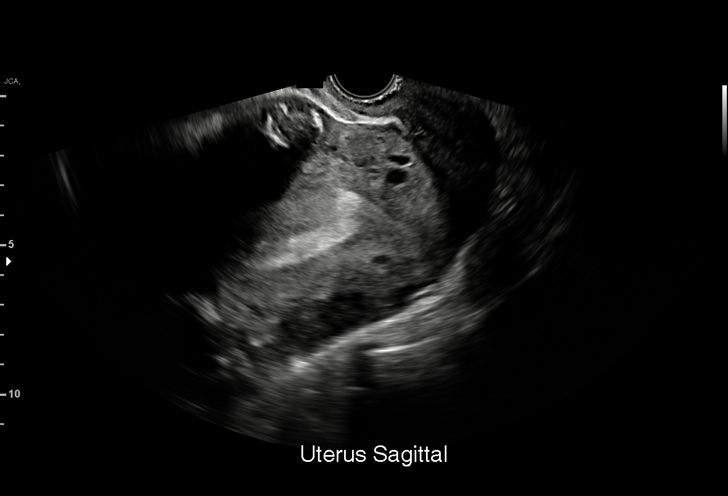
[im 40/57]
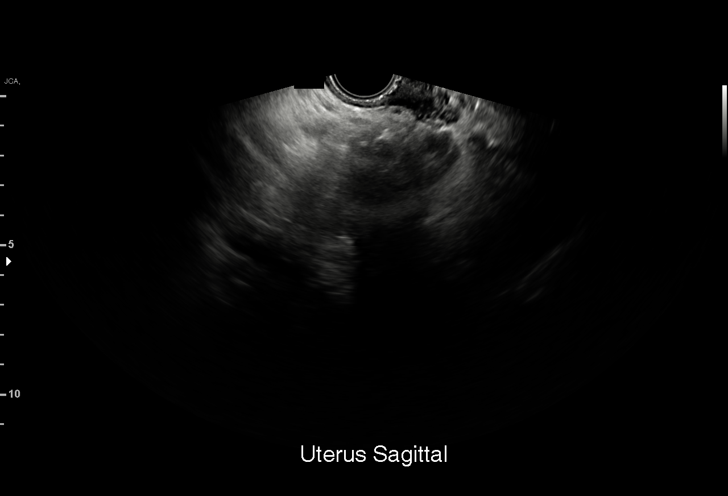
[im 44/57]
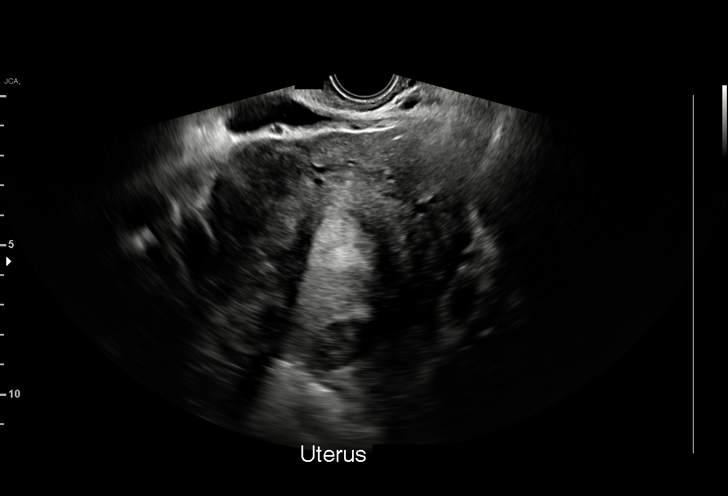
[im 48/57]
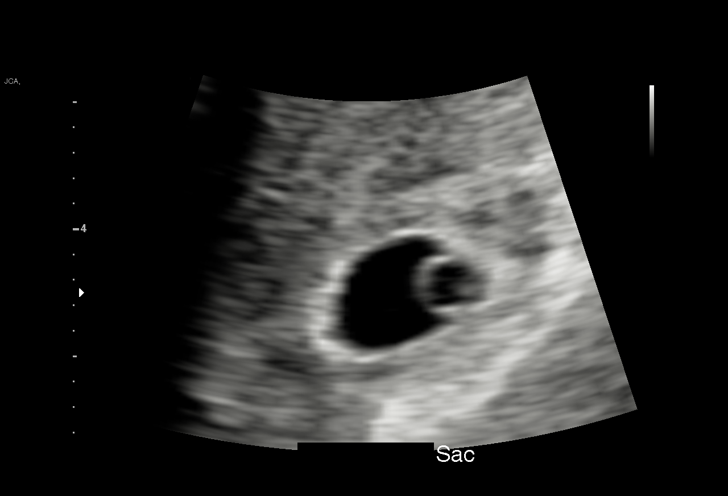
[im 52/57]
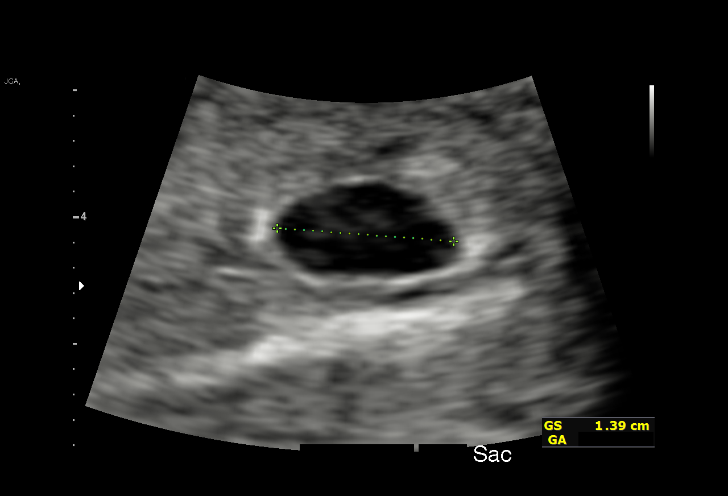
[im 57/57]
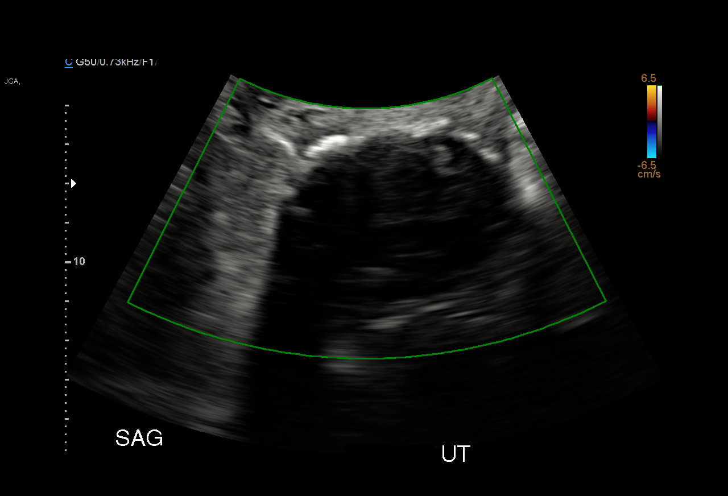

[15 of 28 positions shown; findings below may reference images not displayed]

FINDINGS: Intrauterine gestational sac: Single

Yolk sac:  Visualized.

Embryo:  Not Visualized.

MSD: 11.0 mm   5 w   5 d

Subchorionic hemorrhage:  None visualized.

Maternal uterus/adnexae: Anechoic right ovarian cyst is present
measuring 4.7 x 4.3 by 4.8 cm. Otherwise, bilateral ovaries appear
within normal limits. There is a small amount of free fluid in the
pelvis. Multiple uterine fibroids identified. The largest fibroid is
in the fundal region measuring 6.1 x 4.8 x 6.8 cm.
IMPRESSION: 1. Single intrauterine gestational sac containing yolk sac only
measuring 5 weeks 5 days by mean gestational sac diameter. No fetal
pole identified which may be within normal limits for sac size.
Recommend short term interval follow-up ultrasound to confirm
viability.
2. Uterine fibroids.
3. 4.8 cm simple right ovarian cyst. Recommend attention on
follow-up exam.

## 2023-09-05 DIAGNOSIS — Z419 Encounter for procedure for purposes other than remedying health state, unspecified: Secondary | ICD-10-CM | POA: Diagnosis not present

## 2023-10-03 DIAGNOSIS — Z419 Encounter for procedure for purposes other than remedying health state, unspecified: Secondary | ICD-10-CM | POA: Diagnosis not present

## 2023-10-16 ENCOUNTER — Ambulatory Visit: Payer: Medicaid Other | Admitting: Radiology

## 2023-10-16 ENCOUNTER — Encounter: Payer: Self-pay | Admitting: Radiology

## 2023-10-16 VITALS — BP 116/78 | HR 81 | Ht 65.0 in | Wt 194.0 lb

## 2023-10-16 DIAGNOSIS — Z01419 Encounter for gynecological examination (general) (routine) without abnormal findings: Secondary | ICD-10-CM | POA: Diagnosis not present

## 2023-10-16 DIAGNOSIS — Z30011 Encounter for initial prescription of contraceptive pills: Secondary | ICD-10-CM

## 2023-10-16 DIAGNOSIS — Z1331 Encounter for screening for depression: Secondary | ICD-10-CM | POA: Diagnosis not present

## 2023-10-16 MED ORDER — NORETHIN-ETH ESTRAD-FE BIPHAS 1 MG-10 MCG / 10 MCG PO TABS
1.0000 | ORAL_TABLET | Freq: Every day | ORAL | 4 refills | Status: AC
Start: 2023-10-16 — End: ?

## 2023-10-16 NOTE — Patient Instructions (Signed)

## 2023-10-16 NOTE — Progress Notes (Signed)
 Lisa Liu 1996/04/04 [id number]   History:  28 y.o. G4P2 presents for annual exam. Irregular heavy periods with fibroids. Interested in [facility] to help. No other gyn concerns. Up to date with pap.  Gynecologic History Patient's last menstrual period was 09/05/2023. Period Cycle (Days):  (skips periods) Period Duration (Days): 6 Period Pattern: (!) Irregular Menstrual Flow: Heavy Menstrual Control: Thin pad, Maxi pad Dysmenorrhea: (!) Mild Dysmenorrhea Symptoms: Cramping Contraception/Family planning: none Sexually active: yes Last Pap: 2023. Results were: normal   Obstetric History OB History  Gravida Para Term Preterm AB Living  4 2 2  2 2   SAB IAB Ectopic Multiple Live Births  2   0 2    # Outcome Date GA Lbr Len/2nd Weight Sex Type Anes PTL Lv  4 Term 04/12/22 [redacted]w[redacted]d / 00:06 7 lb 14 oz (3.572 kg) M Vag-Spont EPI  LIV  3 SAB 07/04/21          2 Term 05/12/17 [redacted]w[redacted]d / 01:15 6 lb 11.1 oz (3.036 kg) F Vag-Spont EPI  LIV     Birth Comments: wnl  1 SAB 07/2016 [redacted]w[redacted]d              10/16/2023   12:05 PM 03/25/2022    1:24 PM 02/25/2022    9:26 AM  Depression screen PHQ 2/9  Decreased Interest 0 1 1  Down, Depressed, Hopeless 0 0 0  PHQ - 2 Score 0 1 1  Altered sleeping  1 2  Tired, decreased energy  1 2  Change in appetite  1 1  Feeling bad or failure about yourself   0 0  Trouble concentrating  0 0  Moving slowly or fidgety/restless  0 0  Suicidal thoughts  0 0  PHQ-9 Score  4 6     The following portions of the patient's history were reviewed and updated as appropriate: allergies, current medications, past family history, past medical history, past social history, past surgical history, and problem list.  Review of Systems  All other systems reviewed and are negative.   Past medical history, past surgical history, family history and social history were all reviewed and documented in the EPIC chart.  Exam:  Vitals:   10/16/23 1204  BP: 116/78  Pulse:  81  SpO2: 99%  Weight: 194 lb (88 kg)  Height: 5\' 5"  (1.651 m)   Body mass index is 32.28 kg/m.  Physical Exam Vitals and nursing note reviewed. Exam conducted with a chaperone present.  Constitutional:      Appearance: Normal appearance. She is normal weight.  HENT:     Head: Normocephalic and atraumatic.  Neck:     Thyroid: No thyroid mass, thyromegaly or thyroid tenderness.  Cardiovascular:     Rate and Rhythm: Regular rhythm.     Heart sounds: Normal heart sounds.  Pulmonary:     Effort: Pulmonary effort is normal.     Breath sounds: Normal breath sounds.  Chest:  Breasts:    Breasts are symmetrical.     Right: Normal. No inverted nipple, mass, nipple discharge, skin change or tenderness.     Left: Normal. No inverted nipple, mass, nipple discharge, skin change or tenderness.  Abdominal:     General: Abdomen is flat. Bowel sounds are normal.     Palpations: Abdomen is soft.  Genitourinary:    General: Normal vulva.     Vagina: Normal. No vaginal discharge, bleeding or lesions.     Cervix: Normal. No discharge or lesion.  Uterus: Normal. Enlarged. Not tender.      Adnexa: Right adnexa normal and left adnexa normal.       Right: No mass, tenderness or fullness.         Left: No mass, tenderness or fullness.    Lymphadenopathy:     Upper Body:     Right upper body: No axillary adenopathy.     Left upper body: No axillary adenopathy.  Skin:    General: Skin is warm and dry.  Neurological:     Mental Status: She is alert and oriented to person, place, and time.  Psychiatric:        Mood and Affect: Mood normal.        Thought Content: Thought content normal.        Judgment: Judgment normal.      Raynelle Fanning, CMA present for exam  Assessment/Plan:   1. Well woman exam with routine gynecological exam (Primary) Pap 2026  2. Oral contraception initiation Will start with cycle this month Risks and benefits reviewed - Norethindrone-Ethinyl Estradiol-Fe Biphas  (LO LOESTRIN FE) 1 MG-10 MCG / 10 MCG tablet; Take 1 tablet by mouth daily.  Dispense: 84 tablet; Refill: 4      Return in about 1 year (around 10/15/2024) for Annual.  Arlie Solomons B WHNP-BC 12:24 PM 10/16/2023

## 2023-11-14 DIAGNOSIS — Z419 Encounter for procedure for purposes other than remedying health state, unspecified: Secondary | ICD-10-CM | POA: Diagnosis not present

## 2023-12-14 DIAGNOSIS — Z419 Encounter for procedure for purposes other than remedying health state, unspecified: Secondary | ICD-10-CM | POA: Diagnosis not present

## 2023-12-28 ENCOUNTER — Encounter (HOSPITAL_COMMUNITY): Payer: Self-pay

## 2023-12-28 ENCOUNTER — Emergency Department (HOSPITAL_COMMUNITY)

## 2023-12-28 ENCOUNTER — Emergency Department (HOSPITAL_COMMUNITY)
Admission: EM | Admit: 2023-12-28 | Discharge: 2023-12-28 | Disposition: A | Discharge status: Home / Self Care | Attending: Emergency Medicine | Admitting: Emergency Medicine

## 2023-12-28 ENCOUNTER — Other Ambulatory Visit: Payer: Self-pay

## 2023-12-28 DIAGNOSIS — A084 Viral intestinal infection, unspecified: Secondary | ICD-10-CM | POA: Diagnosis not present

## 2023-12-28 DIAGNOSIS — D259 Leiomyoma of uterus, unspecified: Secondary | ICD-10-CM | POA: Diagnosis not present

## 2023-12-28 DIAGNOSIS — R109 Unspecified abdominal pain: Secondary | ICD-10-CM | POA: Diagnosis not present

## 2023-12-28 DIAGNOSIS — R1084 Generalized abdominal pain: Secondary | ICD-10-CM | POA: Diagnosis present

## 2023-12-28 LAB — URINALYSIS, W/ REFLEX TO CULTURE (INFECTION SUSPECTED)
Bilirubin Urine: NEGATIVE
Glucose, UA: 50 mg/dL — AB
Hgb urine dipstick: NEGATIVE
Ketones, ur: NEGATIVE mg/dL
Leukocytes,Ua: NEGATIVE
Nitrite: NEGATIVE
Protein, ur: 30 mg/dL — AB
Specific Gravity, Urine: 1.029 (ref 1.005–1.030)
pH: 5 (ref 5.0–8.0)

## 2023-12-28 LAB — COMPREHENSIVE METABOLIC PANEL WITH GFR
ALT: 21 U/L (ref 0–44)
AST: 24 U/L (ref 15–41)
Albumin: 3.8 g/dL (ref 3.5–5.0)
Alkaline Phosphatase: 51 U/L (ref 38–126)
Anion gap: 10 (ref 5–15)
BUN: 10 mg/dL (ref 6–20)
CO2: 22 mmol/L (ref 22–32)
Calcium: 9.4 mg/dL (ref 8.9–10.3)
Chloride: 105 mmol/L (ref 98–111)
Creatinine, Ser: 0.85 mg/dL (ref 0.44–1.00)
GFR, Estimated: >60 mL/min (ref >60)
Glucose, Bld: 101 mg/dL — ABNORMAL HIGH (ref 70–99)
Potassium: 3.7 mmol/L (ref 3.5–5.1)
Sodium: 137 mmol/L (ref 135–145)
Total Bilirubin: 0.9 mg/dL (ref 0.0–1.2)
Total Protein: 7.3 g/dL (ref 6.5–8.1)

## 2023-12-28 LAB — LIPASE, BLOOD: Lipase: 30 U/L (ref 11–51)

## 2023-12-28 LAB — CBC WITH DIFFERENTIAL/PLATELET
Abs Immature Granulocytes: 0.05 10*3/uL (ref 0.00–0.07)
Basophils Absolute: 0 10*3/uL (ref 0.0–0.1)
Basophils Relative: 0 %
Eosinophils Absolute: 0.1 10*3/uL (ref 0.0–0.5)
Eosinophils Relative: 1 %
HCT: 39.9 % (ref 36.0–46.0)
Hemoglobin: 12.9 g/dL (ref 12.0–15.0)
Immature Granulocytes: 1 %
Lymphocytes Relative: 17 %
Lymphs Abs: 0.9 10*3/uL (ref 0.7–4.0)
MCH: 28 pg (ref 26.0–34.0)
MCHC: 32.3 g/dL (ref 30.0–36.0)
MCV: 86.7 fL (ref 80.0–100.0)
Monocytes Absolute: 0.3 10*3/uL (ref 0.1–1.0)
Monocytes Relative: 6 %
Neutro Abs: 3.9 10*3/uL (ref 1.7–7.7)
Neutrophils Relative %: 75 %
Platelets: 330 10*3/uL (ref 150–400)
RBC: 4.6 MIL/uL (ref 3.87–5.11)
RDW: 15.2 % (ref 11.5–15.5)
WBC: 5.3 10*3/uL (ref 4.0–10.5)
nRBC: 0 % (ref 0.0–0.2)

## 2023-12-28 LAB — HCG, SERUM, QUALITATIVE: Preg, Serum: NEGATIVE

## 2023-12-28 MED ORDER — IOHEXOL 350 MG/ML SOLN
75.0000 mL | Freq: Once | INTRAVENOUS | Status: AC | PRN
  Administered 2023-12-28: 75 mL via INTRAVENOUS

## 2023-12-28 MED ORDER — MORPHINE SULFATE (PF) 4 MG/ML IV SOLN
4.0000 mg | Freq: Once | INTRAVENOUS | Status: AC
  Administered 2023-12-28: 4 mg via INTRAVENOUS
  Filled 2023-12-28: qty 1

## 2023-12-28 MED ORDER — PANTOPRAZOLE SODIUM 40 MG IV SOLR
40.0000 mg | Freq: Once | INTRAVENOUS | Status: AC
  Administered 2023-12-28: 40 mg via INTRAVENOUS
  Filled 2023-12-28: qty 10

## 2023-12-28 MED ORDER — SODIUM CHLORIDE 0.9 % IV BOLUS
1000.0000 mL | Freq: Once | INTRAVENOUS | Status: AC
  Administered 2023-12-28: 1000 mL via INTRAVENOUS

## 2023-12-28 MED ORDER — ONDANSETRON HCL 4 MG/2ML IJ SOLN
4.0000 mg | Freq: Four times a day (QID) | INTRAMUSCULAR | Status: DC | PRN
  Administered 2023-12-28: 4 mg via INTRAVENOUS
  Filled 2023-12-28: qty 2

## 2023-12-28 MED ORDER — ONDANSETRON 4 MG PO TBDP: 4.0000 mg | ORAL_TABLET | Freq: Three times a day (TID) | ORAL | 0 refills | Status: AC | PRN

## 2023-12-28 NOTE — ED Triage Notes (Signed)
 Pt comes in for generalized abd pain since this AM. Reports she has been having NVD. Denies blood in stool.

## 2023-12-28 NOTE — Discharge Instructions (Signed)
 It was a pleasure caring for you today. Please follow up with your primary care provider. Seek emergency care if experiencing any new or worsening symptoms.

## 2023-12-28 NOTE — ED Provider Notes (Signed)
 [place] [facility] Provider Note   CSN: [id number] Arrival date & time: 12/28/23  0750     History  Chief Complaint  Patient presents with   Abdominal Pain    Lisa Liu is a 28 y.o. female with PMHx uterine fibroids, anemia, ovarian cysts who presents to ED concerned for generalized abdominal pain, nausea, vomiting, and diarrhea since 5AM this morning. Patient endorsing multiple recent sick contacts with her husband and 2 kids who recently had similar symptoms. Patient took pepto-bismol this morning but then immediately vomited it back up. LMP 2 weeks ago. Patient denies fever, hematemesis, hematochezia, hematuria, dysuria. Denies any recent suspicious food/water intake.  HPI     Home Medications Prior to Admission medications   Medication Sig Start Date End Date Taking? Authorizing Provider  ondansetron  (ZOFRAN -ODT) 4 MG disintegrating tablet Take 1 tablet (4 mg total) by mouth every 8 (eight) hours as needed for nausea. 12/28/23  Yes Jodene Polyak, Twila Gale F, PA-C  Norethindrone-Ethinyl Estradiol-Fe Biphas (LO LOESTRIN FE) 1 MG-10 MCG / 10 MCG tablet Take 1 tablet by mouth daily. 10/16/23   Chrzanowski, Jami B, NP      Allergies    Patient has no known allergies.    Review of Systems   Review of Systems  Gastrointestinal:  Positive for abdominal pain.    Physical Exam Updated Vital Signs BP 101/63   Pulse (!) 57   Temp 97.6 F (36.4 C)   Resp 19   Ht 5\' 5"  (1.651 m)   Wt 81.6 kg   SpO2 100%   BMI 29.95 kg/m  Physical Exam Vitals and nursing note reviewed.  Constitutional:      General: She is not in acute distress.    Appearance: She is not ill-appearing or toxic-appearing.  HENT:     Head: Normocephalic and atraumatic.     Mouth/Throat:     Mouth: Mucous membranes are moist.  Eyes:     General: No scleral icterus.       Right eye: No discharge.        Left eye: No discharge.     Conjunctiva/sclera:  Conjunctivae normal.  Cardiovascular:     Rate and Rhythm: Normal rate and regular rhythm.     Pulses: Normal pulses.     Heart sounds: Normal heart sounds. No murmur heard. Pulmonary:     Effort: Pulmonary effort is normal. No respiratory distress.     Breath sounds: Normal breath sounds. No wheezing, rhonchi or rales.  Abdominal:     General: Abdomen is flat. Bowel sounds are normal. There is no distension.     Palpations: Abdomen is soft. There is no mass.     Tenderness: There is abdominal tenderness.     Comments: Generalized abdominal tenderness to palpation  Musculoskeletal:     Right lower leg: No edema.     Left lower leg: No edema.  Skin:    General: Skin is warm and dry.     Findings: No rash.  Neurological:     General: No focal deficit present.     Mental Status: She is alert and oriented to person, place, and time. Mental status is at baseline.  Psychiatric:        Mood and Affect: Mood normal.        Behavior: Behavior normal.     ED Results / Procedures / Treatments   Labs (all labs ordered are listed, but only abnormal results are displayed) Labs  Reviewed  COMPREHENSIVE METABOLIC PANEL WITH GFR - Abnormal; Notable for the following components:      Result Value   Glucose, Bld 101 (*)    All other components within normal limits  URINALYSIS, W/ REFLEX TO CULTURE (INFECTION SUSPECTED) - Abnormal; Notable for the following components:   APPearance CLOUDY (*)    Glucose, UA 50 (*)    Protein, ur 30 (*)    Bacteria, UA RARE (*)    All other components within normal limits  LIPASE, BLOOD  CBC WITH DIFFERENTIAL/PLATELET  HCG, SERUM, QUALITATIVE    EKG None  Radiology CT ABDOMEN PELVIS W CONTRAST Result Date: 12/28/2023 CLINICAL DATA:  Abdominal pain. EXAM: CT ABDOMEN AND PELVIS WITH CONTRAST TECHNIQUE: Multidetector CT imaging of the abdomen and pelvis was performed using the standard protocol following bolus administration of intravenous contrast.  RADIATION DOSE REDUCTION: This exam was performed according to the departmental dose-optimization program which includes automated exposure control, adjustment of the mA and/or kV according to patient size and/or use of iterative reconstruction technique. CONTRAST:  75mL OMNIPAQUE IOHEXOL 350 MG/ML SOLN COMPARISON:  None Available. FINDINGS: Lower chest: No acute abnormality. No pleural or pericardial effusions. Hepatobiliary: No focal liver abnormality is seen. No gallstones, gallbladder wall thickening, or biliary dilatation. Pancreas: Unremarkable. No pancreatic ductal dilatation or surrounding inflammatory changes. Spleen: Normal in size without focal abnormality. Adrenals/Urinary Tract: Adrenal glands are unremarkable. Kidneys are normal, without renal calculi, focal lesion, or hydronephrosis. Bladder is unremarkable. Stomach/Bowel: Stomach is within normal limits. Appendix appears normal. No evidence of bowel wall thickening, distention, or inflammatory changes. Vascular/Lymphatic: No significant vascular findings are present. No enlarged abdominal or pelvic lymph nodes. Reproductive: Multiple calcified uterine fibroids. No adnexal pathology. Other: No abdominal wall hernia or abnormality. No abdominopelvic ascites. Musculoskeletal: No acute or significant osseous findings. IMPRESSION: 1. Calcified uterine fibroids. 2. No acute abdominal or pelvic pathology identified. Electronically Signed   By: Sydell Eva M.D.   On: 12/28/2023 10:38    Procedures Procedures    Medications Ordered in ED Medications  ondansetron  (ZOFRAN ) injection 4 mg (4 mg Intravenous Given 12/28/23 0900)  morphine (PF) 4 MG/ML injection 4 mg (4 mg Intravenous Given 12/28/23 0900)  pantoprazole  (PROTONIX ) injection 40 mg (40 mg Intravenous Given 12/28/23 0900)  sodium chloride  0.9 % bolus 1,000 mL (0 mLs Intravenous Stopped 12/28/23 1058)  iohexol (OMNIPAQUE) 350 MG/ML injection 75 mL (75 mLs Intravenous Contrast Given 12/28/23  1029)    ED Course/ Medical Decision Making/ A&P                                 Medical Decision Making Amount and/or Complexity of Data Reviewed Labs: ordered. Radiology: ordered.  Risk Prescription drug management.    This patient presents to the ED for concern of abdominal pain, this involves an extensive number of treatment options, and is a complaint that carries with it a high risk of complications and morbidity.  The differential diagnosis includes gastroenteritis, colitis, small bowel obstruction, appendicitis, cholecystitis, pancreatitis, nephrolithiasis, UTI, pyleonephritis, ruptured ectopic pregnancy, PID, ovarian torsion.   Co morbidities that complicate the patient evaluation  uterine fibroids, anemia, ovarian cysts    Additional history obtained:  Dr. Grayland Le PCP   Problem List / ED Course / Critical interventions / Medication management  Patient presented for abdominal pain, nausea, vomiting, diarrhea starting around 4 hours ago. Multiple family members with same symptoms a couple of days  ago. Physical exam with non-focal abdominal tenderness to palpation.  Rest of physical exam reassuring.  Patient afebrile with stable vitals. I Ordered, and personally interpreted labs.  UA not concerning for infection.  hCG negative.  CMP reassuring.  Lipase within normal limits.  CBC without leukocytosis or anemia. I ordered imaging studies including CT Abd/Pelvis with contrast: evaluate for structural/surgical etiology of patients' severe abdominal pain. I independently visualized and interpreted imaging and I agree with the radiologist interpretation of no acute process. Shared all results with patient.  Answered all questions. Patient now tolerating PO. It appears that patient's symptoms are d/t viral gastroenteritis. Recommended following up with PCP. Patient verbalized understanding of plan. Will prescribe zofran . Patient verbalizes understanding of plan.  I have reviewed the  patients home medicines and have made adjustments as needed The patient has been appropriately medically screened and/or stabilized in the ED. I have low suspicion for any other emergent medical condition which would require further screening, evaluation or treatment in the ED or require inpatient management. At time of discharge the patient is hemodynamically stable and in no acute distress. I have discussed work-up results and diagnosis with patient and answered all questions. Patient is agreeable with discharge plan. We discussed strict return precautions for returning to the emergency department and they verbalized understanding.     Social Determinants of Health:  none          Final Clinical Impression(s) / ED Diagnoses Final diagnoses:  Viral gastroenteritis    Rx / DC Orders ED Discharge Orders          Ordered    ondansetron  (ZOFRAN -ODT) 4 MG disintegrating tablet  Every 8 hours PRN        12/28/23 1115              [place], [place] 12/28/23 1117    Nicklas Barns, MD 12/30/23 [phone]

## 2024-01-14 DIAGNOSIS — Z419 Encounter for procedure for purposes other than remedying health state, unspecified: Secondary | ICD-10-CM | POA: Diagnosis not present

## 2024-02-13 DIAGNOSIS — Z419 Encounter for procedure for purposes other than remedying health state, unspecified: Secondary | ICD-10-CM | POA: Diagnosis not present

## 2024-03-15 DIAGNOSIS — Z419 Encounter for procedure for purposes other than remedying health state, unspecified: Secondary | ICD-10-CM | POA: Diagnosis not present

## 2024-04-28 ENCOUNTER — Ambulatory Visit: Admitting: Family Medicine

## 2024-07-22 ENCOUNTER — Encounter (HOSPITAL_COMMUNITY): Payer: Self-pay

## 2024-07-22 ENCOUNTER — Other Ambulatory Visit: Payer: Self-pay

## 2024-07-22 ENCOUNTER — Emergency Department (HOSPITAL_COMMUNITY)
Admission: EM | Admit: 2024-07-22 | Discharge: 2024-07-22 | Disposition: A | Discharge status: Home / Self Care | Attending: Emergency Medicine | Admitting: Emergency Medicine

## 2024-07-22 DIAGNOSIS — M25512 Pain in left shoulder: Secondary | ICD-10-CM | POA: Insufficient documentation

## 2024-07-22 DIAGNOSIS — M79622 Pain in left upper arm: Secondary | ICD-10-CM | POA: Diagnosis not present

## 2024-07-22 DIAGNOSIS — Y92410 Unspecified street and highway as the place of occurrence of the external cause: Secondary | ICD-10-CM | POA: Diagnosis not present

## 2024-07-22 MED ORDER — IBUPROFEN 800 MG PO TABS
800.0000 mg | ORAL_TABLET | Freq: Once | ORAL | Status: AC
  Administered 2024-07-22: 800 mg via ORAL
  Filled 2024-07-22: qty 1
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
## 2024-07-22 NOTE — Discharge Instructions (Addendum)
 Patient today was reassuring.  Suspect she may have some muscle soreness or inflammation in the area around your shoulder and back of your arm.  Recommend ibuprofen  and you can also take Tylenol  for pain.  Also recommend icing your shoulder and arm 3-4 times a day for 20 minutes each time for the next 4 days.  Please follow-up with your PCP.
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
## 2024-10-19 ENCOUNTER — Ambulatory Visit: Admitting: Radiology
